# Patient Record
Sex: Male | Born: 1935 | Race: White | Hispanic: No | Marital: Married | Smoking: Former smoker
Health system: Southern US, Community
[De-identification: ages and names within clinical notes are randomized; demographics above are authoritative.]

## PROBLEM LIST (undated history)

## (undated) DIAGNOSIS — C159 Malignant neoplasm of esophagus, unspecified: Secondary | ICD-10-CM

## (undated) DIAGNOSIS — K219 Gastro-esophageal reflux disease without esophagitis: Secondary | ICD-10-CM

## (undated) DIAGNOSIS — H2701 Aphakia, right eye: Secondary | ICD-10-CM

## (undated) DIAGNOSIS — N400 Enlarged prostate without lower urinary tract symptoms: Secondary | ICD-10-CM

## (undated) DIAGNOSIS — E785 Hyperlipidemia, unspecified: Secondary | ICD-10-CM

## (undated) DIAGNOSIS — H3530 Unspecified macular degeneration: Secondary | ICD-10-CM

## (undated) DIAGNOSIS — H409 Unspecified glaucoma: Secondary | ICD-10-CM

## (undated) DIAGNOSIS — I771 Stricture of artery: Secondary | ICD-10-CM

## (undated) DIAGNOSIS — Z86718 Personal history of other venous thrombosis and embolism: Secondary | ICD-10-CM

## (undated) DIAGNOSIS — M1990 Unspecified osteoarthritis, unspecified site: Secondary | ICD-10-CM

## (undated) DIAGNOSIS — Z8673 Personal history of transient ischemic attack (TIA), and cerebral infarction without residual deficits: Secondary | ICD-10-CM

## (undated) DIAGNOSIS — I10 Essential (primary) hypertension: Secondary | ICD-10-CM

## (undated) DIAGNOSIS — I639 Cerebral infarction, unspecified: Secondary | ICD-10-CM

## (undated) DIAGNOSIS — Z9289 Personal history of other medical treatment: Secondary | ICD-10-CM

## (undated) DIAGNOSIS — M109 Gout, unspecified: Secondary | ICD-10-CM

## (undated) DIAGNOSIS — I2699 Other pulmonary embolism without acute cor pulmonale: Secondary | ICD-10-CM

## (undated) DIAGNOSIS — C801 Malignant (primary) neoplasm, unspecified: Secondary | ICD-10-CM

## (undated) HISTORY — DX: Unspecified glaucoma: H40.9

## (undated) HISTORY — DX: Benign prostatic hyperplasia without lower urinary tract symptoms: N40.0

## (undated) HISTORY — DX: Personal history of other medical treatment: Z92.89

## (undated) HISTORY — DX: Aphakia, right eye: H27.01

## (undated) HISTORY — DX: Essential (primary) hypertension: I10

## (undated) HISTORY — DX: Gastro-esophageal reflux disease without esophagitis: K21.9

## (undated) HISTORY — PX: CATARACT EXTRACTION: SUR2

## (undated) HISTORY — DX: Malignant neoplasm of esophagus, unspecified: C15.9

## (undated) HISTORY — DX: Cerebral infarction, unspecified: I63.9

## (undated) HISTORY — DX: Hyperlipidemia, unspecified: E78.5

## (undated) HISTORY — DX: Stricture of artery: I77.1

## (undated) HISTORY — DX: Personal history of other venous thrombosis and embolism: Z86.718

## (undated) HISTORY — DX: Unspecified macular degeneration: H35.30

## (undated) HISTORY — PX: FRACTURE SURGERY: SHX138

## (undated) HISTORY — DX: Personal history of transient ischemic attack (TIA), and cerebral infarction without residual deficits: Z86.73

## (undated) HISTORY — DX: Gout, unspecified: M10.9

## (undated) HISTORY — PX: MOHS SURGERY: SHX181

---

## 1998-03-31 ENCOUNTER — Ambulatory Visit (HOSPITAL_COMMUNITY): Admission: RE | Admit: 1998-03-31 | Discharge: 1998-03-31 | Payer: Self-pay | Admitting: Family Medicine

## 1998-10-03 ENCOUNTER — Ambulatory Visit (HOSPITAL_COMMUNITY): Admission: RE | Admit: 1998-10-03 | Discharge: 1998-10-03 | Payer: Self-pay | Admitting: Gastroenterology

## 2000-06-03 HISTORY — PX: COLONOSCOPY: SHX174

## 2006-02-13 ENCOUNTER — Other Ambulatory Visit: Payer: Self-pay

## 2006-02-13 ENCOUNTER — Inpatient Hospital Stay: Payer: Self-pay | Admitting: Internal Medicine

## 2009-09-02 DIAGNOSIS — Z9289 Personal history of other medical treatment: Secondary | ICD-10-CM

## 2009-09-02 HISTORY — DX: Personal history of other medical treatment: Z92.89

## 2012-12-01 ENCOUNTER — Encounter: Payer: Self-pay | Admitting: *Deleted

## 2012-12-02 ENCOUNTER — Encounter: Payer: Self-pay | Admitting: Cardiovascular Disease

## 2012-12-03 ENCOUNTER — Encounter: Payer: Self-pay | Admitting: Cardiovascular Disease

## 2012-12-03 ENCOUNTER — Ambulatory Visit (INDEPENDENT_AMBULATORY_CARE_PROVIDER_SITE_OTHER): Payer: Medicare Other | Admitting: Cardiovascular Disease

## 2012-12-03 VITALS — BP 140/82 | HR 51 | Ht 74.0 in | Wt 204.0 lb

## 2012-12-03 DIAGNOSIS — R0989 Other specified symptoms and signs involving the circulatory and respiratory systems: Secondary | ICD-10-CM

## 2012-12-03 DIAGNOSIS — E785 Hyperlipidemia, unspecified: Secondary | ICD-10-CM

## 2012-12-03 DIAGNOSIS — I739 Peripheral vascular disease, unspecified: Secondary | ICD-10-CM

## 2012-12-03 DIAGNOSIS — I10 Essential (primary) hypertension: Secondary | ICD-10-CM | POA: Insufficient documentation

## 2012-12-03 DIAGNOSIS — I771 Stricture of artery: Secondary | ICD-10-CM | POA: Insufficient documentation

## 2012-12-03 NOTE — Patient Instructions (Addendum)
  We will see you back in follow up in 1 year  Dr Allyson Sabal has ordered carotid and upper extremity dopplers on Monday

## 2012-12-03 NOTE — Assessment & Plan Note (Signed)
On antihypertensive medications with a well-controlled blood pressure though I'm unsure how accurate this is given his subclavian artery stenosis

## 2012-12-03 NOTE — Assessment & Plan Note (Signed)
Previous Dopplers performed 10/30/11 revealed what appeared to be an occluded left subclavian with retrograde left vertebral filling and high-grade right subclavian artery stenosis. He had mild carotid disease with less than 50%. He is neurologically asymptomatic and denies upper extremity claudication.

## 2012-12-03 NOTE — Assessment & Plan Note (Signed)
On statin therapy and niacin followed by his primary care physician

## 2012-12-03 NOTE — Progress Notes (Signed)
12/03/2012 Marcus [name]   [birth year]  [id number]  Primary Physician Pcp Not In System Primary Cardiologist: Runell Gess MD Marcus [name]   HPI:  The patient is a 79 year old, mild to moderately overweight, married, Caucasian male father of 4 stepchildren and 3 biologic children, grandfather to multiple grandchildren who I last saw a year ago. He has a 90-pack-year history of tobacco abuse, having quit 17 years ago, hypertension and hyperlipidemia, as well as a family history of heart disease. He has never had a heart attack or stroke and denies chest pain or shortness of breath. A Myoview stress test performed in our office September 26, 2009 was nonischemic. Dopplers revealed mild left ICA stenosis which has remained stable, occluded left subclavian artery with retrograde left vertebral filling, and moderately severe right subclavian artery stenosis. He has no symptoms of upper extremity claudication or subclavian steal symptoms. He does take his blood pressure in his right arm. Since I last saw him in the office 11/04/11 he has been completely asymptomatic. He specifically denies chest pain, shortness of breath, dizziness or upper extremity  Claudication.      Current Outpatient Prescriptions  Medication Sig Dispense Refill  . aspirin 81 MG tablet Take 81 mg by mouth daily.      . cyanocobalamin 500 MCG tablet Take 500 mcg by mouth daily.      . dorzolamide (TRUSOPT) 2 % ophthalmic solution Place 1 drop into both eyes 2 (two) times daily.      Marland Kitchen latanoprost (XALATAN) 0.005 % ophthalmic solution Place 1 drop into both eyes at bedtime.      . magnesium oxide (MAG-OX) 400 MG tablet Take 400 mg by mouth daily.      . niacin (NIASPAN) 1000 MG CR tablet Take 500 mg by mouth every other day.       . simvastatin (ZOCOR) 40 MG tablet Take 20 mg by mouth every other day.       . terazosin (HYTRIN) 10 MG capsule Take 10 mg by mouth at bedtime.      . Vitamin D, Ergocalciferol, (DRISDOL)  50000 UNITS CAPS Take 50,000 Units by mouth every 7 (seven) days.       No current facility-administered medications for this visit.    Allergies  Allergen Reactions  . Codeine Other (See Comments)    Swollen tongue, numb around mouth, slurred speech    History   Social History  . Marital Status: Married    Spouse Name: N/A    Number of Children: N/A  . Years of Education: N/A   Occupational History  . Not on file.   Social History Main Topics  . Smoking status: Former Smoker    Quit date: 06/03/1993  . Smokeless tobacco: Never Used  . Alcohol Use: No  . Drug Use: No  . Sexually Active: Not on file   Other Topics Concern  . Not on file   Social History Narrative  . No narrative on file     Review of Systems: General: negative for chills, fever, night sweats or weight changes.  Cardiovascular: negative for chest pain, dyspnea on exertion, edema, orthopnea, palpitations, paroxysmal nocturnal dyspnea or shortness of breath Dermatological: negative for rash Respiratory: negative for cough or wheezing Urologic: negative for hematuria Abdominal: negative for nausea, vomiting, diarrhea, bright red blood per rectum, melena, or hematemesis Neurologic: negative for visual changes, syncope, or dizziness All other systems reviewed and are otherwise negative except as noted above.  Blood pressure 140/82, pulse 51, height 6\' 2"  (1.88 m), weight 204 lb (92.534 kg).  General appearance: alert and no distress Neck: no adenopathy, no JVD, supple, symmetrical, trachea midline, thyroid not enlarged, symmetric, no tenderness/mass/nodules and soft right carotid and subclavian bruit Lungs: clear to auscultation bilaterally Heart: regular rate and rhythm, S1, S2 normal, no murmur, click, rub or gallop Extremities: extremities normal, atraumatic, no cyanosis or edema  EKG sinus bradycardia at 51 T wave inversion in leads 1 and L. Suggesting lateral ischemia unchanged from prior  EKGs  ASSESSMENT AND PLAN:   Subclavian artery stenosis Previous Dopplers performed 10/30/11 revealed what appeared to be an occluded left subclavian with retrograde left vertebral filling and high-grade right subclavian artery stenosis. He had mild carotid disease with less than 50%. He is neurologically asymptomatic and denies upper extremity claudication.  Essential hypertension On antihypertensive medications with a well-controlled blood pressure though I'm unsure how accurate this is given his subclavian artery stenosis  Hyperlipidemia On statin therapy and niacin followed by his primary care physician      Runell Gess MD [facility], [facility] 12/03/2012 11:02 AM

## 2012-12-07 ENCOUNTER — Ambulatory Visit (HOSPITAL_COMMUNITY)
Admission: RE | Admit: 2012-12-07 | Discharge: 2012-12-07 | Disposition: A | Discharge status: Home / Self Care | Payer: Medicare Other | Source: Ambulatory Visit | Attending: Cardiovascular Disease | Admitting: Cardiovascular Disease

## 2012-12-07 DIAGNOSIS — I771 Stricture of artery: Secondary | ICD-10-CM

## 2012-12-07 DIAGNOSIS — I739 Peripheral vascular disease, unspecified: Secondary | ICD-10-CM

## 2012-12-07 DIAGNOSIS — R0989 Other specified symptoms and signs involving the circulatory and respiratory systems: Secondary | ICD-10-CM

## 2012-12-07 NOTE — Progress Notes (Signed)
Upper Extremity Arterial Duplex Completed. °Marcus [name] ° °

## 2012-12-11 ENCOUNTER — Telehealth: Payer: Self-pay | Admitting: *Deleted

## 2012-12-11 NOTE — Telephone Encounter (Signed)
Message copied by Barrie Dunker on Fri Dec 11, 2012  2:17 PM ------      Message from: Cleon Gustin D      Created: Fri Dec 11, 2012  8:15 AM      Regarding: Carotid test       Patient has been scheduled for a carotid test. He thinks that the Upper Arterial doppler is the same test.      He would like to speak with Dr. Allyson Sabal or his nurse to discuss if he really needs to have the carotid.            Ebony ------

## 2012-12-11 NOTE — Telephone Encounter (Signed)
Return call to patient, no answer leave message for patient to call back.

## 2013-02-10 ENCOUNTER — Encounter (HOSPITAL_COMMUNITY): Payer: Medicare Other

## 2013-03-29 ENCOUNTER — Encounter (HOSPITAL_COMMUNITY): Payer: Medicare Other

## 2013-05-03 ENCOUNTER — Ambulatory Visit (HOSPITAL_COMMUNITY)
Admission: RE | Admit: 2013-05-03 | Discharge: 2013-05-03 | Disposition: A | Discharge status: Home / Self Care | Payer: Medicare Other | Source: Ambulatory Visit | Attending: Cardiovascular Disease | Admitting: Cardiovascular Disease

## 2013-05-03 DIAGNOSIS — R0989 Other specified symptoms and signs involving the circulatory and respiratory systems: Secondary | ICD-10-CM

## 2013-05-03 DIAGNOSIS — I739 Peripheral vascular disease, unspecified: Secondary | ICD-10-CM

## 2013-05-03 DIAGNOSIS — I6529 Occlusion and stenosis of unspecified carotid artery: Secondary | ICD-10-CM

## 2013-05-03 NOTE — Progress Notes (Signed)
Carotid Duplex Completed. Mya Suell, BS, RDMS, RVT  

## 2013-05-10 ENCOUNTER — Encounter: Payer: Self-pay | Admitting: *Deleted

## 2013-05-10 ENCOUNTER — Telehealth: Payer: Self-pay | Admitting: *Deleted

## 2013-05-10 DIAGNOSIS — I6529 Occlusion and stenosis of unspecified carotid artery: Secondary | ICD-10-CM

## 2013-05-10 NOTE — Telephone Encounter (Signed)
Order placed for repeat carotid doppler in 1 year 

## 2013-05-10 NOTE — Telephone Encounter (Signed)
Message copied by Marella Bile on Mon May 10, 2013 12:54 PM ------      Message from: Runell Gess      Created: Sun May 09, 2013  7:02 PM       No change from prior study. Repeat in 12 months. ------

## 2013-09-14 ENCOUNTER — Other Ambulatory Visit: Payer: Self-pay

## 2013-10-12 ENCOUNTER — Other Ambulatory Visit: Payer: Self-pay | Admitting: Dermatology

## 2014-08-03 ENCOUNTER — Other Ambulatory Visit: Payer: Self-pay | Admitting: Dermatology

## 2014-12-24 ENCOUNTER — Inpatient Hospital Stay (HOSPITAL_COMMUNITY)
Admission: AD | Admit: 2014-12-24 | Discharge: 2014-12-26 | DRG: 066 | Disposition: A | Discharge status: Home / Self Care | Payer: Medicare Other | Source: Other Acute Inpatient Hospital | Attending: Internal Medicine | Admitting: Internal Medicine

## 2014-12-24 ENCOUNTER — Encounter (HOSPITAL_COMMUNITY): Payer: Self-pay | Admitting: *Deleted

## 2014-12-24 ENCOUNTER — Inpatient Hospital Stay (HOSPITAL_COMMUNITY): Payer: Medicare Other

## 2014-12-24 DIAGNOSIS — H409 Unspecified glaucoma: Secondary | ICD-10-CM | POA: Diagnosis present

## 2014-12-24 DIAGNOSIS — I671 Cerebral aneurysm, nonruptured: Secondary | ICD-10-CM | POA: Diagnosis present

## 2014-12-24 DIAGNOSIS — Z8249 Family history of ischemic heart disease and other diseases of the circulatory system: Secondary | ICD-10-CM | POA: Diagnosis not present

## 2014-12-24 DIAGNOSIS — N400 Enlarged prostate without lower urinary tract symptoms: Secondary | ICD-10-CM | POA: Diagnosis present

## 2014-12-24 DIAGNOSIS — I6330 Cerebral infarction due to thrombosis of unspecified cerebral artery: Secondary | ICD-10-CM | POA: Diagnosis present

## 2014-12-24 DIAGNOSIS — I10 Essential (primary) hypertension: Secondary | ICD-10-CM | POA: Diagnosis present

## 2014-12-24 DIAGNOSIS — H5441 Blindness, right eye, normal vision left eye: Secondary | ICD-10-CM | POA: Diagnosis not present

## 2014-12-24 DIAGNOSIS — E875 Hyperkalemia: Secondary | ICD-10-CM | POA: Diagnosis present

## 2014-12-24 DIAGNOSIS — I639 Cerebral infarction, unspecified: Secondary | ICD-10-CM | POA: Diagnosis present

## 2014-12-24 DIAGNOSIS — Z7982 Long term (current) use of aspirin: Secondary | ICD-10-CM | POA: Diagnosis not present

## 2014-12-24 DIAGNOSIS — I771 Stricture of artery: Secondary | ICD-10-CM | POA: Diagnosis present

## 2014-12-24 DIAGNOSIS — Z79899 Other long term (current) drug therapy: Secondary | ICD-10-CM | POA: Diagnosis not present

## 2014-12-24 DIAGNOSIS — N289 Disorder of kidney and ureter, unspecified: Secondary | ICD-10-CM | POA: Diagnosis not present

## 2014-12-24 DIAGNOSIS — M1A9XX1 Chronic gout, unspecified, with tophus (tophi): Secondary | ICD-10-CM | POA: Diagnosis present

## 2014-12-24 DIAGNOSIS — I6789 Other cerebrovascular disease: Secondary | ICD-10-CM | POA: Diagnosis not present

## 2014-12-24 DIAGNOSIS — Z888 Allergy status to other drugs, medicaments and biological substances status: Secondary | ICD-10-CM | POA: Diagnosis not present

## 2014-12-24 DIAGNOSIS — E785 Hyperlipidemia, unspecified: Secondary | ICD-10-CM | POA: Diagnosis not present

## 2014-12-24 DIAGNOSIS — Z87891 Personal history of nicotine dependence: Secondary | ICD-10-CM

## 2014-12-24 DIAGNOSIS — Z885 Allergy status to narcotic agent status: Secondary | ICD-10-CM | POA: Diagnosis not present

## 2014-12-24 DIAGNOSIS — R202 Paresthesia of skin: Secondary | ICD-10-CM

## 2014-12-24 DIAGNOSIS — I6502 Occlusion and stenosis of left vertebral artery: Secondary | ICD-10-CM | POA: Diagnosis present

## 2014-12-24 MED ORDER — NIACIN ER (ANTIHYPERLIPIDEMIC) 1000 MG PO TBCR: 500.0000 mg | EXTENDED_RELEASE_TABLET | ORAL | Status: DC

## 2014-12-24 MED ORDER — STROKE: EARLY STAGES OF RECOVERY BOOK: Freq: Once | Status: DC

## 2014-12-24 MED ORDER — PNEUMOCOCCAL VAC POLYVALENT 25 MCG/0.5ML IJ INJ
0.5000 mL | INJECTION | INTRAMUSCULAR | Status: AC
  Administered 2014-12-25: 0.5 mL via INTRAMUSCULAR
  Filled 2014-12-24: qty 0.5

## 2014-12-24 MED ORDER — FEBUXOSTAT 40 MG PO TABS
40.0000 mg | ORAL_TABLET | Freq: Every day | ORAL | Status: DC
  Administered 2014-12-24 – 2014-12-26 (×3): 40 mg via ORAL
  Filled 2014-12-24 (×3): qty 1

## 2014-12-24 MED ORDER — ACETAMINOPHEN 650 MG RE SUPP: 650.0000 mg | RECTAL | Status: DC | PRN

## 2014-12-24 MED ORDER — ACETAMINOPHEN 325 MG PO TABS: 650.0000 mg | ORAL_TABLET | ORAL | Status: DC | PRN

## 2014-12-24 MED ORDER — VITAMIN B-12 100 MCG PO TABS
500.0000 ug | ORAL_TABLET | Freq: Every day | ORAL | Status: DC
  Administered 2014-12-24 – 2014-12-26 (×3): 500 ug via ORAL
  Filled 2014-12-24 (×3): qty 5

## 2014-12-24 MED ORDER — LATANOPROST 0.005 % OP SOLN
1.0000 [drp] | Freq: Every day | OPHTHALMIC | Status: DC
  Administered 2014-12-24 – 2014-12-25 (×2): 1 [drp] via OPHTHALMIC
  Filled 2014-12-24: qty 2.5

## 2014-12-24 MED ORDER — SENNOSIDES-DOCUSATE SODIUM 8.6-50 MG PO TABS: 1.0000 | ORAL_TABLET | Freq: Every evening | ORAL | Status: DC | PRN

## 2014-12-24 MED ORDER — DORZOLAMIDE HCL 2 % OP SOLN
1.0000 [drp] | Freq: Two times a day (BID) | OPHTHALMIC | Status: DC
  Administered 2014-12-24 – 2014-12-26 (×4): 1 [drp] via OPHTHALMIC
  Filled 2014-12-24: qty 10

## 2014-12-24 MED ORDER — FINASTERIDE 5 MG PO TABS
5.0000 mg | ORAL_TABLET | Freq: Every day | ORAL | Status: DC
  Administered 2014-12-24 – 2014-12-26 (×3): 5 mg via ORAL
  Filled 2014-12-24 (×3): qty 1

## 2014-12-24 MED ORDER — HEPARIN SODIUM (PORCINE) 5000 UNIT/ML IJ SOLN
5000.0000 [IU] | Freq: Three times a day (TID) | INTRAMUSCULAR | Status: DC
  Administered 2014-12-24 – 2014-12-26 (×5): 5000 [IU] via SUBCUTANEOUS
  Filled 2014-12-24 (×5): qty 1

## 2014-12-24 MED ORDER — ASPIRIN 81 MG PO CHEW
81.0000 mg | CHEWABLE_TABLET | Freq: Every day | ORAL | Status: DC
  Administered 2014-12-25 – 2014-12-26 (×2): 81 mg via ORAL
  Filled 2014-12-24 (×2): qty 1

## 2014-12-24 MED ORDER — MELOXICAM 7.5 MG PO TABS: 7.5000 mg | ORAL_TABLET | Freq: Every day | ORAL | Status: DC | PRN

## 2014-12-24 MED ORDER — TIMOLOL MALEATE 0.5 % OP SOLN
1.0000 [drp] | Freq: Two times a day (BID) | OPHTHALMIC | Status: DC
  Administered 2014-12-24 – 2014-12-26 (×4): 1 [drp] via OPHTHALMIC
  Filled 2014-12-24: qty 5

## 2014-12-24 NOTE — Progress Notes (Deleted)
[facility] History and Physical  ARLEIGH ODOWD GHW:[id number] DOB: [birth year] DOA: 12/24/2014  Referring physician: Duke Salvia PCP: Deveron Furlong   Chief Complaint: Pins and needles right side  HPI: Marcus [name] is a 81 y.o. male  With h/o HTN, HLD, BPH, tophaceous gout transferred from [facility] with right sided numbness and tingling.  At about 11:30 pm last night, developed right sided parasthesias involving arm and leg.  Also burning sensation left eye and mouth.  Went to ED in [facility] last night at past midnight. Evaluated in ED, evaluated by teleneurologist, and felt to not be a TPA candidate due to mild sypmptoms.  No weakness. No slurred speech or difficulty swallowing. CT brain negative. Creatinine 2.0. Baseline unknown. Requested transfer to [facility], as family in [place], and no MRI, echo, etc available at outside hospital until Monday.    Review of Systems:  Complete systems review. Negative except as above.  Past Medical History  Diagnosis Date  . Hypertension   . Hyperlipemia   . History of stress test 09/02/2009    abnormal myocardial perfusion scan demonstrating an attenuation defect in the inferior region of the myocardium. No ischemia or infarct/scar is seen in the remaining myocardium. No prior study available for comparison. Abnormal myocardial study EF 79%  . History of Doppler ultrasound     dopplers revealed mild left ICA stenosis which has remained stable, occluded left subclavian artery with retrograde left vertebral filling and moderately severe right subclavian artery stenosis. he has no symptoms of upper extermity claudication or subclavian steal symptoms.   . Subclavian artery stenosis    Past Surgical History  Procedure Laterality Date  . Cataract extraction     Social History:  reports that he quit smoking about 21 years ago. He has never used smokeless tobacco. He reports that he does not drink alcohol or use illicit  drugs.  Allergies  Allergen Reactions  . Codeine Other (See Comments)    Swollen tongue, numb around mouth, slurred speech  . Simvastatin Other (See Comments)    myalgias    Family History  Problem Relation Age of Onset  . Heart disease Brother   . Cancer - Colon Sister   . Cancer Sister   . Other Brother     CABG    Prior to Admission medications   Medication Sig Start Date End Date Taking? Authorizing Provider  amLODipine (NORVASC) 5 MG tablet Take 5 mg by mouth at bedtime. 12/21/14 12/21/15 Yes Historical Provider, MD  meloxicam (MOBIC) 7.5 MG tablet Take 7.5 mg by mouth daily as needed. For pain 11/01/14  Yes Historical Provider, MD  amLODipine (NORVASC) 5 MG tablet Take 5 mg by mouth daily. 12/21/14   Historical Provider, MD  aspirin 81 MG chewable tablet Chew 81 mg by mouth daily.    Historical Provider, MD  aspirin 81 MG tablet Take 81 mg by mouth daily.    Historical Provider, MD  cyanocobalamin 500 MCG tablet Take 500 mcg by mouth daily.    Historical Provider, MD  dorzolamide (TRUSOPT) 2 % ophthalmic solution Place 1 drop into both eyes 2 (two) times daily.    Historical Provider, MD  finasteride (PROSCAR) 5 MG tablet Take 5 mg by mouth daily. 12/06/14   Historical Provider, MD  finasteride (PROSCAR) 5 MG tablet Take 5 mg by mouth daily.    Historical Provider, MD  latanoprost (XALATAN) 0.005 % ophthalmic solution Place 1 drop into both eyes at bedtime.  Historical Provider, MD  losartan (COZAAR) 100 MG tablet Take 100 mg by mouth daily. 12/07/14   Historical Provider, MD  magnesium gluconate (MAGONATE) 500 MG tablet Take 500 mg by mouth daily.    Historical Provider, MD  magnesium oxide (MAG-OX) 400 MG tablet Take 400 mg by mouth daily.    Historical Provider, MD  meloxicam (MOBIC) 7.5 MG tablet Take 7.5 mg by mouth daily as needed. For pain 11/01/14   Historical Provider, MD  mupirocin ointment (BACTROBAN) 2 % Apply 1 application topically daily. 11/09/14   Historical Provider,  MD  niacin (NIASPAN) 1000 MG CR tablet Take 500 mg by mouth every other day.     Historical Provider, MD  simvastatin (ZOCOR) 40 MG tablet Take 20 mg by mouth every other day.     Historical Provider, MD  terazosin (HYTRIN) 10 MG capsule Take 10 mg by mouth at bedtime.    Historical Provider, MD  timolol (TIMOPTIC) 0.5 % ophthalmic solution Place 1 drop into both eyes 2 (two) times daily. 10/27/14   Historical Provider, MD  ULORIC 40 MG tablet Take 40 mg by mouth daily. To prevent gout 11/01/14   Historical Provider, MD  Vitamin D, Ergocalciferol, (DRISDOL) 50000 UNITS CAPS Take 50,000 Units by mouth every 7 (seven) days.    Historical Provider, MD   Physical Exam: Filed Vitals:   12/24/14 1541 12/24/14 1700  BP: 165/67 182/65  Pulse: 62 57  Temp: 97 F (36.1 C) 97.8 F (36.6 C)  TempSrc: Oral Oral  Resp: 18 20  SpO2: 97% 95%    Wt Readings from Last 3 Encounters:  12/03/12 92.534 kg (204 lb)    BP 182/65 mmHg  Pulse 57  Temp(Src) 97.8 F (36.6 C) (Oral)  Resp 20  SpO2 95%  General Appearance:    Alert, cooperative, no distress, appears stated age  Head:    Normocephalic, without obvious abnormality, atraumatic  Eyes:    PERRL, conjunctiva/corneas clear, EOM's intact     Nose:   Nares normal, septum midline, mucosa normal, no drainage   or sinus tenderness  Throat:   Lips, mucosa, and tongue normal; teeth and gums normal  Neck:   Supple, symmetrical, trachea midline, no adenopathy;       thyroid:  No enlargement/tenderness/nodules; right carotid bruit present. noJVD  Back:     Symmetric, no curvature, ROM normal, no CVA tenderness  Lungs:     Clear to auscultation bilaterally, respirations unlabored  Chest wall:    No tenderness or deformity  Heart:    Regular rate and rhythm, S1 and S2 normal, no murmur, rub   or gallop  Abdomen:     Soft, non-tender, bowel sounds active all four quadrants,    no masses, no organomegaly  Genitalia:   Deferred  Rectal:   Deferred    Extremities:   No edema. muptiple tophi  Pulses:   2+ and symmetric all extremities  Skin:   Skin color, texture, turgor normal, no rashes or lesions  Lymph nodes:   Cervical, supraclavicular, and axillary nodes normal  Neurologic:   CNII-XII intact. Normal strength, decreased sensation RUE, RLE. strenth 5/5 throughout             Psych: normal affect Labs on Admission:  Glucose 113, BUN 42, Creatinine 2, WBC 5.2, h/h 12.5/38.1, plt 144,000  Radiological Exams on Admission: CT brain without contrast impression: nothing acute  EKG: not in chart  Assessment/Plan Principal Problem:   Paresthesias of right arma  and leg: suspect stroke. MRI, MRA, echo, carotid dopplers, echo hgbA1c, FLP, continue ASA for now Active Problems:   Subclavian artery stenosis   Essential hypertension: permissive hypertension   Hyperlipidemia: stopped simvastatin due to myalgia   Renal insufficiency: baseline creatinine unknown   Glaucoma: continue drops   BPH (benign prostatic hyperplasia): continue outpt meds   Tophaceous gout: cont uloric   Code Status: full Family Communication: multiple at bedside Disposition Plan: anticipate 2-3 day stay, then home Time spent: 60 min  [facility]

## 2014-12-25 ENCOUNTER — Inpatient Hospital Stay (HOSPITAL_COMMUNITY): Payer: Self-pay

## 2014-12-25 ENCOUNTER — Inpatient Hospital Stay (HOSPITAL_COMMUNITY): Payer: Medicare Other

## 2014-12-25 DIAGNOSIS — N289 Disorder of kidney and ureter, unspecified: Secondary | ICD-10-CM

## 2014-12-25 DIAGNOSIS — E785 Hyperlipidemia, unspecified: Secondary | ICD-10-CM

## 2014-12-25 DIAGNOSIS — I6330 Cerebral infarction due to thrombosis of unspecified cerebral artery: Secondary | ICD-10-CM

## 2014-12-25 DIAGNOSIS — I639 Cerebral infarction, unspecified: Secondary | ICD-10-CM | POA: Diagnosis not present

## 2014-12-25 LAB — CBC
HCT: 39.3 % (ref 39.0–52.0)
Hemoglobin: 13.5 g/dL (ref 13.0–17.0)
MCH: 33.8 pg (ref 26.0–34.0)
MCHC: 34.4 g/dL (ref 30.0–36.0)
MCV: 98.5 fL (ref 78.0–100.0)
PLATELETS: 155 10*3/uL (ref 150–400)
RBC: 3.99 MIL/uL — ABNORMAL LOW (ref 4.22–5.81)
RDW: 12.8 % (ref 11.5–15.5)
WBC: 4.3 10*3/uL (ref 4.0–10.5)

## 2014-12-25 LAB — LIPID PANEL
Cholesterol: 209 mg/dL — ABNORMAL HIGH (ref 0–200)
HDL: 30 mg/dL — ABNORMAL LOW (ref >40)
LDL Cholesterol: 136 mg/dL — ABNORMAL HIGH (ref 0–99)
TRIGLYCERIDES: 215 mg/dL — AB (ref <150)
Total CHOL/HDL Ratio: 7 RATIO
VLDL: 43 mg/dL — ABNORMAL HIGH (ref 0–40)

## 2014-12-25 LAB — COMPREHENSIVE METABOLIC PANEL
ALT: 12 U/L — ABNORMAL LOW (ref 17–63)
AST: 18 U/L (ref 15–41)
Albumin: 3.3 g/dL — ABNORMAL LOW (ref 3.5–5.0)
Alkaline Phosphatase: 82 U/L (ref 38–126)
Anion gap: 8 (ref 5–15)
BUN: 29 mg/dL — AB (ref 6–20)
CALCIUM: 9.3 mg/dL (ref 8.9–10.3)
CO2: 20 mmol/L — ABNORMAL LOW (ref 22–32)
Chloride: 109 mmol/L (ref 101–111)
Creatinine, Ser: 1.34 mg/dL — ABNORMAL HIGH (ref 0.61–1.24)
GFR calc Af Amer: 56 mL/min — ABNORMAL LOW (ref >60)
GFR calc non Af Amer: 48 mL/min — ABNORMAL LOW (ref >60)
GLUCOSE: 107 mg/dL — AB (ref 65–99)
Potassium: 5.2 mmol/L — ABNORMAL HIGH (ref 3.5–5.1)
SODIUM: 137 mmol/L (ref 135–145)
Total Bilirubin: 0.5 mg/dL (ref 0.3–1.2)
Total Protein: 6.9 g/dL (ref 6.5–8.1)

## 2014-12-25 NOTE — H&P (Signed)
Delfina Redwood, MD Physician Signed Internal Medicine Progress Notes 12/24/2014 6:23 PM    Expand All Collapse All    [facility] History and Physical  Marcus [name] YIF:[id number] DOB: 1931-07-02 DOA: 12/24/2014  Referring physician: Duke Salvia PCP: Deveron Furlong   Chief Complaint: Pins and needles right side  HPI: Marcus [name] is a 81 y.o. male  With h/o HTN, HLD, BPH, tophaceous gout transferred from [facility] with right sided numbness and tingling. At about 11:30 pm last night, developed right sided parasthesias involving arm and leg. Also burning sensation left eye and mouth. Went to ED in [facility] last night at past midnight. Evaluated in ED, evaluated by teleneurologist, and felt to not be a TPA candidate due to mild sypmptoms. No weakness. No slurred speech or difficulty swallowing. CT brain negative. Creatinine 2.0. Baseline unknown. Requested transfer to [facility], as family in [place], and no MRI, echo, etc available at outside hospital until Monday.    Review of Systems:  Complete systems review. Negative except as above.  Past Medical History  Diagnosis Date  . Hypertension   . Hyperlipemia   . History of stress test 09/02/2009    abnormal myocardial perfusion scan demonstrating an attenuation defect in the inferior region of the myocardium. No ischemia or infarct/scar is seen in the remaining myocardium. No prior study available for comparison. Abnormal myocardial study EF 79%  . History of Doppler ultrasound     dopplers revealed mild left ICA stenosis which has remained stable, occluded left subclavian artery with retrograde left vertebral filling and moderately severe right subclavian artery stenosis. he has no symptoms of upper extermity claudication or subclavian steal symptoms.   . Subclavian artery stenosis    Past Surgical History  Procedure Laterality Date  . Cataract extraction      Social History:  reports that he quit smoking about 21 years ago. He has never used smokeless tobacco. He reports that he does not drink alcohol or use illicit drugs.  Allergies  Allergen Reactions  . Codeine Other (See Comments)    Swollen tongue, numb around mouth, slurred speech  . Simvastatin Other (See Comments)    myalgias    Family History  Problem Relation Age of Onset  . Heart disease Brother   . Cancer - Colon Sister   . Cancer Sister   . Other Brother     CABG    Prior to Admission medications   Medication Sig Start Date End Date Taking? Authorizing Provider  amLODipine (NORVASC) 5 MG tablet Take 5 mg by mouth at bedtime. 12/21/14 12/21/15 Yes Historical Provider, MD  meloxicam (MOBIC) 7.5 MG tablet Take 7.5 mg by mouth daily as needed. For pain 11/01/14  Yes Historical Provider, MD  amLODipine (NORVASC) 5 MG tablet Take 5 mg by mouth daily. 12/21/14   Historical Provider, MD  aspirin 81 MG chewable tablet Chew 81 mg by mouth daily.    Historical Provider, MD  aspirin 81 MG tablet Take 81 mg by mouth daily.    Historical Provider, MD  cyanocobalamin 500 MCG tablet Take 500 mcg by mouth daily.    Historical Provider, MD  dorzolamide (TRUSOPT) 2 % ophthalmic solution Place 1 drop into both eyes 2 (two) times daily.    Historical Provider, MD  finasteride (PROSCAR) 5 MG tablet Take 5 mg by mouth daily. 12/06/14   Historical Provider, MD  finasteride (PROSCAR) 5 MG tablet Take 5 mg by mouth daily.  Historical Provider, MD  latanoprost (XALATAN) 0.005 % ophthalmic solution Place 1 drop into both eyes at bedtime.    Historical Provider, MD  losartan (COZAAR) 100 MG tablet Take 100 mg by mouth daily. 12/07/14   Historical Provider, MD  magnesium gluconate (MAGONATE) 500 MG tablet Take 500 mg by mouth daily.    Historical Provider, MD  magnesium oxide (MAG-OX)  400 MG tablet Take 400 mg by mouth daily.    Historical Provider, MD  meloxicam (MOBIC) 7.5 MG tablet Take 7.5 mg by mouth daily as needed. For pain 11/01/14   Historical Provider, MD  mupirocin ointment (BACTROBAN) 2 % Apply 1 application topically daily. 11/09/14   Historical Provider, MD  niacin (NIASPAN) 1000 MG CR tablet Take 500 mg by mouth every other day.     Historical Provider, MD  simvastatin (ZOCOR) 40 MG tablet Take 20 mg by mouth every other day.     Historical Provider, MD  terazosin (HYTRIN) 10 MG capsule Take 10 mg by mouth at bedtime.    Historical Provider, MD  timolol (TIMOPTIC) 0.5 % ophthalmic solution Place 1 drop into both eyes 2 (two) times daily. 10/27/14   Historical Provider, MD  ULORIC 40 MG tablet Take 40 mg by mouth daily. To prevent gout 11/01/14   Historical Provider, MD  Vitamin D, Ergocalciferol, (DRISDOL) 50000 UNITS CAPS Take 50,000 Units by mouth every 7 (seven) days.    Historical Provider, MD   Physical Exam: Filed Vitals:   12/24/14 1541 12/24/14 1700  BP: 165/67 182/65  Pulse: 62 57  Temp: 97 F (36.1 C) 97.8 F (36.6 C)  TempSrc: Oral Oral  Resp: 18 20  SpO2: 97% 95%    Wt Readings from Last 3 Encounters:  12/03/12 92.534 kg (204 lb)    BP 182/65 mmHg  Pulse 57  Temp(Src) 97.8 F (36.6 C) (Oral)  Resp 20  SpO2 95%  General Appearance:   Alert, cooperative, no distress, appears stated age  Head:   Normocephalic, without obvious abnormality, atraumatic  Eyes:   PERRL, conjunctiva/corneas clear, EOM's intact   Nose:  Nares normal, septum midline, mucosa normal, no drainage  or sinus tenderness  Throat:  Lips, mucosa, and tongue normal; teeth and gums normal  Neck:  Supple, symmetrical, trachea midline, no adenopathy;   thyroid: No enlargement/tenderness/nodules; right carotid bruit present. noJVD  Back:   Symmetric, no  curvature, ROM normal, no CVA tenderness  Lungs:   Clear to auscultation bilaterally, respirations unlabored  Chest wall:   No tenderness or deformity  Heart:   Regular rate and rhythm, S1 and S2 normal, no murmur, rub  or gallop  Abdomen:   Soft, non-tender, bowel sounds active all four quadrants,   no masses, no organomegaly  Genitalia:  Deferred  Rectal:  Deferred  Extremities:  No edema. muptiple tophi  Pulses:  2+ and symmetric all extremities  Skin:  Skin color, texture, turgor normal, no rashes or lesions  Lymph nodes:  Cervical, supraclavicular, and axillary nodes normal  Neurologic:  CNII-XII intact. Normal strength, decreased sensation RUE, RLE. strenth 5/5 throughout           Psych: normal affect Labs on Admission:  Glucose 113, BUN 42, Creatinine 2, WBC 5.2, h/h 12.5/38.1, plt 144,000  Radiological Exams on Admission: CT brain without contrast impression: nothing acute  EKG: not in chart  Assessment/Plan Principal Problem:  Paresthesias of right arma and leg: suspect stroke. MRI, MRA, echo, carotid dopplers, echo hgbA1c, FLP, continue ASA  for now Active Problems:  Subclavian artery stenosis  Essential hypertension: permissive hypertension  Hyperlipidemia: stopped simvastatin due to myalgia  Renal insufficiency: baseline creatinine unknown  Glaucoma: continue drops  BPH (benign prostatic hyperplasia): continue outpt meds  Tophaceous gout: cont uloric   Code Status: full Family Communication: multiple at bedside Disposition Plan: anticipate 2-3 day stay, then home Time spent: 60 min  [facility]

## 2014-12-25 NOTE — Progress Notes (Signed)
[facility] PROGRESS NOTE  ELIM ECONOMOU ASN:[id number] DOB: 09/21/1931 DOA: 12/24/2014 PCP: Pcp Not In System  Assessment/Plan:  Principal Problem:   Cerebral thrombosis with cerebral infarction: MRI confirms small left paracentral pontine infarct. MRA Left vertebral artery is occluded.  Mild to moderate narrowing proximal to mid aspect of the basilar artery.  Nonvisualized anterior inferior cerebellar arteries.  Mild narrowing P1 segment left posterior cerebral artery and posterior cerebral artery distal branches bilaterally.  Bulges of the cavernous segment of the internal carotid artery bilaterally probably reflect result of atherosclerotic type changes although difficult to completely exclude small aneurysms measuring up to 3.5 mm.  Mild narrowing middle cerebral artery branch vessels. Echo, carotid dopplers labs pending. PT, OT. On ASA PTA, continued. Consulted neurology Active Problems:   Subclavian artery stenosis   Essential hypertension   Hyperlipidemia: had intolerance to simvastatin.  Await FLP   Paresthesias of right arma and leg   Renal insufficiency   Glaucoma   BPH (benign prostatic hyperplasia)   Tophaceous gout   HPI/Subjective: Maybe slight improvement in parasthesias. No other complaints  Objective: Filed Vitals:   12/25/14 0543  BP: 157/52  Pulse: 54  Temp: 98.2 F (36.8 C)  Resp: 18    Intake/Output Summary (Last 24 hours) at 12/25/14 0836 Last data filed at 12/25/14 0545  Gross per 24 hour  Intake      0 ml  Output    450 ml  Net   -450 ml   Filed Weights   12/24/14 2000  Weight: 92.262 kg (203 lb 6.4 oz)    Exam:   General:  Alert, oriented  Cardiovascular: Regular rate rhythm without murmurs gallops rubs  Respiratory: Clear to auscultation bilaterally without wheezes rhonchi or rales  Abdomen: Soft nontender nondistended  Ext: No clubbing cyanosis or edema  Neurologic: Decreased sensation reported right face and right upper  extremity lower extremity. Motor strength remains normal.  Basic Metabolic Panel: No results for input(s): NA, K, CL, CO2, GLUCOSE, BUN, CREATININE, CALCIUM, MG, PHOS in the last 168 hours. Liver Function Tests: No results for input(s): AST, ALT, ALKPHOS, BILITOT, PROT, ALBUMIN in the last 168 hours. No results for input(s): LIPASE, AMYLASE in the last 168 hours. No results for input(s): AMMONIA in the last 168 hours. CBC: No results for input(s): WBC, NEUTROABS, HGB, HCT, MCV, PLT in the last 168 hours. Cardiac Enzymes: No results for input(s): CKTOTAL, CKMB, CKMBINDEX, TROPONINI in the last 168 hours. BNP (last 3 results) No results for input(s): BNP in the last 8760 hours.  ProBNP (last 3 results) No results for input(s): PROBNP in the last 8760 hours.  CBG: No results for input(s): GLUCAP in the last 168 hours.  No results found for this or any previous visit (from the past 240 hour(s)).   Studies: Mr [name] Contrast  12/24/2014   CLINICAL DATA:  81 year old hypertensive male with right-sided weakness. Initial encounter.  EXAM: MRI HEAD WITHOUT CONTRAST  MRA HEAD WITHOUT CONTRAST  TECHNIQUE: Multiplanar, multiecho pulse sequences of the brain and surrounding structures were obtained without intravenous contrast. Angiographic images of the head were obtained using MRA technique without contrast.  COMPARISON:  None.  FINDINGS: MRI HEAD FINDINGS  Acute small nonhemorrhagic left paracentral pontine infarct.  No intracranial hemorrhage.  Mild to moderate small vessel disease type changes.  Global atrophy. Ventricular prominence felt to be related to atrophy rather hydrocephalus.  No intracranial mass lesion noted on this unenhanced exam.  Left vertebral artery not visualized.  Post lens replacement.  Elongated globes.  Sclerotic appearance right wrist C3-4 facet joint suggestive of degenerative changes.  MRA HEAD FINDINGS  Left vertebral artery is occluded.  Ectatic right vertebral artery.   Mild to moderate narrowing proximal to mid aspect of the basilar artery.  Nonvisualized anterior inferior cerebellar arteries.  Mild narrowing P1 segment left posterior cerebral artery and posterior cerebral artery distal branches bilaterally.  Bulges of the cavernous segment of the internal carotid artery bilaterally may reflect result of atherosclerotic type changes although difficult to completely exclude small aneurysms measuring up to 3.5 mm.  Feel type contribution to the posterior cerebral arteries bilaterally.  Mild narrowing middle cerebral artery branch vessels.  IMPRESSION: MRI HEAD  Acute small nonhemorrhagic left paracentral pontine infarct.  No intracranial hemorrhage.  Mild to moderate small vessel disease type changes.  Global atrophy.  MRA HEAD  Left vertebral artery is occluded.  Mild to moderate narrowing proximal to mid aspect of the basilar artery.  Nonvisualized anterior inferior cerebellar arteries.  Mild narrowing P1 segment left posterior cerebral artery and posterior cerebral artery distal branches bilaterally.  Bulges of the cavernous segment of the internal carotid artery bilaterally probably reflect result of atherosclerotic type changes although difficult to completely exclude small aneurysms measuring up to 3.5 mm.  Mild narrowing middle cerebral artery branch vessels.   Electronically Signed   By: Genia Del M.D.   On: 12/24/2014 20:18   Mr Jodene Nam Head/brain Wo Cm  12/24/2014   CLINICAL DATA:  81 year old hypertensive male with right-sided weakness. Initial encounter.  EXAM: MRI HEAD WITHOUT CONTRAST  MRA HEAD WITHOUT CONTRAST  TECHNIQUE: Multiplanar, multiecho pulse sequences of the brain and surrounding structures were obtained without intravenous contrast. Angiographic images of the head were obtained using MRA technique without contrast.  COMPARISON:  None.  FINDINGS: MRI HEAD FINDINGS  Acute small nonhemorrhagic left paracentral pontine infarct.  No intracranial hemorrhage.   Mild to moderate small vessel disease type changes.  Global atrophy. Ventricular prominence felt to be related to atrophy rather hydrocephalus.  No intracranial mass lesion noted on this unenhanced exam.  Left vertebral artery not visualized.  Post lens replacement.  Elongated globes.  Sclerotic appearance right wrist C3-4 facet joint suggestive of degenerative changes.  MRA HEAD FINDINGS  Left vertebral artery is occluded.  Ectatic right vertebral artery.  Mild to moderate narrowing proximal to mid aspect of the basilar artery.  Nonvisualized anterior inferior cerebellar arteries.  Mild narrowing P1 segment left posterior cerebral artery and posterior cerebral artery distal branches bilaterally.  Bulges of the cavernous segment of the internal carotid artery bilaterally may reflect result of atherosclerotic type changes although difficult to completely exclude small aneurysms measuring up to 3.5 mm.  Feel type contribution to the posterior cerebral arteries bilaterally.  Mild narrowing middle cerebral artery branch vessels.  IMPRESSION: MRI HEAD  Acute small nonhemorrhagic left paracentral pontine infarct.  No intracranial hemorrhage.  Mild to moderate small vessel disease type changes.  Global atrophy.  MRA HEAD  Left vertebral artery is occluded.  Mild to moderate narrowing proximal to mid aspect of the basilar artery.  Nonvisualized anterior inferior cerebellar arteries.  Mild narrowing P1 segment left posterior cerebral artery and posterior cerebral artery distal branches bilaterally.  Bulges of the cavernous segment of the internal carotid artery bilaterally probably reflect result of atherosclerotic type changes although difficult to completely exclude small aneurysms measuring up to 3.5 mm.  Mild narrowing middle cerebral artery branch vessels.   Electronically Signed  By: Genia Del M.D.   On: 12/24/2014 20:18    Scheduled Meds: .  stroke: mapping our early stages of recovery book   Does not apply  Once  . aspirin  81 mg Oral Daily  . dorzolamide  1 drop Both Eyes BID  . febuxostat  40 mg Oral Daily  . finasteride  5 mg Oral Daily  . heparin  5,000 Units Subcutaneous 3 times per day  . latanoprost  1 drop Both Eyes QHS  . pneumococcal 23 valent vaccine  0.5 mL Intramuscular Tomorrow-1000  . timolol  1 drop Both Eyes BID  . cyanocobalamin  500 mcg Oral Daily   Continuous Infusions:   Time spent: 25 minutes  [facility] [email or web address], password [facility] 12/25/2014, 8:36 AM  LOS: 1 day

## 2014-12-25 NOTE — Evaluation (Signed)
Physical Therapy Evaluation Patient Details Name: Marcus [name] MRN: [id number] DOB: [birth year] Today's Date: 12/25/2014   History of Present Illness  Patient is a 81 y/o male admitted from [facility] due to complaints of pins and needles right side and tx to [facility] due to concern for CVA. MRI + for small left paracentral pontine infarct. MRA- Left vertebral artery is occluded. Mild to moderate narrowing proximal to mid aspect of the basilar artery.Mild narrowing middle cerebral artery branch vessels.   Clinical Impression  Patient presents with impaired sensation, proprioception and strength RUE/LLE impacting safe mobility. Balance improved with use of RW during mobility. Some right knee instability noted but no buckling. Pt concerned about driving as wife does not drive. Recommend use of RW, continued mobility while in hospital and HHPT to maximize independence and mobility so pt can return to PLOF. Will follow acutely as pt needs to perform stair training prior to d/c home.     Follow Up Recommendations Home health PT;Supervision/Assistance - 24 hour    Equipment Recommendations  None recommended by PT    Recommendations for Other Services       Precautions / Restrictions Precautions Precautions: Fall Restrictions Weight Bearing Restrictions: No      Mobility  Bed Mobility Overal bed mobility: Modified Independent Bed Mobility: Supine to Sit     Supine to sit: Modified independent (Device/Increase time)     General bed mobility comments: HOB flat, no uses to simulate home. No assist needed.  Transfers Overall transfer level: Needs assistance Equipment used: Rolling walker (2 wheeled);None Transfers: Sit to/from Stand Sit to Stand: Min guard         General transfer comment: Min guard for safety as pt mildly unsteady upon standing.   Ambulation/Gait Ambulation/Gait assistance: Min guard;Min assist Ambulation Distance (Feet): 100 Feet (x2 bouts) Assistive device:  Rolling walker (2 wheeled);None Gait Pattern/deviations: Step-through pattern;Decreased stride length;Narrow base of support;Trunk flexed;Staggering left;Staggering right   Gait velocity interpretation: <1.8 ft/sec, indicative of risk for recurrent falls General Gait Details: Ambulated without RW and with RW. Very guarded and unsteady without RW requiring Min A; more stable with use of RW requiring Min guard for safety. Right knee instability noted at times but no knee buckling. Difficulty withs taying in RW during turns.  Stairs            Wheelchair Mobility    Modified Rankin (Stroke Patients Only) Modified Rankin (Stroke Patients Only) Pre-Morbid Rankin Score: No symptoms Modified Rankin: Moderately severe disability     Balance Overall balance assessment: Needs assistance Sitting-balance support: Feet supported;No upper extremity supported Sitting balance-Leahy Scale: Good     Standing balance support: During functional activity Standing balance-Leahy Scale: Fair                               Pertinent Vitals/Pain Pain Assessment: No/denies pain    Home Living Family/patient expects to be discharged to:: Private residence Living Arrangements: Spouse/significant other;Children Available Help at Discharge: Family;Available 24 hours/day (Wife present 24/7 but son works.) Type of Home: Apartment (Lives in apt within son's home.) Home Access: Stairs to enter Entrance Stairs-Rails: Right Entrance Stairs-Number of Steps: 4 with HR + 3 without HR Home Layout: One level Home Equipment: Carney - 2 wheels;Cane - single point      Prior Function Level of Independence: Independent         Comments: Very active PTA. Does the driving because  his wife does not.      Hand Dominance   Dominant Hand: Right    Extremity/Trunk Assessment   Upper Extremity Assessment: Defer to OT evaluation (Reports numbness/tingling RUE. Weak grip compared to left side. )            Lower Extremity Assessment: RLE deficits/detail RLE Deficits / Details: Grossly ~4+/5 throughout with MMT however instability noted during functional mobility in quadriceps.       Communication   Communication: No difficulties  Cognition Arousal/Alertness: Awake/alert Behavior During Therapy: WFL for tasks assessed/performed Overall Cognitive Status: Within Functional Limits for tasks assessed                      General Comments General comments (skin integrity, edema, etc.): Wife present during evaluation.    Exercises        Assessment/Plan    PT Assessment Patient needs continued PT services  PT Diagnosis Difficulty walking;Generalized weakness   PT Problem List Decreased strength;Impaired sensation;Decreased activity tolerance;Decreased balance;Decreased mobility;Decreased knowledge of use of DME  PT Treatment Interventions Balance training;Gait training;Stair training;Functional mobility training;Therapeutic activities;Therapeutic exercise;Patient/family education;DME instruction   PT Goals (Current goals can be found in the Care Plan section) Acute Rehab PT Goals Patient Stated Goal: to be able to be independent again PT Goal Formulation: With patient/family Time For Goal Achievement: 01/08/15 Potential to Achieve Goals: Good    Frequency Min 4X/week   Barriers to discharge Inaccessible home environment Steps to navigate to enter home. Wife can only provide so much assist.     Co-evaluation               End of Session Equipment Utilized During Treatment: Gait belt Activity Tolerance: Patient tolerated treatment well Patient left: in chair;with call bell/phone within reach;with chair alarm set;with family/visitor present Nurse Communication: Mobility status         Time: 0912-0935 PT Time Calculation (min) (ACUTE ONLY): 23 min   Charges:   PT Evaluation $Initial PT Evaluation Tier I: 1 Procedure PT Treatments $Gait  Training: 8-22 mins   PT G Codes:        Marcus [name] A Marcus [name] 12/25/2014, 9:47 AM Marcus [name], PT, DPT [phone]

## 2014-12-25 NOTE — Evaluation (Signed)
Occupational Therapy Evaluation Patient Details Name: Marcus [name] MRN: [id number] DOB: [birth year] Today's Date: 12/25/2014    History of Present Illness Patient is a 81 y/o male admitted from [facility] due to complaints of pins and needles right side and tx to [facility] due to concern for CVA. MRI + for small left paracentral pontine infarct. MRA- Left vertebral artery is occluded. Mild to moderate narrowing proximal to mid aspect of the basilar artery.Mild narrowing middle cerebral artery branch vessels.    Clinical Impression   Pt was independent prior to admission. Reports impaired sensation on the back side of his head and in his R LE, but denies any difficulty with R UE.  Pt with impaired balance and feels as if his R knee will buckle.  Improved safety with use of RW. Will follow acutely to address ADL transfers and safety with standing ADL.      Follow Up Recommendations  No OT follow up    Equipment Recommendations   (determine need for shower seat vs 3 in 1 for showering)    Recommendations for Other Services       Precautions / Restrictions Precautions Precautions: Fall Restrictions Weight Bearing Restrictions: No      Mobility Bed Mobility Overal bed mobility: Modified Independent                Transfers   Equipment used: Rolling walker (2 wheeled) Transfers: Sit to/from Stand Sit to Stand: Min guard         General transfer comment: Min guard for safety as pt mildly unsteady upon standing.     Balance     Sitting balance-Leahy Scale: Good       Standing balance-Leahy Scale: Fair                              ADL Overall ADL's : Needs assistance/impaired Eating/Feeding: Independent;Sitting   Grooming: Min guard;Standing   Upper Body Bathing: Set up;Sitting   Lower Body Bathing: Min guard;Sit to/from stand   Upper Body Dressing : Set up;Sitting   Lower Body Dressing: Min guard;Sit to/from stand Lower Body Dressing Details  (indicate cue type and reason): no difficulty donning socks Toilet Transfer: Min guard;Ambulation;RW;Comfort height toilet   Toileting- Clothing Manipulation and Hygiene: Min guard;Sit to/from stand       Functional mobility during ADLs: Min guard;Rolling walker       Vision Additional Comments: macular degeneration in R eye, ptosis in L (longstanding)   Perception     Praxis      Pertinent Vitals/Pain Pain Assessment: No/denies pain     Hand Dominance Right   Extremity/Trunk Assessment Upper Extremity Assessment Upper Extremity Assessment: Overall WFL for tasks assessed   Lower Extremity Assessment Lower Extremity Assessment: Defer to PT evaluation       Communication Communication Communication: No difficulties   Cognition Arousal/Alertness: Awake/alert Behavior During Therapy: WFL for tasks assessed/performed Overall Cognitive Status: Within Functional Limits for tasks assessed                     General Comments       Exercises       Shoulder Instructions      Home Living Family/patient expects to be discharged to:: Private residence Living Arrangements: Spouse/significant other;Children Available Help at Discharge: Family;Available 24 hours/day Type of Home: Apartment (attached to son's home) Home Access: Stairs to enter [employer] of Steps: 4 with  HR + 3 without HR Entrance Stairs-Rails: Right Home Layout: One level     Bathroom Shower/Tub: Occupational psychologist: Standard     Home Equipment: Environmental consultant - 2 wheels;Cane - single point          Prior Functioning/Environment Level of Independence: Independent        Comments: Very active PTA. Does the driving because his wife does not.     OT Diagnosis: Hemiplegia dominant side   OT Problem List: Decreased strength;Decreased activity tolerance;Impaired balance (sitting and/or standing);Decreased knowledge of use of DME or AE   OT Treatment/Interventions:  Self-care/ADL training;DME and/or AE instruction;Patient/family education    OT Goals(Current goals can be found in the care plan section) Acute Rehab OT Goals Patient Stated Goal: to be able to be independent again OT Goal Formulation: With patient Time For Goal Achievement: 01/01/15 Potential to Achieve Goals: Good ADL Goals Pt Will Perform Grooming: with modified independence;standing Pt Will Transfer to Toilet: with modified independence;ambulating;regular height toilet Pt Will Perform Toileting - Clothing Manipulation and hygiene: with modified independence;sit to/from stand Pt Will Perform Tub/Shower Transfer: Shower transfer;with supervision;ambulating;rolling walker (determine need for shower seat) Additional ADL Goal #1: Pt will transport items safely using RW.  OT Frequency: Min 2X/week   Barriers to D/C:            Co-evaluation              End of Session Equipment Utilized During Treatment: Rolling walker;Gait belt  Activity Tolerance: Patient tolerated treatment well Patient left: in bed;with call bell/phone within reach;with bed alarm set;with family/visitor present   Time: 1540-1600 OT Time Calculation (min): 20 min Charges:  OT General Charges $OT Visit: 1 Procedure OT Evaluation $Initial OT Evaluation Tier I: 1 Procedure G-Codes:    Malka So 12/25/2014, 4:16 PM [phone]

## 2014-12-25 NOTE — Progress Notes (Signed)
*  PRELIMINARY RESULTS* Vascular Ultrasound Carotid Duplex (Doppler) has been completed.  Preliminary findings:  Right ICA 1-39% stenosis. Left ICA 40-59% stenosis based on systolic velocity and ratio, but based on diastolic velocity 7-15% stenosis.  Right vertebral antegrade flow. Left vertebral retrograde flow.  Known left subclavian stenosis.   Landry Mellow, RDMS, RVT  12/25/2014, 3:03 PM

## 2014-12-25 NOTE — Consult Note (Signed)
Referring Physician:  Dr Conley Canal    Chief Complaint: stroke  HPI: Marcus [name] is an 81 y.o. 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 Friday evening, 12/23/2014, the patient and his wife went to bed around 11 PM. The patient was drifting off to sleep when around 11:30 PM he developed a hot sensation in his left eye and the left side of his mouth. "like pepper" This was followed by a loud ringing in his ears. He then noticed that his right side went numb as if it had fallen asleep.   They went to [facility] where he was evaluated. TPA was not felt to be indicated as his deficits were fairly mild. He was transferred to [facility] for further evaluation.   The patient takes aspirin 81 mg daily and states that he never misses a dose. He recently saw his local physician for dyspnea on exertion. During that visit it was noted that his blood pressure was elevated. His medications were adjusted and a new medication was added for hypertension. The patient denies any chest pain.  He continues to have right-sided numbness which has not really improved. He has been ambulating with a walker since the incident as he does not feel steady on his feet; however, he denies weakness. Prior to this he walked independently for the most part with only occasional use of a cane.  Date last known well: Date: 12/23/2014 Time last known well: Time: 23:00 tPA Given: No:  Deficits were felt to be mild  Past Medical History  Diagnosis Date  . Hypertension   . Hyperlipemia   . History of stress test 09/02/2009    abnormal myocardial perfusion scan demonstrating an attenuation defect in the inferior region of the myocardium. No ischemia or infarct/scar is seen in the remaining myocardium. No prior study available for comparison. Abnormal myocardial  study EF 79%  . History of Doppler ultrasound     dopplers revealed mild left ICA stenosis which has remained stable, occluded left subclavian artery with retrograde left vertebral filling and moderately severe right subclavian artery stenosis. he has no symptoms of upper extermity claudication or subclavian steal symptoms.   . Subclavian artery stenosis     Past Surgical History  Procedure Laterality Date  . Cataract extraction      Family History  Problem Relation Age of Onset  . Heart disease Brother   . Cancer - Colon Sister   . Cancer Sister   . Other Brother     CABG   Social History:  reports that he quit smoking about 21 years ago. He has never used smokeless tobacco. He reports that he does not drink alcohol or use illicit drugs.  Allergies:  Allergies  Allergen Reactions  . Codeine Other (See Comments) and Anaphylaxis    Swollen tongue, numb around mouth, slurred speech  . Allopurinol Other (See Comments)    Hand pain  . Simvastatin Other (See Comments)    myalgias  . Uloric [Febuxostat] Other (See Comments)    Stomach pain    Medications:  Scheduled: .  stroke: mapping our early stages of recovery book   Does not apply Once  . aspirin  81 mg Oral Daily  . dorzolamide  1 drop Both Eyes BID  . febuxostat  40 mg Oral Daily  . finasteride  5 mg Oral Daily  .  heparin  5,000 Units Subcutaneous 3 times per day  . latanoprost  1 drop Both Eyes QHS  . timolol  1 drop Both Eyes BID  . cyanocobalamin  500 mcg Oral Daily    ROS: History obtained from the patient  General ROS: negative for - chills, fatigue, fever, night sweats, weight gain or weight loss Psychological ROS: negative for - behavioral disorder, hallucinations, memory difficulties, mood swings or suicidal ideation Ophthalmic ROS: Partially blind in his right eye - long-standing. Ptosis left  eyelid since a childhood accident. ENT ROS: negative for - epistaxis, nasal discharge, oral lesions, sore  throat, tinnitus or vertigo Allergy and Immunology ROS: negative for - hives or itchy/watery eyes Hematological and Lymphatic ROS: negative for - bleeding problems, bruising or swollen lymph nodes Endocrine ROS: negative for - galactorrhea, hair pattern changes, polydipsia/polyuria or temperature intolerance Respiratory ROS: negative for - cough, hemoptysis, shortness of breath or wheezing. Recent exertional dyspnea. Cardiovascular ROS: negative for - chest pain, dyspnea on exertion, edema or irregular heartbeat Gastrointestinal ROS: negative for - abdominal pain, diarrhea, hematemesis, nausea/vomiting or stool incontinence Genito-Urinary ROS: negative for - dysuria, hematuria, incontinence or urinary frequency/urgency Musculoskeletal ROS: negative for - joint swelling or muscular weakness. Positive for diffuse arthritis. Neurological ROS: as noted in HPI Dermatological ROS: negative for rash and skin lesion changes   Physical Examination: Blood pressure 146/64, pulse 55, temperature 97.9 F (36.6 C), temperature source Oral, resp. rate 18, height 6\' 2"  (1.88 m), weight 92.262 kg (203 lb 6.4 oz), SpO2 96 %.  General - pleasant well-developed well-nourished 81 year old male in no acute distress Heart - Regular rate and rhythm - no murmer appreciated Lungs - Clear to auscultation Abdomen - Soft - non tender Extremities - Distal pulses weak to absent - no edema Skin - Warm and dry  Mental Status: Alert, oriented, thought content appropriate.  Speech fluent without evidence of aphasia.  Able to follow 3 step commands without difficulty. Cranial Nerves: II: Discs not visualized; Visual fields grossly normal except partially blind in his right eye, pupils equal, round, reactive to light and accommodation III,IV, VI: Left ptosis since childhood, extra-ocular motions intact bilaterally V,VII: smile symmetric, sensation to light touch decreased on the right side of his face. VIII: hearing normal  bilaterally IX,X: gag reflex present XI: bilateral shoulder shrug XII: midline tongue extension Motor: Right : Upper extremity   5/5 right grip 4/5   Left:     Upper extremity   5/5  Lower extremity   5/5     Lower extremity   5/5 Tone and bulk:normal tone throughout; no atrophy noted Sensory: Sensation to light touch decreased in the right upper and lower extremities. Deep Tendon Reflexes: 2+ and symmetric throughout Plantars: Right: Equivocal   Left: Equivocal Cerebellar: normal finger-to-nose, normal rapid alternating movements and normal heel-to-shin test Gait: Deferred at this time    Laboratory Studies:  Basic Metabolic Panel:  Recent Labs Lab 12/25/14 0750  NA 137  K 5.2*  CL 109  CO2 20*  GLUCOSE 107*  BUN 29*  CREATININE 1.34*  CALCIUM 9.3    Liver Function Tests:  Recent Labs Lab 12/25/14 0750  AST 18  ALT 12*  ALKPHOS 82  BILITOT 0.5  PROT 6.9  ALBUMIN 3.3*   No results for input(s): LIPASE, AMYLASE in the last 168 hours. No results for input(s): AMMONIA in the last 168 hours.  CBC:  Recent Labs Lab 12/25/14 0750  WBC 4.3  HGB 13.5  HCT  39.3  MCV 98.5  PLT 155    Cardiac Enzymes: No results for input(s): CKTOTAL, CKMB, CKMBINDEX, TROPONINI in the last 168 hours.  BNP: Invalid input(s): POCBNP  CBG: No results for input(s): GLUCAP in the last 168 hours.  Microbiology: No results found for this or any previous visit.  Coagulation Studies: No results for input(s): LABPROT, INR in the last 72 hours.  Urinalysis: No results for input(s): COLORURINE, LABSPEC, PHURINE, GLUCOSEU, HGBUR, BILIRUBINUR, KETONESUR, PROTEINUR, UROBILINOGEN, NITRITE, LEUKOCYTESUR in the last 168 hours.  Invalid input(s): APPERANCEUR  Lipid Panel:    Component Value Date/Time   CHOL 209* 12/25/2014 0750   TRIG 215* 12/25/2014 0750   HDL 30* 12/25/2014 0750   CHOLHDL 7.0 12/25/2014 0750   VLDL 43* 12/25/2014 0750   LDLCALC 136* 12/25/2014 0750     HgbA1C: No results found for: HGBA1C  Urine Drug Screen:  No results found for: LABOPIA, COCAINSCRNUR, LABBENZ, AMPHETMU, THCU, LABBARB  Alcohol Level: No results for input(s): ETH in the last 168 hours.  Other results: EKG: Sinus rhythm rate 60 bpm. Please refer to the formal cardiology reading for complete details.  Imaging:  Mr Jodene Nam Head/brain Wo Cm 12/24/2014     MRI HEAD   Acute small nonhemorrhagic left paracentral pontine infarct.  No intracranial hemorrhage.  Mild to moderate small vessel disease type changes.  Global atrophy.    MRA HEAD   Left vertebral artery is occluded.   Mild to moderate narrowing proximal to mid aspect of the basilar artery.   Nonvisualized anterior inferior cerebellar arteries.   Mild narrowing P1 segment left posterior cerebral artery and posterior cerebral artery distal branches bilaterally.   Bulges of the cavernous segment of the internal carotid artery bilaterally probably reflect result of atherosclerotic type changes although difficult to completely exclude small aneurysms measuring up to 3.5 mm.   Mild narrowing middle cerebral artery branch vessels.       Assessment: 81 y.o. 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 Evaluated at an outside hospital; however, TPA not felt to be indicated secondary to mild deficits. The patient was transferred to [facility] for further evaluation. He takes aspirin 81 mg daily without fail.  Stroke Risk Factors - hyperlipidemia and hypertension (history of myalgias with Zocor in the past)  Plan: 1. HgbA1c, fasting lipid panel 2. PT consult, OT consult, Speech consult 3. Echocardiogram 4. Carotid dopplers 5. Prophylactic therapy-Antiplatelet med: Aspirin - dose 325 mg. 6. NPO  until RN stroke swallow screen 7. Telemetry monitoring 8. Frequent neuro checks  Further assessment and plan to follow per Dr. Leonel Ramsay.   Mikey Bussing PA-C [facility] Pager [phone] 12/25/2014, 2:15 PM   I have seen and evaluated the patient. I have reviewed the above note and made appropriate changes.   Stroke from likely small vessel disease. He will need modifcation of risk factors as above.   Roland Rack, MD [facility] [phone]  If 7pm- 7am, please page neurology on call as listed in [place].

## 2014-12-26 ENCOUNTER — Inpatient Hospital Stay (HOSPITAL_COMMUNITY): Payer: Medicare Other

## 2014-12-26 DIAGNOSIS — I6789 Other cerebrovascular disease: Secondary | ICD-10-CM

## 2014-12-26 LAB — BASIC METABOLIC PANEL
Anion gap: 8 (ref 5–15)
BUN: 30 mg/dL — AB (ref 6–20)
CHLORIDE: 108 mmol/L (ref 101–111)
CO2: 21 mmol/L — AB (ref 22–32)
Calcium: 9 mg/dL (ref 8.9–10.3)
Creatinine, Ser: 1.4 mg/dL — ABNORMAL HIGH (ref 0.61–1.24)
GFR calc Af Amer: 53 mL/min — ABNORMAL LOW (ref >60)
GFR calc non Af Amer: 46 mL/min — ABNORMAL LOW (ref >60)
Glucose, Bld: 148 mg/dL — ABNORMAL HIGH (ref 65–99)
Potassium: 4.5 mmol/L (ref 3.5–5.1)
Sodium: 137 mmol/L (ref 135–145)

## 2014-12-26 LAB — HEMOGLOBIN A1C
Hgb A1c MFr Bld: 6.3 % — ABNORMAL HIGH (ref 4.8–5.6)
Mean Plasma Glucose: 134 mg/dL

## 2014-12-26 MED ORDER — ATORVASTATIN CALCIUM 20 MG PO TABS: 20.0000 mg | ORAL_TABLET | Freq: Every day | ORAL | Status: DC

## 2014-12-26 MED ORDER — CLOPIDOGREL BISULFATE 75 MG PO TABS
75.0000 mg | ORAL_TABLET | Freq: Every day | ORAL | Status: DC
  Administered 2014-12-26: 75 mg via ORAL
  Filled 2014-12-26: qty 1

## 2014-12-26 MED ORDER — CLOPIDOGREL BISULFATE 75 MG PO TABS: 75.0000 mg | ORAL_TABLET | Freq: Every day | ORAL | Status: DC

## 2014-12-26 MED ORDER — ATORVASTATIN CALCIUM 20 MG PO TABS: 20.0000 mg | ORAL_TABLET | Freq: Every day | ORAL | Status: AC

## 2014-12-26 MED ORDER — ATORVASTATIN CALCIUM 10 MG PO TABS: 20.0000 mg | ORAL_TABLET | Freq: Every day | ORAL | Status: DC

## 2014-12-26 NOTE — Care Management Note (Signed)
Case Management Note  Patient Details  Name: Marcus [name] MRN: [id number] Date of Birth: Sep 08, 1931  Subjective/Objective:  81 y.o M admitted with Cerebral Thrombosis with cerebral Infarction. Ambulates with Walker. PT: HHPT;  OT: Tub shower seat.                   Action/Plan: Will continue to follow.    Expected Discharge Date:                  Expected Discharge Plan:     In-House Referral:     Discharge planning Services     Post Acute Care Choice:    Choice offered to:     DME Arranged:    DME Agency:     HH Arranged:    [place] Agency:     Status of Service:     Medicare Important Message Given:    Date Medicare IM Given:    Medicare IM give by:    Date Additional Medicare IM Given:    Additional Medicare Important Message give by:     If discussed at [place] of Stay Meetings, dates discussed:    Additional Comments:  Delrae Sawyers, RN 12/26/2014, 2:09 PM

## 2014-12-26 NOTE — Progress Notes (Signed)
Occupational Therapy Treatment Patient Details Name: Marcus [name] MRN: [id number] DOB: 1931/10/18 Today's Date: 12/26/2014    History of present illness Patient is a 81 y/o male admitted from [facility] due to complaints of pins and needles right side and tx to [facility] due to concern for CVA. MRI + for small left paracentral pontine infarct. MRA- Left vertebral artery is occluded. Mild to moderate narrowing proximal to mid aspect of the basilar artery.Mild narrowing middle cerebral artery branch vessels.    OT comments  Pt reports decreased sensation in R LE; denies R UE deficits. Pt reports feeling his R knee will buckle when ambulating. Pt demonstrated ability to complete shower transfer with supervision; education provided on benefits of using shower chair to prevent falls. Pt's wife reports they will get a shower chair.       Follow Up Recommendations  No OT follow up;Supervision - Intermittent    Equipment Recommendations  Tub/shower seat    Recommendations for Other Services      Precautions / Restrictions Precautions Precautions: Fall Restrictions Weight Bearing Restrictions: No       Mobility Bed Mobility               General bed mobility comments: Pt in chair upon arrival  Transfers Overall transfer level: Needs assistance Equipment used: Rolling walker (2 wheeled) Transfers: Sit to/from Stand Sit to Stand: Supervision              Balance Overall balance assessment: Needs assistance Sitting-balance support: Feet supported;No upper extremity supported Sitting balance-Leahy Scale: Good     Standing balance support: No upper extremity supported Standing balance-Leahy Scale: Fair                     ADL                                   Tub/ Shower Transfer: Walk-in shower;Supervision/safety;Ambulation;Rolling walker Tub/Shower Transfer Details (indicate cue type and reason): Education provided on use of shower chair to  prevent falls Functional mobility during ADLs: Min guard;Rolling walker        Vision                     Perception     Praxis      Cognition   Behavior During Therapy: WFL for tasks assessed/performed Overall Cognitive Status: Within Functional Limits for tasks assessed                       Extremity/Trunk Assessment               Exercises     Shoulder Instructions       General Comments      Pertinent Vitals/ Pain       Pain Assessment: No/denies pain  Home Living                                          Prior Functioning/Environment              Frequency Min 2X/week     Progress Toward Goals  OT Goals(current goals can now be found in the care plan section)  Progress towards OT goals: Progressing toward goals  Acute Rehab OT Goals Patient Stated Goal: to be  able to be independent again OT Goal Formulation: With patient Time For Goal Achievement: 01/01/15 Potential to Achieve Goals: Good ADL Goals Pt Will Perform Grooming: with modified independence;standing Pt Will Transfer to Toilet: with modified independence;ambulating;regular height toilet Pt Will Perform Toileting - Clothing Manipulation and hygiene: with modified independence;sit to/from stand Pt Will Perform Tub/Shower Transfer: Shower transfer;with supervision;ambulating;rolling walker Additional ADL Goal #1: Pt will transport items safely using RW.  Plan Discharge plan remains appropriate    Co-evaluation                 End of Session Equipment Utilized During Treatment: Gait belt;Rolling walker   Activity Tolerance Patient tolerated treatment well   Patient Left in chair;with call bell/phone within reach;with chair alarm set;with family/visitor present   Nurse Communication Mobility status;Precautions        Time: 4174-0814 OT Time Calculation (min): 10 min  Charges: OT General Charges $OT Visit: 1 Procedure OT  Treatments $Self Care/Home Management : 8-22 mins  Forest Gleason 12/26/2014, 10:49 AM

## 2014-12-26 NOTE — Progress Notes (Signed)
STROKE TEAM PROGRESS NOTE   HISTORY Marcus [name] is an 81 y.o. 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 Friday evening, 12/23/2014, the patient and his wife went to bed around 11 PM. The patient was drifting off to sleep when around 11:30 PM he developed a hot sensation in his left eye and the left side of his mouth. "like pepper".  This was followed by a loud ringing in his ears. He then noticed that his right side went numb as if it had fallen asleep.  They went to [facility] where he was evaluated. TPA was not felt to be indicated as his deficits were fairly mild. He was transferred to [facility] for further evaluation per his request. The patient takes aspirin 81 mg daily and states that he never misses a dose. He recently saw his local physician for dyspnea on exertion. During that visit it was noted that his blood pressure was elevated. His medications were adjusted and a new medication was added for hypertension. The patient denies any chest pain. He continues to have right-sided numbness which has not really improved. He has been ambulating with a walker since the incident as he does not feel steady on his feet; however, he denies weakness. Prior to this he walked independently for the most part with only occasional use of a cane.  Date last known well: Date: 12/23/2014 Time last known well: Time: 23:00 tPA Given: No: Deficits were felt to be mild   SUBJECTIVE (INTERVAL HISTORY) Multiple family members present. The patient feels that his numbness is somewhat improved.   OBJECTIVE Temp:  [97.8 F (36.6 C)-98.8 F (37.1 C)] 98.8 F (37.1 C) (07/25 0200) Pulse Rate:  [52-65] 60 (07/25 0200) Cardiac Rhythm:  [-] Sinus bradycardia (07/24 2000) Resp:  [16-169] 20 (07/25  0200) BP: (146-163)/(60-66) 146/62 mmHg (07/25 0200) SpO2:  [93 %-96 %] 94 % (07/25 0200)  No results for input(s): GLUCAP in the last 168 hours.  Recent Labs Lab 12/25/14 0750  NA 137  K 5.2*  CL 109  CO2 20*  GLUCOSE 107*  BUN 29*  CREATININE 1.34*  CALCIUM 9.3    Recent Labs Lab 12/25/14 0750  AST 18  ALT 12*  ALKPHOS 82  BILITOT 0.5  PROT 6.9  ALBUMIN 3.3*    Recent Labs Lab 12/25/14 0750  WBC 4.3  HGB 13.5  HCT 39.3  MCV 98.5  PLT 155   No results for input(s): CKTOTAL, CKMB, CKMBINDEX, TROPONINI in the last 168 hours. No results for input(s): LABPROT, INR in the last 72 hours. No results for input(s): COLORURINE, LABSPEC, [place], GLUCOSEU, HGBUR, BILIRUBINUR, KETONESUR, PROTEINUR, UROBILINOGEN, NITRITE, LEUKOCYTESUR in the last 72 hours.  Invalid input(s): APPERANCEUR     Component Value Date/Time   CHOL 209* 12/25/2014 0750   TRIG 215* 12/25/2014 0750   HDL 30* 12/25/2014 0750   CHOLHDL 7.0 12/25/2014 0750   VLDL 43* 12/25/2014 0750   LDLCALC 136* 12/25/2014 0750   No results found for: HGBA1C No results found for: LABOPIA, COCAINSCRNUR, LABBENZ, AMPHETMU, THCU, LABBARB  No results for input(s): ETH in the last 168 hours.   Imaging    Mr Brain Wo Contrast 12/24/2014     MRI HEAD   Acute small nonhemorrhagic left paracentral pontine infarct.  No intracranial hemorrhage.  Mild to moderate small vessel disease type changes.  Global atrophy.    MRA HEAD   Left vertebral artery is occluded.  Mild to moderate narrowing proximal to mid aspect of the basilar artery.  Nonvisualized anterior inferior cerebellar arteries.  Mild narrowing P1 segment left posterior cerebral artery and posterior cerebral artery distal branches bilaterally.   Bulges of the cavernous segment of the internal carotid artery bilaterally probably reflect result of atherosclerotic type changes although difficult to completely exclude small aneurysms measuring up to 3.5 mm.    Mild narrowing middle cerebral artery branch vessels.         PHYSICAL EXAM Pleasant elderly Caucasian male currently not in distress. . Afebrile. Head is nontraumatic. Neck is supple without bruit.    Cardiac exam no murmur or gallop. Lungs are clear to auscultation. Distal pulses are well felt. 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 Neurological Exam :  Awake  Alert oriented x 3. Normal speech and language.eye movements full without nystagmus.fundi were not visualized. Vision acuity and fields appear normal. Hearing is normal. Palatal movements are normal. Face symmetric. Tongue midline. Normal strength, tone, reflexes and coordination.Diminished right hemibody sensation. Gait deferred.  ASSESSMENT/PLAN Mr. Marcus [name] is a 81 y.o. male with history of hypertension, hyperlipidemia, subclavian artery stenosis / occlusion, partial blindness of the right eye, and remote tobacco history presenting with right-sided numbness. He did not receive IV t-PA due to mild deficits.  Stroke:  Dominant secondary to small vessel disease.  Resultant  right-sided numbness.  MRI  Acute small nonhemorrhagic left paracentral pontine infarct.  MRA  the left vertebral artery is occluded. Possible small aneurysms of both carotid arteries.  Carotid Doppler  Right ICA 1-39% stenosis. Left ICA 40-59% stenosis based on systolic velocity and ratio, but based on  diastolic velocity 7-51% stenosis. Right vertebral antegrade flow. Left vertebral retrograde flow. Known left subclavian  stenosis.  2D Echo pending  LDL 136  HgbA1c 6.3  Subcutaneous heparin for VTE prophylaxis  Diet Heart Room service appropriate?: Yes; Fluid consistency:: Thin  aspirin 81 mg orally every day prior to admission, now on clopidogrel 75 mg orally every day  Patient counseled to be compliant with his antithrombotic medications  Ongoing aggressive stroke risk factor management  Therapy  recommendations: No OT follow-up. PT eval pending.  Disposition:  Pending  Hypertension  Home meds: Amlodipine, Cozaar, and Hytrin  Stable  Patient counseled to be compliant with his blood pressure medications  Hyperlipidemia  Home meds:  The patient had been on Zocor in the past however this was discontinued secondary to myalgias.  LDL 136, goal < 70  Add Lipitor 20 mg daily - monitor for myalgias  Continue statin at discharge  Recommend PCSK9 inhibitor if patient does not tolerate Lipitor.    Other Stroke Risk Factors  Advanced age  Cigarette smoker, quit smoking more than 20 years ago.   Other Active Problems  Mild renal insufficiency with hyperkalemia  Mildly elevated glucose levels with no history diabetes    PLAN  Okay to discharge once workup and therapy evaluations completed.  Monitor for myalgias on Lipitor. Recommend PCSK9 inhibitor if patient does not tolerate Lipitor.  Follow-up with Dr. Leonie Man in 2 months   Hospital day # [place] [facility] Pager [phone] 12/26/2014, 8:42 AM I have personally examined this patient, reviewed notes, independently viewed imaging studies, participated in medical decision making and plan of care. I have made any additions or clarifications directly to  the above note. Agree with note above. He presented with right sided numbness due to left paramedian pontine infarct etiology likely small vessel disease. He remains at risk for neurological worsening, recurrent stroke, TIA and needs ongoing stroke evaluation and aggressive risk factor control. Recommend changing aspirin to Plavix for stroke prevention. Trial of a second statin and is intolerant may need to change to PCSK9 inhibitor. Discussed with patient and family and answered questions  Antony Contras, MD Medical Director [place] Pager: [phone] 12/26/2014 3:24 PM     To contact Stroke Continuity provider,  please refer to [email or web address] After hours, contact General Neurology

## 2014-12-26 NOTE — Progress Notes (Signed)
  Echocardiogram 2D Echocardiogram has been performed.  Darlina Sicilian M 12/26/2014, 10:59 AM

## 2014-12-26 NOTE — Discharge Summary (Signed)
Physician Discharge Summary  Marcus [name] WYO:[id number] DOB: 09/16/1931 DOA: 12/24/2014  PCP: Pcp Not In System  Admit date: 12/24/2014 Discharge date: 12/26/2014  Time spent: greater than 30 minuts  Recommendations for Outpatient Follow-up:  If myalgias on lipitor, d/c Monitor LFTs on statin If intolerant to lipitor, consider PCSK9 inhibitor  Discharge Diagnoses:  Principal Problem:   Acute ischemic stroke likely small vessel disease Active Problems:   Subclavian artery stenosis   Essential hypertension   Hyperlipidemia   Paresthesias of right arma and leg   Renal insufficiency   Glaucoma   BPH (benign prostatic hyperplasia)   Tophaceous gout   Discharge Condition: stable  Diet recommendation: heart healthy  Filed Weights   12/24/14 2000  Weight: 92.262 kg (203 lb 6.4 oz)    History of present illness:  81 y.o. male  With h/o HTN, HLD, BPH, tophaceous gout transferred from [facility] with right sided numbness and tingling. At about 11:30 pm last night, developed right sided parasthesias involving arm and leg. Also burning sensation left eye and mouth. Went to ED in [facility] last night at past midnight. Evaluated in ED, evaluated by teleneurologist, and felt to not be a TPA candidate due to mild sypmptoms. No weakness. No slurred speech or difficulty swallowing. CT brain negative. Creatinine 2.0. Baseline unknown. Requested transfer to [facility], as family in [place], and no MRI, echo, etc available at outside hospital until Monday.   Hospital Course:  Admitted to telemetry. Neurology consulted.  Stroke: Dominant secondary to small vessel disease.  Resultant right-sided numbness.  MRI Acute small nonhemorrhagic left paracentral pontine infarct.  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 Possible small aneurysms of both carotid arteries.  Carotid Doppler Right ICA 1-39% stenosis. Left ICA 40-59% stenosis based on systolic  velocity and ratio, but based on diastolic velocity 7-74% stenosis. Right vertebral antegrade flow. Left vertebral retrograde flow. Known left subclavian stenosis.  2D Echo normal EF, no source of embolus  LDL 136  HgbA1c 6.3  Has been intolerant to simvastatin. Willing to try lipitor.   Was on asa 81 mg PTA, neurology recommends switching to statin  Hypertension  May resume home medications  Hyperlipidemia  Home meds: The patient had been on Zocor in the past however this was discontinued secondary to myalgias.  LDL 136, goal < 70  Add Lipitor 20 mg daily - monitor for myalgias  Continue statin at discharge  Recommend PCSK9 inhibitor if patient does not tolerate Lipitor.  Renal insufficiency. Baseline creatinine unknown.  Procedures: none Consultations:  neurolog  Discharge Exam: Filed Vitals:   12/26/14 1339  BP: 134/55  Pulse: 54  Temp: 97.6 F (36.4 C)  Resp: 20    General: a and o Cardiovascular: rrr Respiratory: CTA Neuro: right sided numbness  Discharge Instructions   Discharge Instructions    Activity as tolerated - No restrictions    Complete by:  As directed      Ambulatory referral to Neurology    Complete by:  As directed   Dr. Leonie Man requests follow up for this patient in 2 months.     Diet - low sodium heart healthy    Complete by:  As directed      Discharge instructions    Complete by:  As directed   Stop aspirin          Current Discharge Medication List    START taking these medications   Details  atorvastatin (LIPITOR) 20 MG tablet  Take 1 tablet (20 mg total) by mouth daily at 6 PM. Qty: 30 tablet, Refills: 1    clopidogrel (PLAVIX) 75 MG tablet Take 1 tablet (75 mg total) by mouth daily. Qty: 30 tablet, Refills: 1      CONTINUE these medications which have NOT CHANGED   Details  amLODipine (NORVASC) 5 MG tablet Take 5 mg by mouth at bedtime.    cyanocobalamin 500 MCG tablet Take 500 mcg by mouth daily.     dorzolamide (TRUSOPT) 2 % ophthalmic solution Place 1 drop into both eyes 2 (two) times daily.    finasteride (PROSCAR) 5 MG tablet Take 5 mg by mouth daily. Refills: 10    latanoprost (XALATAN) 0.005 % ophthalmic solution Place 1 drop into both eyes at bedtime.    losartan (COZAAR) 100 MG tablet Take 100 mg by mouth daily. Refills: 5    magnesium gluconate (MAGONATE) 500 MG tablet Take 500 mg by mouth daily.    magnesium oxide (MAG-OX) 400 MG tablet Take 400 mg by mouth daily.    meloxicam (MOBIC) 7.5 MG tablet Take 7.5 mg by mouth daily as needed. For pain Refills: 5    terazosin (HYTRIN) 10 MG capsule Take 10 mg by mouth at bedtime.    timolol (TIMOPTIC) 0.5 % ophthalmic solution Place 1 drop into both eyes 2 (two) times daily. Refills: 3    ULORIC 40 MG tablet Take 40 mg by mouth daily. To prevent gout Refills: 5    Vitamin D, Ergocalciferol, (DRISDOL) 50000 UNITS CAPS Take 50,000 Units by mouth every 7 (seven) days.      STOP taking these medications     aspirin 81 MG tablet        Allergies  Allergen Reactions  . Codeine Other (See Comments) and Anaphylaxis    Swollen tongue, numb around mouth, slurred speech  . Allopurinol Other (See Comments)    Hand pain  . Simvastatin Other (See Comments)    myalgias  . Uloric [Febuxostat] Other (See Comments)    Stomach pain   Follow-up Information    Follow up with SETHI,PRAMOD, MD.   Specialties:  Neurology, Radiology   Why:  his office will call you   Contact information:   [address] [place] [address] [phone]        The results of significant diagnostics from this hospitalization (including imaging, microbiology, ancillary and laboratory) are listed below for reference.    Significant Diagnostic Studies: Mr [name] Contrast  12/24/2014   CLINICAL DATA:  81 year old hypertensive male with right-sided weakness. Initial encounter.  EXAM: MRI HEAD WITHOUT CONTRAST  MRA HEAD WITHOUT  CONTRAST  TECHNIQUE: Multiplanar, multiecho pulse sequences of the brain and surrounding structures were obtained without intravenous contrast. Angiographic images of the head were obtained using MRA technique without contrast.  COMPARISON:  None.  FINDINGS: MRI HEAD FINDINGS  Acute small nonhemorrhagic left paracentral pontine infarct.  No intracranial hemorrhage.  Mild to moderate small vessel disease type changes.  Global atrophy. Ventricular prominence felt to be related to atrophy rather hydrocephalus.  No intracranial mass lesion noted on this unenhanced exam.  Left vertebral artery not visualized.  Post lens replacement.  Elongated globes.  Sclerotic appearance right wrist C3-4 facet joint suggestive of degenerative changes.  MRA HEAD FINDINGS  Left vertebral artery is occluded.  Ectatic right vertebral artery.  Mild to moderate narrowing proximal to mid aspect of the basilar artery.  Nonvisualized anterior inferior cerebellar arteries.  Mild narrowing  P1 segment left posterior cerebral artery and posterior cerebral artery distal branches bilaterally.  Bulges of the cavernous segment of the internal carotid artery bilaterally may reflect result of atherosclerotic type changes although difficult to completely exclude small aneurysms measuring up to 3.5 mm.  Feel type contribution to the posterior cerebral arteries bilaterally.  Mild narrowing middle cerebral artery branch vessels.  IMPRESSION: MRI HEAD  Acute small nonhemorrhagic left paracentral pontine infarct.  No intracranial hemorrhage.  Mild to moderate small vessel disease type changes.  Global atrophy.  MRA HEAD  Left vertebral artery is occluded.  Mild to moderate narrowing proximal to mid aspect of the basilar artery.  Nonvisualized anterior inferior cerebellar arteries.  Mild narrowing P1 segment left posterior cerebral artery and posterior cerebral artery distal branches bilaterally.  Bulges of the cavernous segment of the internal carotid artery  bilaterally probably reflect result of atherosclerotic type changes although difficult to completely exclude small aneurysms measuring up to 3.5 mm.  Mild narrowing middle cerebral artery branch vessels.   Electronically Signed   By: Genia Del M.D.   On: 12/24/2014 20:18   Mr Jodene Nam Head/brain Wo Cm  12/24/2014   CLINICAL DATA:  81 year old hypertensive male with right-sided weakness. Initial encounter.  EXAM: MRI HEAD WITHOUT CONTRAST  MRA HEAD WITHOUT CONTRAST  TECHNIQUE: Multiplanar, multiecho pulse sequences of the brain and surrounding structures were obtained without intravenous contrast. Angiographic images of the head were obtained using MRA technique without contrast.  COMPARISON:  None.  FINDINGS: MRI HEAD FINDINGS  Acute small nonhemorrhagic left paracentral pontine infarct.  No intracranial hemorrhage.  Mild to moderate small vessel disease type changes.  Global atrophy. Ventricular prominence felt to be related to atrophy rather hydrocephalus.  No intracranial mass lesion noted on this unenhanced exam.  Left vertebral artery not visualized.  Post lens replacement.  Elongated globes.  Sclerotic appearance right wrist C3-4 facet joint suggestive of degenerative changes.  MRA HEAD FINDINGS  Left vertebral artery is occluded.  Ectatic right vertebral artery.  Mild to moderate narrowing proximal to mid aspect of the basilar artery.  Nonvisualized anterior inferior cerebellar arteries.  Mild narrowing P1 segment left posterior cerebral artery and posterior cerebral artery distal branches bilaterally.  Bulges of the cavernous segment of the internal carotid artery bilaterally may reflect result of atherosclerotic type changes although difficult to completely exclude small aneurysms measuring up to 3.5 mm.  Feel type contribution to the posterior cerebral arteries bilaterally.  Mild narrowing middle cerebral artery branch vessels.  IMPRESSION: MRI HEAD  Acute small nonhemorrhagic left paracentral pontine  infarct.  No intracranial hemorrhage.  Mild to moderate small vessel disease type changes.  Global atrophy.  MRA HEAD  Left vertebral artery is occluded.  Mild to moderate narrowing proximal to mid aspect of the basilar artery.  Nonvisualized anterior inferior cerebellar arteries.  Mild narrowing P1 segment left posterior cerebral artery and posterior cerebral artery distal branches bilaterally.  Bulges of the cavernous segment of the internal carotid artery bilaterally probably reflect result of atherosclerotic type changes although difficult to completely exclude small aneurysms measuring up to 3.5 mm.  Mild narrowing middle cerebral artery branch vessels.   Electronically Signed   By: Genia Del M.D.   On: 12/24/2014 20:18   Echo Left ventricle: The cavity size was normal. Wall thickness was increased in a pattern of mild LVH. Systolic function was normal. The estimated ejection fraction was in the range of 55% to 60%. Wall motion was normal; there were no  regional wall motion abnormalities. Left ventricular diastolic function parameters were normal. - Aortic valve: There was mild regurgitation. - Mitral valve: There was mild regurgitation. - Atrial septum: No defect or patent foramen ovale was identified.  Microbiology: No results found for this or any previous visit (from the past 240 hour(s)).   Labs: Basic Metabolic Panel:  Recent Labs Lab 12/25/14 0750 12/26/14 0831  NA 137 137  K 5.2* 4.5  CL 109 108  CO2 20* 21*  GLUCOSE 107* 148*  BUN 29* 30*  CREATININE 1.34* 1.40*  CALCIUM 9.3 9.0   Liver Function Tests:  Recent Labs Lab 12/25/14 0750  AST 18  ALT 12*  ALKPHOS 82  BILITOT 0.5  PROT 6.9  ALBUMIN 3.3*   No results for input(s): LIPASE, AMYLASE in the last 168 hours. No results for input(s): AMMONIA in the last 168 hours. CBC:  Recent Labs Lab 12/25/14 0750  WBC 4.3  HGB 13.5  HCT 39.3  MCV 98.5  PLT 155   Cardiac Enzymes: No results  for input(s): CKTOTAL, CKMB, CKMBINDEX, TROPONINI in the last 168 hours. BNP: BNP (last 3 results) No results for input(s): BNP in the last 8760 hours.  ProBNP (last 3 results) No results for input(s): PROBNP in the last 8760 hours.  CBG: No results for input(s): GLUCAP in the last 168 hours.     SignedDelfina Redwood  [facility] 12/26/2014, 2:34 PM

## 2014-12-26 NOTE — Progress Notes (Signed)
Discharge instructions reviewed with patient/family. All questions answered at this time. RXs given. Transport by family.   Ave Filter, RN

## 2014-12-26 NOTE — Progress Notes (Signed)
Physical Therapy Treatment Patient Details Name: Marcus [name] MRN: [id number] DOB: 06/21/1931 Today's Date: 12/26/2014    History of Present Illness Patient is a 81 y/o male admitted from [facility] due to complaints of pins and needles right side and tx to [facility] due to concern for CVA. MRI + for small left paracentral pontine infarct. MRA- Left vertebral artery is occluded. Mild to moderate narrowing proximal to mid aspect of the basilar artery.Mild narrowing middle cerebral artery branch vessels.     PT Comments    Pt is progressing towards goals. Pt demo improved ambulation tolerance today and was able to ascend and descend stairs safely. Challenged pt to ambulate using no AD and pt became mildly unstable with noticeable L lateral drift. Continue to recommend HHPT for DC to help progress pt to using a SPC and to improve overall functional mobility.    Follow Up Recommendations  Home health PT;Supervision/Assistance - 24 hour     Equipment Recommendations  Cane;Rolling walker with 5" wheels    Recommendations for Other Services       Precautions / Restrictions Precautions Precautions: Fall Restrictions Weight Bearing Restrictions: No    Mobility  Bed Mobility Overal bed mobility: Modified Independent Bed Mobility: Supine to Sit     Supine to sit: Modified independent (Device/Increase time)     General bed mobility comments: Pt sat to EOB upon therapist entering room with no prior instruction to due so. Pt used bed railing to help sit up.   Transfers Overall transfer level: Needs assistance Equipment used: Rolling walker (2 wheeled);None (second ambulation bout with no AD for sit to stand ) Transfers: Sit to/from Stand Sit to Stand: Supervision         General transfer comment: supervision A for pt safety, VC for pushing up from bed.  Ambulation/Gait Ambulation/Gait assistance: Supervision;Min guard Ambulation Distance (Feet): 280 Feet Assistive device: Rolling  walker (2 wheeled);Straight cane;None Gait Pattern/deviations: Step-through pattern;Drifts right/left Gait velocity: slow Gait velocity interpretation: Below normal speed for age/gender General Gait Details: First 100 ft pt ambulated with RW with supervision A and min VC to stay in close proxmity to walker. At 200 ft therapist challenged pt to ambulate without and AD. Pt was mildly unsteady with some left side drift. for 80 ft pt then demo amublation with SPC. Pt gt did improve however pt still mildly unsteady.   Stairs Stairs: Yes Stairs assistance: Supervision Stair Management: Two rails;Forwards Number of Stairs: 3 (x2) General stair comments: pt required VC for stepping up with the stronger leg and stepping down with the weaker leg  Wheelchair Mobility    Modified Rankin (Stroke Patients Only) Modified Rankin (Stroke Patients Only) Pre-Morbid Rankin Score: No symptoms Modified Rankin: Slight disability     Balance Overall balance assessment: Needs assistance Sitting-balance support: Feet supported;No upper extremity supported Sitting balance-Leahy Scale: Good     Standing balance support: Bilateral upper extremity supported;During functional activity Standing balance-Leahy Scale: Fair Standing balance comment: static standing balance pt is able to do with no assistance however dynamic balance pt is unsteady.                    Cognition Arousal/Alertness: Awake/alert Behavior During Therapy: WFL for tasks assessed/performed Overall Cognitive Status: Within Functional Limits for tasks assessed                      Exercises      General Comments General comments (skin integrity, edema,  etc.): left lateral side drift when ambulating with no AD      Pertinent Vitals/Pain Pain Assessment: No/denies pain    Home Living                      Prior Function            PT Goals (current goals can now be found in the care plan section) Acute  Rehab PT Goals Patient Stated Goal: wants to go home Progress towards PT goals: Progressing toward goals    Frequency  Min 4X/week    PT Plan Current plan remains appropriate    Co-evaluation             End of Session   Activity Tolerance: Patient tolerated treatment well Patient left: in bed;with call bell/phone within reach;with family/visitor present     Time: 1346-1405 PT Time Calculation (min) (ACUTE ONLY): 19 min  Charges:                       G Codes:      Marcus [name] 12/31/14, 3:06 PM   Marcus [name] (student physical therapy assistant) Acute Rehab [phone]

## 2015-02-27 ENCOUNTER — Ambulatory Visit: Payer: Self-pay | Admitting: Neurology

## 2015-02-27 ENCOUNTER — Telehealth: Payer: Self-pay | Admitting: Neurology

## 2015-02-27 NOTE — Telephone Encounter (Signed)
Called pt back to let him know I can see report and images from his MRI on 12/24/14. He can bring CD's if he has them in time, but to keep appointment for 10.3 because Dr. Jaynee Eagles can see the images through our system. He verbalized understanding.

## 2015-02-27 NOTE — Telephone Encounter (Signed)
Patient is calling because he was asked to bring an MRI film to his appointment on 03/06/15.  He states that he got a recording stating it would be from 7-10 days after receipt of request.  I asked him to call them again and leave his name, request, etc and ask them to call him back. His question is if they can't get it by his appointment date should he keep his appointment?  Please call.

## 2015-03-06 ENCOUNTER — Ambulatory Visit: Payer: Self-pay | Admitting: Neurology

## 2015-03-20 ENCOUNTER — Telehealth: Payer: Self-pay | Admitting: *Deleted

## 2015-03-20 NOTE — Telephone Encounter (Signed)
Called pt to switch appt to Dr. Leonie Man since he was the one who saw him in the hospital. Pt okay with this and switched appt to 04/13/15 at 330pm for check in at 300pm. He verbalized understanding.

## 2015-04-10 ENCOUNTER — Ambulatory Visit: Payer: Medicare Other | Admitting: Neurology

## 2015-04-13 ENCOUNTER — Ambulatory Visit: Payer: Self-pay | Admitting: Neurology

## 2015-04-26 ENCOUNTER — Encounter: Payer: Self-pay | Admitting: Neurology

## 2015-04-26 ENCOUNTER — Ambulatory Visit (INDEPENDENT_AMBULATORY_CARE_PROVIDER_SITE_OTHER): Payer: Medicare Other | Admitting: Neurology

## 2015-04-26 VITALS — BP 156/64 | HR 51 | Ht 74.0 in | Wt 216.4 lb

## 2015-04-26 DIAGNOSIS — I639 Cerebral infarction, unspecified: Secondary | ICD-10-CM

## 2015-04-26 DIAGNOSIS — I6381 Other cerebral infarction due to occlusion or stenosis of small artery: Secondary | ICD-10-CM | POA: Insufficient documentation

## 2015-04-26 MED ORDER — GABAPENTIN 100 MG PO CAPS
100.0000 mg | ORAL_CAPSULE | Freq: Three times a day (TID) | ORAL | Status: DC
Start: 2015-04-26 — End: 2015-11-15

## 2015-04-26 NOTE — Patient Instructions (Signed)
I had a long d/w patient about his recent  Lacunar left Pontine stroke, risk for recurrent stroke/TIAs, personally independently reviewed imaging studies and stroke evaluation results and answered questions.Continue Plavix  for secondary stroke prevention and maintain strict control of hypertension with blood pressure goal below 130/90, diabetes with hemoglobin A1c goal below 6.5% and lipids with LDL cholesterol goal below 100 mg/dL. I also advised the patient to eat a healthy diet with plenty of whole grains, cereals, fruits and vegetables, exercise regularly and maintain ideal body weight .trial of gabapentin for his post stroke paresthesias which seem to be bothersome. Start 100 mg 3 times daily and may increase later if tolerated and needed. I discussed possible side effects with him and advised him to call me if necessary. I also advised him to see his primary physician for more aggressive blood pressure control. Followup in the future with me in 6 months or call earlier if necessary Thank you for coming to see [place] at [facility]. I hope we have been able to provide you high quality care today. You may receive a patient satisfaction survey over the next few weeks. We would appreciate your feedback and comments so that we may continue to improve ourselves and the health of our patients. Stroke Prevention Some medical conditions and behaviors are associated with an increased chance of having a stroke. You may prevent a stroke by making healthy choices and managing medical conditions. HOW CAN I REDUCE MY RISK OF HAVING A STROKE?   Stay physically active. Get at least 30 minutes of activity on most or all days.  Do not smoke. It may also be helpful to avoid exposure to secondhand smoke.  Limit alcohol use. Moderate alcohol use is considered to be:  No more than 2 drinks per day for men.  No more than 1 drink per day for nonpregnant women.  Eat healthy foods. This  involves:  Eating 5 or more servings of fruits and vegetables a day.  Making dietary changes that address high blood pressure (hypertension), high cholesterol, diabetes, or obesity.  Manage your cholesterol levels.  Making food choices that are high in fiber and low in saturated fat, trans fat, and cholesterol may control cholesterol levels.  Take any prescribed medicines to control cholesterol as directed by your health care provider.  Manage your diabetes.  Controlling your carbohydrate and sugar intake is recommended to manage diabetes.  Take any prescribed medicines to control diabetes as directed by your health care provider.  Control your hypertension.  Making food choices that are low in salt (sodium), saturated fat, trans fat, and cholesterol is recommended to manage hypertension.  Ask your health care provider if you need treatment to lower your blood pressure. Take any prescribed medicines to control hypertension as directed by your health care provider.  If you are 62-21 years of age, have your blood pressure checked every 3-5 years. If you are 39 years of age or older, have your blood pressure checked every year.  Maintain a healthy weight.  Reducing calorie intake and making food choices that are low in sodium, saturated fat, trans fat, and cholesterol are recommended to manage weight.  Stop drug abuse.  Avoid taking birth control pills.  Talk to your health care provider about the risks of taking birth control pills if you are over 28 years old, smoke, get migraines, or have ever had a blood clot.  Get evaluated for sleep disorders (sleep apnea).  Talk to your health care  provider about getting a sleep evaluation if you snore a lot or have excessive sleepiness.  Take medicines only as directed by your health care provider.  For some people, aspirin or blood thinners (anticoagulants) are helpful in reducing the risk of forming abnormal blood clots that can lead  to stroke. If you have the irregular heart rhythm of atrial fibrillation, you should be on a blood thinner unless there is a good reason you cannot take them.  Understand all your medicine instructions.  Make sure that other conditions (such as anemia or atherosclerosis) are addressed. SEEK IMMEDIATE MEDICAL CARE IF:   You have sudden weakness or numbness of the face, arm, or leg, especially on one side of the body.  Your face or eyelid droops to one side.  You have sudden confusion.  You have trouble speaking (aphasia) or understanding.  You have sudden trouble seeing in one or both eyes.  You have sudden trouble walking.  You have dizziness.  You have a loss of balance or coordination.  You have a sudden, severe headache with no known cause.  You have new chest pain or an irregular heartbeat. Any of these symptoms may represent a serious problem that is an emergency. Do not wait to see if the symptoms will go away. Get medical help at once. Call your local emergency services (911 in [place] Do not drive yourself to the hospital.   This information is not intended to replace advice given to you by your health care provider. Make sure you discuss any questions you have with your health care provider.   Document Released: 06/27/2004 Document Revised: 06/10/2014 Document Reviewed: 11/20/2012 Elsevier Interactive Patient Education [employer].

## 2015-04-26 NOTE — Progress Notes (Signed)
[facility] [address] [place]. [place] [address] [phone]       OFFICE FOLLOW-UP NOTE  Mr. Marcus [name] Date of Birth:  1931/11/12 Medical Record Number:  [id number]   HPI: 81 year old Caucasian male seen today for first office follow-up visit following recent hospital admission in July 2016 for stroke. Marcus [name] is an 81 y.o. 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 Friday evening, 12/23/2014, the patient and his wife went to bed around 11 PM. The patient was drifting off to sleep when around 11:30 PM he developed a hot sensation in his left eye and the left side of his mouth. "like pepper". This was followed by a loud ringing in his ears. He then noticed that his right side went numb as if it had fallen asleep. They went to [facility] in [place], [place] where he was evaluated. TPA was not felt to be indicated as his deficits were fairly mild. He was transferred to [facility] for further evaluation per his request. The patient takes aspirin 81 mg daily and states that he never misses a dose. He recently saw his local physician for dyspnea on exertion. During that visit it was noted that his blood pressure was elevated. His medications were adjusted and a new medication was added for hypertension. He continued to have right-sided numbness which has not really improved. He has been ambulating with a walker since the incident as he does not feel steady on his feet; however, he denies weakness. Prior to this he walked independently for the most part with only occasional use of a cane. Date last known well: Date: 12/23/2014 Time last known well: Time: 23:00 tPA Given: No: Deficits were felt to be mild.  MRI scan of the brain showed an acute non-hemorrhagic left paracentral pontine  infarct with mild changes of small vessel disease and generalized cerebral atrophy. MRA showed occluded left vertebral artery. The tiny bulges noted in the cavernous segments of the internal carotid artery bilaterally probably due to atherosclerotic changes versus aneurysms. Carotid Doppler showed no significant extracranial stenosis and left vertebral artery had retrograde flow and patient had known left subclavian stenosis. LDL cholesterol was elevated at 136 and hemoglobin A1c was 6.3. Transthoracic echo showed ejection fraction of 55-60% without significant wall motion abnormalities. He was started on Plavix for stroke prevention and Lipitor for elevated lipids. Patient states his done well since discharge. He however continues to have right-sided numbness which is bothersome at times and he still has some trouble walking. He is not happy with his blood pressure control which still remains high and today it is 156/69. He is tolerating Plavix well without bleeding or bruising.   ROS:   14 system review of systems is positive for ringing in the ears, shortness of breath, snoring, feeling cold, numbness, decreased energy and all other systems negative  PMH:  Past Medical History  Diagnosis Date  . Hypertension   . Hyperlipemia   . History of stress test 09/02/2009    abnormal myocardial perfusion scan demonstrating an attenuation defect in the inferior region of the myocardium. No ischemia or infarct/scar is seen in the remaining myocardium. No prior study available for comparison. Abnormal myocardial study EF 79%  . History of Doppler ultrasound     dopplers revealed mild left ICA stenosis which has remained stable, occluded left  subclavian artery with retrograde left vertebral filling and moderately severe right subclavian artery stenosis. he has no symptoms of upper extermity claudication or subclavian steal symptoms.   . Subclavian artery stenosis ([place])   . Stroke [facility])     Social History:    Social History   Social History  . Marital Status: Married    Spouse Name: N/A  . Number of Children: N/A  . Years of Education: N/A   Occupational History  . Not on file.   Social History Main Topics  . Smoking status: Former Smoker    Quit date: 06/03/1993  . Smokeless tobacco: Never Used  . Alcohol Use: No  . Drug Use: No  . Sexual Activity: Not on file   Other Topics Concern  . Not on file   Social History Narrative    Medications:   Current Outpatient Prescriptions on File Prior to Visit  Medication Sig Dispense Refill  . amLODipine (NORVASC) 5 MG tablet Take 5 mg by mouth at bedtime.    Marland Kitchen atorvastatin (LIPITOR) 20 MG tablet Take 1 tablet (20 mg total) by mouth daily at 6 PM. 30 tablet 1  . clopidogrel (PLAVIX) 75 MG tablet Take 1 tablet (75 mg total) by mouth daily. 30 tablet 1  . cyanocobalamin 500 MCG tablet Take 500 mcg by mouth daily.    . dorzolamide (TRUSOPT) 2 % ophthalmic solution Place 1 drop into both eyes 2 (two) times daily.    . finasteride (PROSCAR) 5 MG tablet Take 5 mg by mouth daily.  10  . latanoprost (XALATAN) 0.005 % ophthalmic solution Place 1 drop into both eyes at bedtime.    Marland Kitchen losartan (COZAAR) 100 MG tablet Take 100 mg by mouth daily.  5  . magnesium oxide (MAG-OX) 400 MG tablet Take 400 mg by mouth daily.    . meloxicam (MOBIC) 7.5 MG tablet Take 7.5 mg by mouth daily as needed. For pain  5  . terazosin (HYTRIN) 10 MG capsule Take 10 mg by mouth at bedtime.    . timolol (TIMOPTIC) 0.5 % ophthalmic solution Place 1 drop into both eyes 2 (two) times daily.  3  . ULORIC 40 MG tablet Take 40 mg by mouth daily. To prevent gout  5  . Vitamin D, Ergocalciferol, (DRISDOL) 50000 UNITS CAPS Take 50,000 Units by mouth every 7 (seven) days.     No current facility-administered medications on file prior to visit.    Allergies:   Allergies  Allergen Reactions  . Codeine Other (See Comments) and Anaphylaxis    Swollen tongue, numb around mouth,  slurred speech  . Allopurinol Other (See Comments)    Hand pain  . Simvastatin Other (See Comments)    myalgias  . Uloric [Febuxostat] Other (See Comments)    Stomach pain    Physical Exam General: well developed, well nourished elderly Caucasian male, seated, in no evident distress Head: head normocephalic and atraumatic.  Neck: supple with no carotid but right greater than left supraclavicular bruits Cardiovascular: regular rate and rhythm, no murmurs Musculoskeletal: no deformity Skin:  no rash/petichiae Vascular:  Normal pulses all extremities Filed Vitals:   04/26/15 1350  BP: 156/64  Pulse: 51   Neurologic Exam Mental Status: Awake and fully alert. Oriented to place and time. Recent and remote memory intact. Attention span, concentration and fund of knowledge appropriate. Mood and affect appropriate.  Cranial Nerves: Fundoscopic exam reveals sharp disc margins. Pupils equal, briskly reactive to light. Extraocular movements full without nystagmus.  Mild mechanical ptosis left eye Visual fields full to confrontation. Hearing intact. Facial sensation intact. Face, tongue, palate moves normally and symmetrically.  Motor: Normal bulk and tone. Normal strength in all tested extremity muscles. Sensory.: intact to touch ,pinprick .position and vibratory sensation. Mild subjective paresthesias in the right arm but no objective sensory loss Coordination: Rapid alternating movements normal in all extremities. Finger-to-nose and heel-to-shin performed accurately bilaterally. Gait and Station: Arises from chair without difficulty. Stance is normal. Gait demonstrates normal stride length and balance . Unable to heel, toe and tandem walk without difficulty.  Reflexes: 1+ and symmetric. Toes downgoing.   NIHSS  0 Modified Rankin  1   ASSESSMENT: 72 year Caucasian male with left pontine infarct secondary to small vessel disease with vascular risk factors of hypertension hyperlipidemia ,   Subclavian  vascular disease,and with now post stroke paresthesias.    PLAN: I had a long d/w patient about his recent  [place] left Pontine stroke, risk for recurrent stroke/TIAs, personally independently reviewed imaging studies and stroke evaluation results and answered questions.Continue Plavix  for secondary stroke prevention and maintain strict control of hypertension with blood pressure goal below 130/90, diabetes with hemoglobin A1c goal below 6.5% and lipids with LDL cholesterol goal below 100 mg/dL. I also advised the patient to eat a healthy diet with plenty of whole grains, cereals, fruits and vegetables, exercise regularly and maintain ideal body weight .trial of gabapentin for his post stroke paresthesias which seem to be bothersome. Start 100 mg 3 times daily and may increase later if tolerated and needed. I discussed possible side effects with him and advised him to call me if necessary. I also advised him to see his primary physician for more aggressive blood pressure control.Greater than 50% of time during this 25 minute visit was spent on counseling,explanation of diagnosis, planning of further management, discussion with patient and family and coordination of care  Followup in the future with me in 6 months or call earlier if necessary Antony Contras, MD Note: This document was prepared with digital dictation and possible smart phrase technology. Any transcriptional errors that result from this process are unintentional

## 2015-06-26 ENCOUNTER — Ambulatory Visit (HOSPITAL_COMMUNITY)
Admission: RE | Admit: 2015-06-26 | Discharge: 2015-06-26 | Disposition: A | Discharge status: Home / Self Care | Payer: Medicare Other | Source: Ambulatory Visit | Attending: Cardiovascular Disease | Admitting: Cardiovascular Disease

## 2015-06-26 ENCOUNTER — Other Ambulatory Visit (HOSPITAL_COMMUNITY): Payer: Self-pay | Admitting: Physician Assistant

## 2015-06-26 DIAGNOSIS — Z8673 Personal history of transient ischemic attack (TIA), and cerebral infarction without residual deficits: Secondary | ICD-10-CM | POA: Diagnosis not present

## 2015-06-26 DIAGNOSIS — M7989 Other specified soft tissue disorders: Secondary | ICD-10-CM

## 2015-06-26 DIAGNOSIS — I10 Essential (primary) hypertension: Secondary | ICD-10-CM | POA: Diagnosis not present

## 2015-06-26 DIAGNOSIS — E785 Hyperlipidemia, unspecified: Secondary | ICD-10-CM | POA: Diagnosis not present

## 2015-06-26 DIAGNOSIS — M79605 Pain in left leg: Secondary | ICD-10-CM

## 2015-10-24 ENCOUNTER — Ambulatory Visit: Payer: Medicare Other | Admitting: Neurology

## 2015-11-07 ENCOUNTER — Ambulatory Visit (INDEPENDENT_AMBULATORY_CARE_PROVIDER_SITE_OTHER): Payer: Medicare Other | Admitting: Neurology

## 2015-11-07 ENCOUNTER — Encounter: Payer: Self-pay | Admitting: Neurology

## 2015-11-07 VITALS — BP 133/57 | HR 54 | Ht 74.0 in | Wt 209.6 lb

## 2015-11-07 DIAGNOSIS — R202 Paresthesia of skin: Secondary | ICD-10-CM

## 2015-11-07 DIAGNOSIS — R0989 Other specified symptoms and signs involving the circulatory and respiratory systems: Secondary | ICD-10-CM | POA: Diagnosis not present

## 2015-11-07 MED ORDER — TOPIRAMATE 25 MG PO TABS: 25.0000 mg | ORAL_TABLET | Freq: Two times a day (BID) | ORAL | Status: DC

## 2015-11-07 NOTE — Patient Instructions (Signed)
I had a long d/w patient about his remote stroke, risk for recurrent stroke/TIAs, personally independently reviewed imaging studies and stroke evaluation results and answered questions.Continue Plavix 75 mg daily for secondary stroke prevention and maintain strict control of hypertension with blood pressure goal below 130/90, diabetes with hemoglobin A1c goal below 6.5% and lipids with LDL cholesterol goal below 70 mg/dL. I also advised the patient to eat a healthy diet with plenty of whole grains, cereals, fruits and vegetables, exercise regularly and maintain ideal body weight. I also prescribed Topamax 25 mg twice daily to help with his post stroke paresthesias. I have discussed possible side effects with the patient and advised him to call me if needed. Check carotid ultrasound to follow his carotid bruits. Follow-up in the future with nurse practitioner in 3 months or call earlier if necessary.

## 2015-11-07 NOTE — Progress Notes (Signed)
[facility] [address] [place]. [place] [address] [phone]       OFFICE FOLLOW-UP NOTE  Mr. Marcus [name] Date of Birth:  [birth year] Medical Record Number:  [id number]   HPI: 82 year old Caucasian male seen today for first office follow-up visit following recent hospital admission in July 2016 for stroke. [address] is an 82 y.o. 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 Friday evening, 12/23/2014, the patient and his wife went to bed around 11 PM. The patient was drifting off to sleep when around 11:30 PM he developed a hot sensation in his left eye and the left side of his mouth. "like pepper". This was followed by a loud ringing in his ears. He then noticed that his right side went numb as if it had fallen asleep. They went to [facility] in [place], [place] where he was evaluated. TPA was not felt to be indicated as his deficits were fairly mild. He was transferred to [facility] for further evaluation per his request. The patient takes aspirin 81 mg daily and states that he never misses a dose. He recently saw his local physician for dyspnea on exertion. During that visit it was noted that his blood pressure was elevated. His medications were adjusted and a new medication was added for hypertension. He continued to have right-sided numbness which has not really improved. He has been ambulating with a walker since the incident as he does not feel steady on his feet; however, he denies weakness. Prior to this he walked independently for the most part with only occasional use of a cane. Date last known well: Date: 12/23/2014 Time last known well: Time: 23:00 tPA Given: No: Deficits were felt to be mild.  MRI scan of the brain showed an acute non-hemorrhagic left paracentral pontine  infarct with mild changes of small vessel disease and generalized cerebral atrophy. MRA showed occluded left vertebral artery. The tiny bulges noted in the cavernous segments of the internal carotid artery bilaterally probably due to atherosclerotic changes versus aneurysms. Carotid Doppler showed no significant extracranial stenosis and left vertebral artery had retrograde flow and patient had known left subclavian stenosis. LDL cholesterol was elevated at 136 and hemoglobin A1c was 6.3. Transthoracic echo showed ejection fraction of 55-60% without significant wall motion abnormalities. He was started on Plavix for stroke prevention and Lipitor for elevated lipids. Patient states his done well since discharge. He however continues to have right-sided numbness which is bothersome at times and he still has some trouble walking. He is not happy with his blood pressure control which still remains high and today it is 156/69. He is tolerating Plavix well without bleeding or bruising. Update 11/07/2015 : He returns for follow-up after last visit 6 months ago. Continues to do well without recurrent stroke or TIA symptoms but does have persistent numbness in his right arm mostly but to some degree in the right leg as well as right side of face as well. He discontinued gabapentin as he developed blurred vision which he felt was related to the medicine. He did notice some improvement in his paresthesias however. He has been seen by ophthalmologist who found some fluid behind his eyes and felt gabapentin was to blame. Patient states despite his paresthesias is still able to function fully without restrictions and is independent in activities of daily living. He  does use a cane to walk mainly because of bad knees. He is had no falls and is careful. He feels his balance is not right. The parent the patient was on Plavix but he has just discontinued it for a few days in preparation for upper endoscopy for evaluation for  heartburn. He is blood pressure is well controlled today it is 133/87. He remains on Lipitor and is tolerating it well without side effects. Last lipid profile was satisfactory.  ROS:   14 system review of systems is positive for  shortness of breath, numbness and all other systems negative  PMH:  Past Medical History  Diagnosis Date  . Hypertension   . Hyperlipemia   . History of stress test 09/02/2009    abnormal myocardial perfusion scan demonstrating an attenuation defect in the inferior region of the myocardium. No ischemia or infarct/scar is seen in the remaining myocardium. No prior study available for comparison. Abnormal myocardial study EF 79%  . History of Doppler ultrasound     dopplers revealed mild left ICA stenosis which has remained stable, occluded left subclavian artery with retrograde left vertebral filling and moderately severe right subclavian artery stenosis. he has no symptoms of upper extermity claudication or subclavian steal symptoms.   . Subclavian artery stenosis ([place])   . Stroke [facility])     Social History:  Social History   Social History  . Marital Status: Married    Spouse Name: N/A  . Number of Children: N/A  . Years of Education: N/A   Occupational History  . Not on file.   Social History Main Topics  . Smoking status: Former Smoker    Quit date: 06/03/1993  . Smokeless tobacco: Never Used  . Alcohol Use: No  . Drug Use: No  . Sexual Activity: Not on file   Other Topics Concern  . Not on file   Social History Narrative    Medications:   Current Outpatient Prescriptions on File Prior to Visit  Medication Sig Dispense Refill  . atorvastatin (LIPITOR) 20 MG tablet Take 1 tablet (20 mg total) by mouth daily at 6 PM. 30 tablet 1  . clopidogrel (PLAVIX) 75 MG tablet Take 1 tablet (75 mg total) by mouth daily. 30 tablet 1  . Coenzyme Q10 (COQ10) 100 MG CAPS Take by mouth.    . cyanocobalamin 500 MCG tablet Take 500 mcg by mouth daily.    .  dorzolamide (TRUSOPT) 2 % ophthalmic solution Place 1 drop into both eyes 2 (two) times daily.    . finasteride (PROSCAR) 5 MG tablet Take 5 mg by mouth daily.  10  . gabapentin (NEURONTIN) 100 MG capsule Take 1 capsule (100 mg total) by mouth 3 (three) times daily. 90 capsule 3  . latanoprost (XALATAN) 0.005 % ophthalmic solution Place 1 drop into both eyes at bedtime.    . magnesium oxide (MAG-OX) 400 MG tablet Take 400 mg by mouth daily.    . meloxicam (MOBIC) 7.5 MG tablet Take 7.5 mg by mouth daily as needed. For pain  5  . metoprolol succinate (TOPROL-XL) 25 MG 24 hr tablet TAKE ONE TABLET (25 MG TOTAL) BY MOUTH DAILY.    Marland Kitchen terazosin (HYTRIN) 10 MG capsule Take 10 mg by mouth at bedtime.    . timolol (TIMOPTIC) 0.5 % ophthalmic solution Place 1 drop into both eyes 2 (two) times daily.  3  . ULORIC 40 MG tablet Take 40 mg by mouth daily. To prevent gout  5  .  Vitamin D, Ergocalciferol, (DRISDOL) 50000 UNITS CAPS Take 50,000 Units by mouth every 7 (seven) days.     No current facility-administered medications on file prior to visit.    Allergies:   Allergies  Allergen Reactions  . Codeine Other (See Comments) and Anaphylaxis    Swollen tongue, numb around mouth, slurred speech Swollen tongue, numb around mouth, slurred speech  . Allopurinol Other (See Comments)    Hand pain  . Simvastatin Other (See Comments)    myalgias  . Uloric [Febuxostat] Other (See Comments)    Stomach pain    Physical Exam General: well developed, well nourished elderly Caucasian male, seated, in no evident distress Head: head normocephalic and atraumatic.  Neck: supple with no carotid but right greater than left supraclavicular bruits Cardiovascular: regular rate and rhythm, no murmurs Musculoskeletal: no deformity Skin:  no rash/petichiae Vascular:  Normal pulses all extremities Filed Vitals:   11/07/15 1001  BP: 133/57  Pulse: 54   Neurologic Exam Mental Status: Awake and fully alert. Oriented  to place and time. Recent and remote memory intact. Attention span, concentration and fund of knowledge appropriate. Mood and affect appropriate.  Cranial Nerves: Fundoscopic exam not done. Pupils equal, briskly reactive to light. Extraocular movements full without nystagmus. Mild mechanical ptosis left eye Visual fields full to confrontation. Hearing intact. Facial sensation intact. Face, tongue, palate moves normally and symmetrically.  Motor: Normal bulk and tone. Normal strength in all tested extremity muscles. Sensory.: intact to touch ,pinprick .position and vibratory sensation. Mild subjective paresthesias in the right arm but no objective sensory loss Coordination: Rapid alternating movements normal in all extremities. Finger-to-nose and heel-to-shin performed accurately bilaterally. Gait and Station: Arises from chair without difficulty. Stance is normal. Gait demonstrates normal stride length and balance . Unable to heel, toe and tandem walk without difficulty.  Reflexes: 1+ and symmetric. Toes downgoing.   NIHSS  0 Modified Rankin  1   ASSESSMENT: 57 year Caucasian male with left pontine infarct secondary to small vessel disease with vascular risk factors of hypertension hyperlipidemia ,  Subclavian  vascular disease,and with now post stroke paresthesias.    PLAN: I had a long d/w patient about his remote stroke, risk for recurrent stroke/TIAs, personally independently reviewed imaging studies and stroke evaluation results and answered questions.Continue Plavix 75 mg daily for secondary stroke prevention and maintain strict control of hypertension with blood pressure goal below 130/90, diabetes with hemoglobin A1c goal below 6.5% and lipids with LDL cholesterol goal below 70 mg/dL. I also advised the patient to eat a healthy diet with plenty of whole grains, cereals, fruits and vegetables, exercise regularly and maintain ideal body weight. I also prescribed Topamax 25 mg twice daily to  help with his post stroke paresthesias. I have discussed possible side effects with the patient and advised him to call me if needed. Check carotid ultrasound to follow his carotid bruits. Greater than 50% time during this 25 minute visit was spent on counseling and coordination of care about stroke risk and prevention Follow-up in the future with nurse practitioner in 3 months or call earlier if necessary.      Antony Contras, MD Note: This document was prepared with digital dictation and possible smart phrase technology. Any transcriptional errors that result from this process are unintentional

## 2015-11-09 ENCOUNTER — Encounter: Payer: Self-pay | Admitting: *Deleted

## 2015-11-10 ENCOUNTER — Ambulatory Visit
Admission: RE | Admit: 2015-11-10 | Discharge: 2015-11-10 | Disposition: A | Discharge status: Home / Self Care | Payer: Medicare Other | Source: Ambulatory Visit | Attending: Unknown Physician Specialty | Admitting: Unknown Physician Specialty

## 2015-11-10 ENCOUNTER — Ambulatory Visit: Payer: Medicare Other | Admitting: Anesthesiology

## 2015-11-10 ENCOUNTER — Encounter
Admission: RE | Disposition: A | Discharge status: Home / Self Care | Payer: Self-pay | Source: Ambulatory Visit | Attending: Unknown Physician Specialty

## 2015-11-10 ENCOUNTER — Encounter: Payer: Self-pay | Admitting: Anesthesiology

## 2015-11-10 DIAGNOSIS — Z8673 Personal history of transient ischemic attack (TIA), and cerebral infarction without residual deficits: Secondary | ICD-10-CM | POA: Diagnosis not present

## 2015-11-10 DIAGNOSIS — K2970 Gastritis, unspecified, without bleeding: Secondary | ICD-10-CM | POA: Insufficient documentation

## 2015-11-10 DIAGNOSIS — I739 Peripheral vascular disease, unspecified: Secondary | ICD-10-CM | POA: Insufficient documentation

## 2015-11-10 DIAGNOSIS — Z85828 Personal history of other malignant neoplasm of skin: Secondary | ICD-10-CM | POA: Diagnosis not present

## 2015-11-10 DIAGNOSIS — R933 Abnormal findings on diagnostic imaging of other parts of digestive tract: Secondary | ICD-10-CM | POA: Diagnosis present

## 2015-11-10 DIAGNOSIS — Z885 Allergy status to narcotic agent status: Secondary | ICD-10-CM | POA: Diagnosis not present

## 2015-11-10 DIAGNOSIS — C155 Malignant neoplasm of lower third of esophagus: Secondary | ICD-10-CM | POA: Insufficient documentation

## 2015-11-10 DIAGNOSIS — Z7902 Long term (current) use of antithrombotics/antiplatelets: Secondary | ICD-10-CM | POA: Insufficient documentation

## 2015-11-10 DIAGNOSIS — E785 Hyperlipidemia, unspecified: Secondary | ICD-10-CM | POA: Diagnosis not present

## 2015-11-10 DIAGNOSIS — I10 Essential (primary) hypertension: Secondary | ICD-10-CM | POA: Insufficient documentation

## 2015-11-10 DIAGNOSIS — Z79899 Other long term (current) drug therapy: Secondary | ICD-10-CM | POA: Diagnosis not present

## 2015-11-10 DIAGNOSIS — M1990 Unspecified osteoarthritis, unspecified site: Secondary | ICD-10-CM | POA: Diagnosis not present

## 2015-11-10 DIAGNOSIS — K269 Duodenal ulcer, unspecified as acute or chronic, without hemorrhage or perforation: Secondary | ICD-10-CM | POA: Diagnosis not present

## 2015-11-10 DIAGNOSIS — Z87891 Personal history of nicotine dependence: Secondary | ICD-10-CM | POA: Insufficient documentation

## 2015-11-10 HISTORY — DX: Malignant (primary) neoplasm, unspecified: C80.1

## 2015-11-10 HISTORY — DX: Unspecified osteoarthritis, unspecified site: M19.90

## 2015-11-10 HISTORY — PX: ESOPHAGOGASTRODUODENOSCOPY (EGD) WITH PROPOFOL: SHX5813

## 2015-11-10 SURGERY — ESOPHAGOGASTRODUODENOSCOPY (EGD) WITH PROPOFOL
Anesthesia: General

## 2015-11-10 MED ORDER — PROPOFOL 500 MG/50ML IV EMUL
INTRAVENOUS | Status: DC | PRN
  Administered 2015-11-10: 50 ug/kg/min via INTRAVENOUS

## 2015-11-10 MED ORDER — LIDOCAINE HCL (PF) 2 % IJ SOLN
INTRAMUSCULAR | Status: DC | PRN
  Administered 2015-11-10: 50 mg

## 2015-11-10 MED ORDER — SODIUM CHLORIDE 0.9 % IV SOLN: INTRAVENOUS | Status: DC

## 2015-11-10 MED ORDER — FENTANYL CITRATE (PF) 100 MCG/2ML IJ SOLN
INTRAMUSCULAR | Status: DC | PRN
  Administered 2015-11-10: 25 ug via INTRAVENOUS

## 2015-11-10 MED ORDER — SODIUM CHLORIDE 0.9 % IV SOLN
INTRAVENOUS | Status: DC
  Administered 2015-11-10: 1000 mL via INTRAVENOUS

## 2015-11-10 MED ORDER — PROPOFOL 10 MG/ML IV BOLUS
INTRAVENOUS | Status: DC | PRN
  Administered 2015-11-10 (×2): 10 mg via INTRAVENOUS
  Administered 2015-11-10: 20 mg via INTRAVENOUS

## 2015-11-10 NOTE — Op Note (Signed)
[facility] Patient Name: Marcus [name] Procedure Date: 11/10/2015 11:36 AM MRN: [id number] Account #: [id number] Date of Birth: 08-24-1931 Admit Type: Outpatient Age: 82 Room: [facility] ENDO ROOM 4 Gender: Male Note Status: Finalized Procedure:            Upper GI endoscopy Indications:          Abnormal UGI series Providers:            Manya Silvas, MD Referring MD:         Chesley Noon (Referring MD) Medicines:            Propofol per Anesthesia Complications:        No immediate complications. Procedure:            Pre-Anesthesia Assessment:                       - After reviewing the risks and benefits, the patient                        was deemed in satisfactory condition to undergo the                        procedure.                       After obtaining informed consent, the endoscope was                        passed under direct vision. Throughout the procedure,                        the patient's blood pressure, pulse, and oxygen                        saturations were monitored continuously. The Endoscope                        was introduced through the mouth, and advanced to the                        second part of duodenum. The upper GI endoscopy was                        accomplished without difficulty. The patient tolerated                        the procedure well. Findings:      A medium-sized, fungating, submucosal and ulcerating mass with no       bleeding and no stigmata of recent bleeding was found in the distal       esophagus, 35 cm from the incisors. The mass was partially obstructing       and not circumferential. Runs for about 4 cm. Biopsies were taken with a       cold forceps for histology.      Diffuse mild inflammation characterized by erythema and granularity was       found in the gastric body.      Few non-bleeding superficial duodenal ulcers with no stigmata of       bleeding were found in the  duodenal bulb. 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 Biopsied.                       - Gastritis.                       - Multiple non-bleeding duodenal ulcers with no                        stigmata of bleeding. Recommendation:       - Await pathology results. Soft diet with lots of                        liquids. Manya Silvas, MD 11/10/2015 11:56:53 AM This report has been signed electronically. Number of Addenda: 0 Note Initiated On: 11/10/2015 11:36 AM      [facility]

## 2015-11-10 NOTE — Anesthesia Preprocedure Evaluation (Signed)
Anesthesia Evaluation  Patient identified by MRN, date of birth, ID band Patient awake    Reviewed: Allergy & Precautions, H&P , NPO status , Patient's Chart, lab work & pertinent test results  History of Anesthesia Complications Negative for: history of anesthetic complications  Airway Mallampati: III  TM Distance: <3 FB Neck ROM: limited    Dental  (+) Poor Dentition, Chipped, Missing, Upper Dentures   Pulmonary neg shortness of breath, former smoker,    Pulmonary exam normal breath sounds clear to auscultation       Cardiovascular Exercise Tolerance: Good hypertension, (-) angina+ Peripheral Vascular Disease  (-) Past MI and (-) PND Normal cardiovascular exam Rhythm:regular Rate:Normal     Neuro/Psych CVA, Residual Symptoms negative psych ROS   GI/Hepatic negative GI ROS, Neg liver ROS,   Endo/Other  negative endocrine ROS  Renal/GU Renal disease  negative genitourinary   Musculoskeletal   Abdominal   Peds  Hematology negative hematology ROS (+)   Anesthesia Other Findings Past Medical History:   Hypertension                                                 Hyperlipemia                                                 History of stress test                          09/02/2009      Comment:abnormal myocardial perfusion scan               demonstrating an attenuation defect in the               inferior region of the myocardium. No ischemia               or infarct/scar is seen in the remaining               myocardium. No prior study available for               comparison. Abnormal myocardial study EF 79%   History of Doppler ultrasound                                  Comment:dopplers revealed mild left ICA stenosis which               has remained stable, occluded left subclavian               artery with retrograde left vertebral filling               and moderately severe right subclavian artery      stenosis. he has no symptoms of upper extermity              claudication or subclavian steal symptoms.    Subclavian artery stenosis (HCC)                             Stroke ([place])  Arthritis                                                    Cancer ([place])                                                   Comment:SKIN  Past Surgical History:   CATARACT EXTRACTION                                           FRACTURE SURGERY                                              MOHS SURGERY                                                 BMI    Body Mass Index   26.30 kg/m 2      Reproductive/Obstetrics negative OB ROS                             Anesthesia Physical Anesthesia Plan  ASA: III  Anesthesia Plan: General   Post-op Pain Management:    Induction:   Airway Management Planned:   Additional Equipment:   Intra-op Plan:   Post-operative Plan:   Informed Consent: I have reviewed the patients History and Physical, chart, labs and discussed the procedure including the risks, benefits and alternatives for the proposed anesthesia with the patient or authorized representative who has indicated his/her understanding and acceptance.   Dental Advisory Given  Plan Discussed with: Anesthesiologist, CRNA and Surgeon  Anesthesia Plan Comments:         Anesthesia Quick Evaluation

## 2015-11-10 NOTE — Transfer of Care (Signed)
Immediate Anesthesia Transfer of Care Note  Patient: Marcus [name]  Procedure(s) Performed: Procedure(s): ESOPHAGOGASTRODUODENOSCOPY (EGD) WITH PROPOFOL (N/A)  Patient Location: PACU  Anesthesia Type:General  Level of Consciousness: sedated  Airway & Oxygen Therapy: Patient Spontanous Breathing and Patient connected to nasal cannula oxygen  Post-op Assessment: Report given to RN and Post -op Vital signs reviewed and stable  Post vital signs: Reviewed and stable  Last Vitals:  Filed Vitals:   11/10/15 0954  BP: 152/66  Pulse: 57  Temp: 36.2 C  Resp: 18    Last Pain: There were no vitals filed for this visit.       Complications: No apparent anesthesia complications

## 2015-11-10 NOTE — Progress Notes (Signed)
  Oncology Nurse Navigator Documentation  Navigator Location: [facility] (11/10/15 1600) Navigator Encounter Type: Other (GI contact for referral ) (11/10/15 1600)   Abnormal Finding Date: 11/10/15 (11/10/15 1600)           Barriers/Navigation Needs: Coordination of Care (11/10/15 1600)   Interventions: Referrals;Coordination of Care (11/10/15 1600)            Acuity: Level 2 (11/10/15 1600)         Time Spent with Patient: 30 (11/10/15 1600)   [facility] scheduling received call from spouse requesting appt for esophageal mass. Noted that Marcus [name] had EGD today with partially obstructing mass. Pathology pending. Dorine NP at [facility] sent message asking for referral. Contacted Marcus [name] and will call her Monday with appt date/time

## 2015-11-10 NOTE — H&P (Signed)
Primary Care Physician:  Chesley Noon, MD Primary Gastroenterologist:  Dr. Vira Agar  Pre-Procedure History & Physical: HPI:  Marcus [name] is a 82 y.o. male is here for an endoscopy.   Past Medical History  Diagnosis Date  . Hypertension   . Hyperlipemia   . History of stress test 09/02/2009    abnormal myocardial perfusion scan demonstrating an attenuation defect in the inferior region of the myocardium. No ischemia or infarct/scar is seen in the remaining myocardium. No prior study available for comparison. Abnormal myocardial study EF 79%  . History of Doppler ultrasound     dopplers revealed mild left ICA stenosis which has remained stable, occluded left subclavian artery with retrograde left vertebral filling and moderately severe right subclavian artery stenosis. he has no symptoms of upper extermity claudication or subclavian steal symptoms.   . Subclavian artery stenosis ([place])   . Stroke ([place])   . Arthritis   . Cancer [facility])     SKIN    Past Surgical History  Procedure Laterality Date  . Cataract extraction    . Fracture surgery    . Mohs surgery      Prior to Admission medications   Medication Sig Start Date End Date Taking? Authorizing Provider  amLODipine (NORVASC) 2.5 MG tablet Take 2.5 mg by mouth every morning. 10/12/15  Yes Historical Provider, MD  atorvastatin (LIPITOR) 20 MG tablet Take 1 tablet (20 mg total) by mouth daily at 6 PM. 12/26/14  Yes Delfina Redwood, MD  clopidogrel (PLAVIX) 75 MG tablet Take 1 tablet (75 mg total) by mouth daily. 12/26/14  Yes Delfina Redwood, MD  cyanocobalamin 500 MCG tablet Take 500 mcg by mouth daily.   Yes Historical Provider, MD  dorzolamide (TRUSOPT) 2 % ophthalmic solution Place 1 drop into both eyes 2 (two) times daily.   Yes Historical Provider, MD  doxazosin (CARDURA) 2 MG tablet Take 2 mg by mouth daily.   Yes Historical Provider, MD  finasteride (PROSCAR) 5 MG tablet Take 5 mg by mouth daily. 12/06/14  Yes  Historical Provider, MD  gabapentin (NEURONTIN) 100 MG capsule Take 1 capsule (100 mg total) by mouth 3 (three) times daily. 04/26/15  Yes Garvin Fila, MD  latanoprost (XALATAN) 0.005 % ophthalmic solution Place 1 drop into both eyes at bedtime.   Yes Historical Provider, MD  magnesium oxide (MAG-OX) 400 MG tablet Take 400 mg by mouth daily.   Yes Historical Provider, MD  meloxicam (MOBIC) 7.5 MG tablet Take 7.5 mg by mouth daily as needed. For pain 11/01/14  Yes Historical Provider, MD  metoprolol succinate (TOPROL-XL) 25 MG 24 hr tablet TAKE ONE TABLET (25 MG TOTAL) BY MOUTH DAILY. 04/19/15  Yes Historical Provider, MD  terazosin (HYTRIN) 10 MG capsule Take 10 mg by mouth at bedtime.   Yes Historical Provider, MD  timolol (TIMOPTIC) 0.5 % ophthalmic solution Place 1 drop into both eyes 2 (two) times daily. 10/27/14  Yes Historical Provider, MD  topiramate (TOPAMAX) 25 MG tablet Take 1 tablet (25 mg total) by mouth 2 (two) times daily. 11/07/15  Yes Garvin Fila, MD  ULORIC 40 MG tablet Take 40 mg by mouth daily. To prevent gout 11/01/14  Yes Historical Provider, MD  Vitamin D, Ergocalciferol, (DRISDOL) 50000 UNITS CAPS Take 50,000 Units by mouth every 7 (seven) days.   Yes Historical Provider, MD  Coenzyme Q10 (COQ10) 100 MG CAPS Take by mouth. Reported on 11/10/2015    Historical Provider, MD  losartan (  COZAAR) 100 MG tablet Take 100 mg by mouth daily. Reported on 11/10/2015    Historical Provider, MD    Allergies as of 11/07/2015 - Review Complete 11/07/2015  Allergen Reaction Noted  . Codeine Other (See Comments) and Anaphylaxis 12/03/2012  . Allopurinol Other (See Comments) 12/24/2014  . Simvastatin Other (See Comments) 12/24/2014  . Uloric [febuxostat] Other (See Comments) 12/24/2014    Family History  Problem Relation Age of Onset  . Heart disease Brother   . Cancer - Colon Sister   . Cancer Sister   . Other Brother     CABG    Social History   Social History  . Marital Status:  Married    Spouse Name: N/A  . Number of Children: N/A  . Years of Education: N/A   Occupational History  . Not on file.   Social History Main Topics  . Smoking status: Former Smoker    Quit date: 06/03/1993  . Smokeless tobacco: Never Used  . Alcohol Use: No  . Drug Use: No  . Sexual Activity: Not on file   Other Topics Concern  . Not on file   Social History Narrative    Review of Systems: See HPI, otherwise negative ROS  Physical Exam: BP 152/66 mmHg  Pulse 57  Temp(Src) 97.1 F (36.2 C) (Tympanic)  Resp 18  Ht 6\' 2"  (1.88 m)  Wt 92.987 kg (205 lb)  BMI 26.31 kg/m2  SpO2 98% General:   Alert,  pleasant and cooperative in NAD Head:  Normocephalic and atraumatic. Neck:  Supple; no masses or thyromegaly. Lungs:  Clear throughout to auscultation.    Heart:  Regular rate and rhythm. Abdomen:  Soft, nontender and nondistended. Normal bowel sounds, without guarding, and without rebound.   Neurologic:  Alert and  oriented x4;  grossly normal neurologically.  Impression/Plan: Marcus [name] is here for an endoscopy to be performed for abnormal barium study suggesting cancer.  Risks, benefits, limitations, and alternatives regarding  endoscopy have been reviewed with the patient.  Questions have been answered.  All parties agreeable.   Gaylyn Cheers, MD  11/10/2015, 11:34 AM

## 2015-11-12 NOTE — Anesthesia Postprocedure Evaluation (Signed)
Anesthesia Post Note  Patient: Marcus [name]  Procedure(s) Performed: Procedure(s) (LRB): ESOPHAGOGASTRODUODENOSCOPY (EGD) WITH PROPOFOL (N/A)  Patient location during evaluation: Endoscopy Anesthesia Type: General Level of consciousness: awake and alert Pain management: pain level controlled Vital Signs Assessment: post-procedure vital signs reviewed and stable Respiratory status: spontaneous breathing, nonlabored ventilation, respiratory function stable and patient connected to nasal cannula oxygen Cardiovascular status: blood pressure returned to baseline and stable Postop Assessment: no signs of nausea or vomiting Anesthetic complications: no    Last Vitals:  Filed Vitals:   11/10/15 1215 11/10/15 1225  BP: 163/71 176/74  Pulse: 59 57  Temp:    Resp: 16 15    Last Pain: There were no vitals filed for this visit.               Precious Haws Desha Bitner

## 2015-11-14 ENCOUNTER — Telehealth: Payer: Self-pay

## 2015-11-14 NOTE — Telephone Encounter (Signed)
  Oncology Nurse Navigator Documentation  Navigator Location: [facility] (11/14/15 0900) Navigator Encounter Type: Introductory phone call;Telephone (11/14/15 0900) Telephone: Marcus [name] Call;Appt Confirmation/Clarification (11/14/15 0900)   Confirmed Diagnosis Date: 11/13/15 (11/14/15 0900)         Barriers/Navigation Needs: No barriers at this time;Education;Coordination of Care (11/14/15 0900) Education: Newly Diagnosed Cancer Education (11/14/15 0900) Interventions: Coordination of Care (11/14/15 0900)   Coordination of Care: Appts (11/14/15 0900)        Acuity: Level 2 (11/14/15 0900)   Acuity Level 2: Initial guidance, education and coordination as needed;Educational needs;Assistance expediting appointments;Ongoing guidance and education throughout treatment as needed (11/14/15 0900)     Time Spent with Patient: 30 (11/14/15 0900)   Spoke with spouse. 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 Answered some questions regarding diagnosis. Staging to be done by Dr Rogue Bussing. Consult arranged for 6-14 at 11:30. Directions to cancer center given. Introduced Therapist, nutritional and provided my contact information for future questions or concerns.

## 2015-11-15 ENCOUNTER — Telehealth: Payer: Self-pay

## 2015-11-15 ENCOUNTER — Encounter: Payer: Self-pay | Admitting: Internal Medicine

## 2015-11-15 ENCOUNTER — Inpatient Hospital Stay: Payer: Medicare Other

## 2015-11-15 ENCOUNTER — Inpatient Hospital Stay: Payer: Medicare Other | Attending: Internal Medicine | Admitting: Internal Medicine

## 2015-11-15 VITALS — BP 163/77 | HR 61 | Temp 97.7°F | Resp 18 | Wt 210.1 lb

## 2015-11-15 DIAGNOSIS — M109 Gout, unspecified: Secondary | ICD-10-CM | POA: Insufficient documentation

## 2015-11-15 DIAGNOSIS — Z87891 Personal history of nicotine dependence: Secondary | ICD-10-CM | POA: Insufficient documentation

## 2015-11-15 DIAGNOSIS — Z8673 Personal history of transient ischemic attack (TIA), and cerebral infarction without residual deficits: Secondary | ICD-10-CM | POA: Insufficient documentation

## 2015-11-15 DIAGNOSIS — Z86718 Personal history of other venous thrombosis and embolism: Secondary | ICD-10-CM | POA: Diagnosis not present

## 2015-11-15 DIAGNOSIS — D631 Anemia in chronic kidney disease: Secondary | ICD-10-CM | POA: Diagnosis not present

## 2015-11-15 DIAGNOSIS — H3530 Unspecified macular degeneration: Secondary | ICD-10-CM | POA: Insufficient documentation

## 2015-11-15 DIAGNOSIS — Z79899 Other long term (current) drug therapy: Secondary | ICD-10-CM | POA: Diagnosis not present

## 2015-11-15 DIAGNOSIS — N183 Chronic kidney disease, stage 3 unspecified: Secondary | ICD-10-CM

## 2015-11-15 DIAGNOSIS — E785 Hyperlipidemia, unspecified: Secondary | ICD-10-CM | POA: Diagnosis not present

## 2015-11-15 DIAGNOSIS — I129 Hypertensive chronic kidney disease with stage 1 through stage 4 chronic kidney disease, or unspecified chronic kidney disease: Secondary | ICD-10-CM | POA: Diagnosis not present

## 2015-11-15 DIAGNOSIS — C155 Malignant neoplasm of lower third of esophagus: Secondary | ICD-10-CM

## 2015-11-15 DIAGNOSIS — N400 Enlarged prostate without lower urinary tract symptoms: Secondary | ICD-10-CM | POA: Diagnosis not present

## 2015-11-15 DIAGNOSIS — H409 Unspecified glaucoma: Secondary | ICD-10-CM | POA: Insufficient documentation

## 2015-11-15 DIAGNOSIS — K219 Gastro-esophageal reflux disease without esophagitis: Secondary | ICD-10-CM | POA: Diagnosis not present

## 2015-11-15 DIAGNOSIS — Z01818 Encounter for other preprocedural examination: Secondary | ICD-10-CM

## 2015-11-15 LAB — CBC WITH DIFFERENTIAL/PLATELET
BASOS PCT: 1 %
Basophils Absolute: 0 10*3/uL (ref 0–0.1)
Eosinophils Absolute: 0.1 10*3/uL (ref 0–0.7)
Eosinophils Relative: 2 %
HEMATOCRIT: 39.5 % — AB (ref 40.0–52.0)
HEMOGLOBIN: 13.7 g/dL (ref 13.0–18.0)
LYMPHS ABS: 1 10*3/uL (ref 1.0–3.6)
LYMPHS PCT: 17 %
MCH: 34.4 pg — ABNORMAL HIGH (ref 26.0–34.0)
MCHC: 34.6 g/dL (ref 32.0–36.0)
MCV: 99.5 fL (ref 80.0–100.0)
MONOS PCT: 8 %
Monocytes Absolute: 0.4 10*3/uL (ref 0.2–1.0)
NEUTROS ABS: 4.1 10*3/uL (ref 1.4–6.5)
NEUTROS PCT: 72 %
Platelets: 172 10*3/uL (ref 150–440)
RBC: 3.97 MIL/uL — ABNORMAL LOW (ref 4.40–5.90)
RDW: 13.7 % (ref 11.5–14.5)
WBC: 5.6 10*3/uL (ref 3.8–10.6)

## 2015-11-15 LAB — COMPREHENSIVE METABOLIC PANEL
ALBUMIN: 4.2 g/dL (ref 3.5–5.0)
ALT: 15 U/L — ABNORMAL LOW (ref 17–63)
ANION GAP: 8 (ref 5–15)
AST: 19 U/L (ref 15–41)
Alkaline Phosphatase: 98 U/L (ref 38–126)
BUN: 33 mg/dL — ABNORMAL HIGH (ref 6–20)
CALCIUM: 8.8 mg/dL — AB (ref 8.9–10.3)
CHLORIDE: 106 mmol/L (ref 101–111)
CO2: 23 mmol/L (ref 22–32)
Creatinine, Ser: 1.74 mg/dL — ABNORMAL HIGH (ref 0.61–1.24)
GFR calc non Af Amer: 35 mL/min — ABNORMAL LOW (ref >60)
GFR, EST AFRICAN AMERICAN: 40 mL/min — AB (ref >60)
Glucose, Bld: 104 mg/dL — ABNORMAL HIGH (ref 65–99)
POTASSIUM: 5.3 mmol/L — AB (ref 3.5–5.1)
SODIUM: 137 mmol/L (ref 135–145)
Total Bilirubin: 0.8 mg/dL (ref 0.3–1.2)
Total Protein: 8.4 g/dL — ABNORMAL HIGH (ref 6.5–8.1)

## 2015-11-15 LAB — PROTIME-INR
INR: 1.1
PROTHROMBIN TIME: 14.4 s (ref 11.4–15.0)

## 2015-11-15 LAB — APTT: aPTT: 29 seconds (ref 24–36)

## 2015-11-15 NOTE — Assessment & Plan Note (Addendum)
Moderately differentiated adenocarcinoma lower esophagus-fairly asymptomatic except for reflux disease. Recommend PET scan; based on PET scan recommend EUS. Recommend checking CBC CMP and CEA today.  Discussed that surgery is unlikely an option given his age. In general, if localized disease we would recommend chemotherapy along with radiation-definitive. Even if this is advanced- still might chemoradiation for local control and symptom management. However no definite cancer have been made at this time.   Discussed the use of Xeloda or 5-FU pump-concurrent with radiation. Recommend a port/reference to IR.  Await radiation oncology evaluation. Patient will tentatively follow-up with me in approximately 1 week to review above workup. The above plan of care was discussed with the patient and daughter in detail.

## 2015-11-15 NOTE — Assessment & Plan Note (Signed)
Most recent creatinine 1.4 await the labs today. This will need to be taken into consideration while patients of chemotherapy. Xeloda could be a problem.

## 2015-11-15 NOTE — Telephone Encounter (Signed)
  Oncology Nurse Navigator Documentation  Navigator Location: [facility] (11/15/15 1600) Navigator Encounter Type: Telephone (11/15/15 1600) Telephone: Appt Confirmation/Clarification (11/15/15 1600)                                        Time Spent with Patient: 15 (11/15/15 1600)   Received voicemail from spouse Marcus [name] regarding appt tomorrow at 0900. Left voicemail regarding it is with radiation oncology Dr Baruch Gouty and purpose for consult

## 2015-11-15 NOTE — Progress Notes (Signed)
  Oncology Nurse Navigator Documentation  Navigator Location: [facility] (11/15/15 1200) Navigator Encounter Type: Initial MedOnc (11/15/15 1200)           Patient Visit Type: MedOnc (11/15/15 1200) Treatment Phase: Pre-Tx/Tx Discussion (11/15/15 1200)                            Time Spent with Patient: 30 (11/15/15 1200)   Met with Marcus [name] and his spouse and daughter [name] prior to consult. Introduced Therapist, nutritional and provided them with my contact information any further questions or concerns.

## 2015-11-15 NOTE — Progress Notes (Signed)
New Referral from GI for newly dx adenocarcinoma esophageal cancer. Pt presented to GI with c/o GERD. He underwent a work up with EGD. He has no medical complaints today. He ambulates with the assist of a cane. He is accompanied by his wife and daughter in law.

## 2015-11-15 NOTE — Assessment & Plan Note (Addendum)
No residual motor deficits.

## 2015-11-15 NOTE — Progress Notes (Signed)
[facility] CONSULT NOTE  Patient Care Team: Chesley Noon, MD as PCP - General (Family Medicine)  CHIEF COMPLAINTS/PURPOSE OF CONSULTATION:    Esophageal cancer ([place])   11/15/2015 Initial Diagnosis Esophageal cancer ([place])- non-circumferential partially obstructing ulcerating mass- biopsy positive for adenocarcinoma.     HISTORY OF PRESENTING ILLNESS:  Marcus [name] 82 y.o.  male the history of stroke-with no residual motor deficits- noted to have epigastric discomfort and heartburn for the last 2-3 weeks which further led to endoscopic. Dr. Charolette Forward noted above. EGD showed non-circumferential partially obstructing ulcerating mass- biopsy positive for adenocarcinoma.  Patient denies any weight loss. Denies any difficulty swallowing food or liquids. No nausea no vomiting. No chest pain or shortness of breath or cough. No fevers or chills. Denies any tingling and numbness.  ROS: A complete 10 point review of system is done which is negative except mentioned above in history of present illness  MEDICAL HISTORY:  Past Medical History  Diagnosis Date  . Hypertension   . Hyperlipemia   . History of stress test 09/02/2009    abnormal myocardial perfusion scan demonstrating an attenuation defect in the inferior region of the myocardium. No ischemia or infarct/scar is seen in the remaining myocardium. No prior study available for comparison. Abnormal myocardial study EF 79%  . History of Doppler ultrasound     dopplers revealed mild left ICA stenosis which has remained stable, occluded left subclavian artery with retrograde left vertebral filling and moderately severe right subclavian artery stenosis. he has no symptoms of upper extermity claudication or subclavian steal symptoms.   . Subclavian artery stenosis ([place])   . Stroke ([place])   . Arthritis   . Cancer ([place])     SKIN  . Esophageal cancer ([place])   . GERD (gastroesophageal reflux disease)   . Glaucoma   . Macular  degeneration   . Aphakia of right eye   . History of CVA (cerebrovascular accident)     Cerebral infarction Nov. 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 Gout   . Enlarged prostate without lower urinary tract symptoms ([place])   . History of DVT (deep vein thrombosis)     right arm    SURGICAL HISTORY: Past Surgical History  Procedure Laterality Date  . Cataract extraction    . Fracture surgery    . Mohs surgery    . Esophagogastroduodenoscopy (egd) with propofol N/A 11/10/2015    Procedure: ESOPHAGOGASTRODUODENOSCOPY (EGD) WITH PROPOFOL;  Surgeon: Manya Silvas, MD;  Location: [facility];  Service: Endoscopy;  Laterality: N/A;  . Colonoscopy  2002    SOCIAL HISTORY: [place]; worked in office.  Social History   Social History  . Marital Status: Married    Spouse Name: N/A  . Number of Children: N/A  . Years of Education: N/A   Occupational History  . Not on file.   Social History Main Topics  . Smoking status: Former Smoker -- 1.00 packs/day for 40 years    Types: Cigarettes    Quit date: 06/03/1993  . Smokeless tobacco: Never Used  . Alcohol Use: No  . Drug Use: No  . Sexual Activity: Not on file   Other Topics Concern  . Not on file   Social History Narrative    FAMILY HISTORY: Family History  Problem Relation Age of Onset  . Heart disease Brother   . Cancer - Colon Sister   . Cancer Sister   . Other Brother  CABG    ALLERGIES:  is allergic to codeine; allopurinol; simvastatin; and uloric.  MEDICATIONS:  Current Outpatient Prescriptions  Medication Sig Dispense Refill  . atorvastatin (LIPITOR) 20 MG tablet Take 1 tablet (20 mg total) by mouth daily at 6 PM. 30 tablet 1  . clopidogrel (PLAVIX) 75 MG tablet Take 1 tablet (75 mg total) by mouth daily. 30 tablet 1  . Coenzyme Q10 (COQ10) 100 MG CAPS Take by mouth. Reported on 11/10/2015    . cyanocobalamin 500 MCG tablet Take 500 mcg by mouth daily.    . dorzolamide (TRUSOPT) 2 %  ophthalmic solution Place 1 drop into both eyes 2 (two) times daily.    Marland Kitchen doxazosin (CARDURA) 2 MG tablet Take 2 mg by mouth daily.    . finasteride (PROSCAR) 5 MG tablet Take 5 mg by mouth daily.  10  . latanoprost (XALATAN) 0.005 % ophthalmic solution Place 1 drop into both eyes at bedtime.    . magnesium oxide (MAG-OX) 400 MG tablet Take 400 mg by mouth daily.    . meloxicam (MOBIC) 7.5 MG tablet Take 7.5 mg by mouth daily as needed. For pain  5  . metoprolol succinate (TOPROL-XL) 25 MG 24 hr tablet TAKE ONE TABLET (25 MG TOTAL) BY MOUTH DAILY.    Marland Kitchen terazosin (HYTRIN) 10 MG capsule Take 10 mg by mouth at bedtime.    . timolol (TIMOPTIC) 0.5 % ophthalmic solution Place 1 drop into both eyes 2 (two) times daily.  3  . Vitamin D, Ergocalciferol, (DRISDOL) 50000 UNITS CAPS Take 50,000 Units by mouth every 7 (seven) days.     No current facility-administered medications for this visit.      Marland Kitchen  PHYSICAL EXAMINATION: ECOG PERFORMANCE STATUS: 1 - Symptomatic but completely ambulatory  Filed Vitals:   11/15/15 1153  BP: 163/77  Pulse: 61  Temp: 97.7 F (36.5 C)  Resp: 18   Filed Weights   11/15/15 1153  Weight: 210 lb 1.6 oz (95.3 kg)    GENERAL: Well-nourished well-developed; Alert, no distress and comfortable.   Accompanied by Marcus [name] /[name]  EYES: no pallor or icterus OROPHARYNX: no thrush or ulceration NECK: supple, no masses felt LYMPH:  no palpable lymphadenopathy in the cervical, axillary or inguinal regions LUNGS: clear to auscultation and  No wheeze or crackles HEART/CVS: regular rate & rhythm and no murmurs; No lower extremity edema ABDOMEN: abdomen soft, non-tender and normal bowel sounds Musculoskeletal:no cyanosis of digits and no clubbing  PSYCH: alert & oriented x 3 with fluent speech NEURO: no focal motor/sensory deficits SKIN:  no rashes or significant lesions  LABORATORY DATA:  I have reviewed the data as listed Lab Results  Component Value Date   WBC  5.6 11/15/2015   HGB 13.7 11/15/2015   HCT 39.5* 11/15/2015   MCV 99.5 11/15/2015   PLT 172 11/15/2015    Recent Labs  12/25/14 0750 12/26/14 0831  NA 137 137  K 5.2* 4.5  CL 109 108  CO2 20* 21*  GLUCOSE 107* 148*  BUN 29* 30*  CREATININE 1.34* 1.40*  CALCIUM 9.3 9.0  GFRNONAA 48* 46*  GFRAA 56* 53*  PROT 6.9  --   ALBUMIN 3.3*  --   AST 18  --   ALT 12*  --   ALKPHOS 82  --   BILITOT 0.5  --     RADIOGRAPHIC STUDIES: I have personally reviewed the radiological images as listed and agreed with the findings in the report. No results  found.  ASSESSMENT & PLAN:  Esophageal cancer ([place]) Moderately differentiated adenocarcinoma lower esophagus-fairly asymptomatic except for reflux disease. Recommend PET scan; based on PET scan recommend EUS. Recommend checking CBC CMP and CEA today.  Discussed that surgery is unlikely an option given Marcus age. In general, if localized disease we would recommend chemotherapy along with radiation-definitive. Even if this is advanced- still might chemoradiation for local control and symptom management. However no definite cancer have been made at this time.   Discussed the use of Xeloda or 5-FU pump-concurrent with radiation. Recommend a port/reference to IR.  Await radiation oncology evaluation. Patient will tentatively follow-up with me in approximately 1 week to review above workup. The above plan of care was discussed with the patient and [name] in detail.   History of stroke No residual motor deficits.   CKD (chronic kidney disease), stage III Most recent creatinine 1.4 await the labs today. This will need to be taken into consideration while patients of chemotherapy. Xeloda could be a problem.   Thank you Dr.Elloit  for allowing me to participate in the care of your pleasant patient. Please do not hesitate to contact me with questions or concerns in the interim.  # 60 minutes face-to-face with the patient discussing the above plan  of care; more than 50% of time spent on prognosis/ natural history; counseling and coordination.  CC: Dr.Badger; Lauralee Evener in [place].Cammie Sickle, MD 11/15/2015 1:27 PM

## 2015-11-15 NOTE — Progress Notes (Signed)
Patient here today as new evaluation regarding esophageal cancer.  Referred by Dr. Vira Agar.  Patient states he feels like he has heartburn.

## 2015-11-15 NOTE — Patient Instructions (Addendum)
Fluorouracil, 5-FU injection What is this medicine? FLUOROURACIL, 5-FU (flure oh YOOR a sil) is a chemotherapy drug. It slows the growth of cancer cells. This medicine is used to treat many types of cancer like breast cancer, colon or rectal cancer, pancreatic cancer, and stomach cancer. This medicine may be used for other purposes; ask your health care provider or pharmacist if you have questions. What should I tell my health care provider before I take this medicine? They need to know if you have any of these conditions: -blood disorders -dihydropyrimidine dehydrogenase (DPD) deficiency -infection (especially a virus infection such as chickenpox, cold sores, or herpes) -kidney disease -liver disease -malnourished, poor nutrition -recent or ongoing radiation therapy -an unusual or allergic reaction to fluorouracil, other chemotherapy, other medicines, foods, dyes, or preservatives -pregnant or trying to get pregnant -breast-feeding How should I use this medicine? This drug is given as an infusion or injection into a vein. It is administered in a hospital or clinic by a specially trained health care professional. Talk to your pediatrician regarding the use of this medicine in children. Special care may be needed. Overdosage: If you think you have taken too much of this medicine contact a poison control center or emergency room at once. NOTE: This medicine is only for you. Do not share this medicine with others. What if I miss a dose? It is important not to miss your dose. Call your doctor or health care professional if you are unable to keep an appointment. What may interact with this medicine? -allopurinol -cimetidine -dapsone -digoxin -hydroxyurea -leucovorin -levamisole -medicines for seizures like ethotoin, fosphenytoin, phenytoin -medicines to increase blood counts like filgrastim, pegfilgrastim, sargramostim -medicines that treat or prevent blood clots like warfarin,  enoxaparin, and dalteparin -methotrexate -metronidazole -pyrimethamine -some other chemotherapy drugs like busulfan, cisplatin, estramustine, vinblastine -trimethoprim -trimetrexate -vaccines Talk to your doctor or health care professional before taking any of these medicines: -acetaminophen -aspirin -ibuprofen -ketoprofen -naproxen This list may not describe all possible interactions. Give your health care provider a list of all the medicines, herbs, non-prescription drugs, or dietary supplements you use. Also tell them if you smoke, drink alcohol, or use illegal drugs. Some items may interact with your medicine. What should I watch for while using this medicine? Visit your doctor for checks on your progress. This drug may make you feel generally unwell. This is not uncommon, as chemotherapy can affect healthy cells as well as cancer cells. Report any side effects. Continue your course of treatment even though you feel ill unless your doctor tells you to stop. In some cases, you may be given additional medicines to help with side effects. Follow all directions for their use. Call your doctor or health care professional for advice if you get a fever, chills or sore throat, or other symptoms of a cold or flu. Do not treat yourself. This drug decreases your body's ability to fight infections. Try to avoid being around people who are sick. This medicine may increase your risk to bruise or bleed. Call your doctor or health care professional if you notice any unusual bleeding. Be careful brushing and flossing your teeth or using a toothpick because you may get an infection or bleed more easily. If you have any dental work done, tell your dentist you are receiving this medicine. Avoid taking products that contain aspirin, acetaminophen, ibuprofen, naproxen, or ketoprofen unless instructed by your doctor. These medicines may hide a fever. Do not become pregnant while taking this medicine. Women should    inform their doctor if they wish to become pregnant or think they might be pregnant. There is a potential for serious side effects to an unborn child. Talk to your health care professional or pharmacist for more information. Do not breast-feed an infant while taking this medicine. Men should inform their doctor if they wish to father a child. This medicine may lower sperm counts. Do not treat diarrhea with over the counter products. Contact your doctor if you have diarrhea that lasts more than 2 days or if it is severe and watery. This medicine can make you more sensitive to the sun. Keep out of the sun. If you cannot avoid being in the sun, wear protective clothing and use sunscreen. Do not use sun lamps or tanning beds/booths. What side effects may I notice from receiving this medicine? Side effects that you should report to your doctor or health care professional as soon as possible: -allergic reactions like skin rash, itching or hives, swelling of the face, lips, or tongue -low blood counts - this medicine may decrease the number of white blood cells, red blood cells and platelets. You may be at increased risk for infections and bleeding. -signs of infection - fever or chills, cough, sore throat, pain or difficulty passing urine -signs of decreased platelets or bleeding - bruising, pinpoint red spots on the skin, black, tarry stools, blood in the urine -signs of decreased red blood cells - unusually weak or tired, fainting spells, lightheadedness -breathing problems -changes in vision -chest pain -mouth sores -nausea and vomiting -pain, swelling, redness at site where injected -pain, tingling, numbness in the hands or feet -redness, swelling, or sores on hands or feet -stomach pain -unusual bleeding Side effects that usually do not require medical attention (report to your doctor or health care professional if they continue or are bothersome): -changes in finger or toe  nails -diarrhea -dry or itchy skin -hair loss -headache -loss of appetite -sensitivity of eyes to the light -stomach upset -unusually teary eyes This list may not describe all possible side effects. Call your doctor for medical advice about side effects. You may report side effects to FDA at 1-800-FDA-1088. Where should I keep my medicine? This drug is given in a hospital or clinic and will not be stored at home. NOTE: This sheet is a summary. It may not cover all possible information. If you have questions about this medicine, talk to your doctor, pharmacist, or health care provider.    2016, Elsevier/Gold Standard. (2007-09-23 13:53:16)         Capecitabine tablets What is this medicine? CAPECITABINE (ka pe SITE a been) is a chemotherapy drug. It slows the growth of cancer cells. This medicine is used to treat breast cancer, and also colon or rectal cancer. This medicine may be used for other purposes; ask your health care provider or pharmacist if you have questions. What should I tell my health care provider before I take this medicine? They need to know if you have any of these conditions: -bleeding or blood disorders -dihydropyrimidine dehydrogenase (DPD) deficiency -heart disease -infection (especially a virus infection such as chickenpox, cold sores, or herpes) -kidney disease -liver disease -an unusual or allergic reaction to capecitabine, 5-fluorouracil, other medicines, foods, dyes, or preservatives -pregnant or trying to get pregnant -breast-feeding How should I use this medicine? Take this medicine by mouth with a glass of water, within 30 minutes of the end of a meal. Do not cut, crush or chew this medicine. Follow the directions  on the prescription label. Take your medicine at regular intervals. Do not take it more often than directed. Do not stop taking except on your doctor's advice. Your doctor may want you to take a combination of 150 mg and 500 mg tablets  for each dose. It is very important that you know how to correctly take your dose. Taking the wrong tablets could result in an overdose (too much medication) or underdose (too little medication). Talk to your pediatrician regarding the use of this medicine in children. Special care may be needed. Overdosage: If you think you have taken too much of this medicine contact a poison control center or emergency room at once. NOTE: This medicine is only for you. Do not share this medicine with others. What if I miss a dose? If you miss a dose, do not take the missed dose at all. Do not take double or extra doses. Instead, continue with your next scheduled dose and check with your doctor. What may interact with this medicine? -antacids with aluminum and/or magnesium -folic acid -leucovorin -medicines to increase blood counts like filgrastim, pegfilgrastim, sargramostim -phenytoin -vaccines -warfarin Talk to your doctor or health care professional before taking any of these medicines: -acetaminophen -aspirin -ibuprofen -ketoprofen -naproxen This list may not describe all possible interactions. Give your health care provider a list of all the medicines, herbs, non-prescription drugs, or dietary supplements you use. Also tell them if you smoke, drink alcohol, or use illegal drugs. Some items may interact with your medicine. What should I watch for while using this medicine? Visit your doctor for checks on your progress. This drug may make you feel generally unwell. This is not uncommon, as chemotherapy can affect healthy cells as well as cancer cells. Report any side effects. Continue your course of treatment even though you feel ill unless your doctor tells you to stop. In some cases, you may be given additional medicines to help with side effects. Follow all directions for their use. Call your doctor or health care professional for advice if you get a fever, chills or sore throat, or other symptoms of  a cold or flu. Do not treat yourself. This drug decreases your body's ability to fight infections. Try to avoid being around people who are sick. This medicine may increase your risk to bruise or bleed. Call your doctor or health care professional if you notice any unusual bleeding. Be careful brushing and flossing your teeth or using a toothpick because you may get an infection or bleed more easily. If you have any dental work done, tell your dentist you are receiving this medicine. Avoid taking products that contain aspirin, acetaminophen, ibuprofen, naproxen, or ketoprofen unless instructed by your doctor. These medicines may hide a fever. Do not become pregnant while taking this medicine. Women should inform their doctor if they wish to become pregnant or think they might be pregnant. There is a potential for serious side effects to an unborn child. Talk to your health care professional or pharmacist for more information. Do not breast-feed an infant while taking this medicine. Men are advised not to father a child while taking this medicine. What side effects may I notice from receiving this medicine? Side effects that you should report to your doctor or health care professional as soon as possible: -allergic reactions like skin rash, itching or hives, swelling of the face, lips, or tongue -low blood counts - this medicine may decrease the number of white blood cells, red blood cells and platelets.  You may be at increased risk for infections and bleeding. -signs of infection - fever or chills, cough, sore throat, pain or difficulty passing urine -signs of decreased platelets or bleeding - bruising, pinpoint red spots on the skin, black, tarry stools, blood in the urine -signs of decreased red blood cells - unusually weak or tired, fainting spells, lightheadedness -breathing problems -changes in vision -chest pain -dark urine -diarrhea of more than 4 bowel movements in one day or any diarrhea at  night; bloody or watery diarrhea -dizziness -mouth sores -nausea and vomiting -pain, tingling, numbness in the hands or feet -redness, swelling, or sores on hands or feet -stomach pain -vomiting -yellow color of skin or eyes Side effects that usually do not require medical attention (report to your doctor or health care professional if they continue or are bothersome): -constipation -diarrhea -dry or itchy skin -hair loss -loss of appetite -nausea -weak or tired This list may not describe all possible side effects. Call your doctor for medical advice about side effects. You may report side effects to FDA at 1-800-FDA-1088. Where should I keep my medicine? Keep out of the reach of children. Store at room temperature between 15 and 30 degrees C (59 and 86 degrees F). Keep container tightly closed. Throw away any unused medicine after the expiration date. NOTE: This sheet is a summary. It may not cover all possible information. If you have questions about this medicine, talk to your doctor, pharmacist, or health care provider.    2016, Elsevier/Gold Standard. (2014-07-04 17:03:30)

## 2015-11-16 ENCOUNTER — Ambulatory Visit
Admission: RE | Admit: 2015-11-16 | Discharge: 2015-11-16 | Disposition: A | Discharge status: Home / Self Care | Payer: Medicare Other | Source: Ambulatory Visit | Attending: Radiation Oncology | Admitting: Radiation Oncology

## 2015-11-16 ENCOUNTER — Telehealth: Payer: Self-pay | Admitting: *Deleted

## 2015-11-16 ENCOUNTER — Other Ambulatory Visit: Payer: Self-pay | Admitting: *Deleted

## 2015-11-16 ENCOUNTER — Encounter: Payer: Self-pay | Admitting: Radiation Oncology

## 2015-11-16 VITALS — BP 118/63 | HR 64 | Temp 97.8°F | Resp 18 | Ht 74.0 in | Wt 208.1 lb

## 2015-11-16 DIAGNOSIS — Z87891 Personal history of nicotine dependence: Secondary | ICD-10-CM | POA: Diagnosis not present

## 2015-11-16 DIAGNOSIS — E785 Hyperlipidemia, unspecified: Secondary | ICD-10-CM | POA: Insufficient documentation

## 2015-11-16 DIAGNOSIS — Z79899 Other long term (current) drug therapy: Secondary | ICD-10-CM | POA: Insufficient documentation

## 2015-11-16 DIAGNOSIS — Z8673 Personal history of transient ischemic attack (TIA), and cerebral infarction without residual deficits: Secondary | ICD-10-CM | POA: Insufficient documentation

## 2015-11-16 DIAGNOSIS — C155 Malignant neoplasm of lower third of esophagus: Secondary | ICD-10-CM | POA: Diagnosis not present

## 2015-11-16 DIAGNOSIS — M109 Gout, unspecified: Secondary | ICD-10-CM | POA: Insufficient documentation

## 2015-11-16 DIAGNOSIS — Z510 Encounter for antineoplastic radiation therapy: Secondary | ICD-10-CM | POA: Insufficient documentation

## 2015-11-16 DIAGNOSIS — I771 Stricture of artery: Secondary | ICD-10-CM | POA: Diagnosis not present

## 2015-11-16 DIAGNOSIS — I10 Essential (primary) hypertension: Secondary | ICD-10-CM | POA: Diagnosis not present

## 2015-11-16 DIAGNOSIS — K219 Gastro-esophageal reflux disease without esophagitis: Secondary | ICD-10-CM | POA: Diagnosis not present

## 2015-11-16 DIAGNOSIS — Z800 Family history of malignant neoplasm of digestive organs: Secondary | ICD-10-CM | POA: Insufficient documentation

## 2015-11-16 DIAGNOSIS — R1013 Epigastric pain: Secondary | ICD-10-CM | POA: Insufficient documentation

## 2015-11-16 LAB — CEA: CEA: 5.8 ng/mL — AB (ref 0.0–4.7)

## 2015-11-16 MED ORDER — LANSOPRAZOLE 30 MG PO CPDR: 30.0000 mg | DELAYED_RELEASE_CAPSULE | Freq: Every day | ORAL | Status: DC

## 2015-11-16 NOTE — Telephone Encounter (Signed)
-----   Message from Cammie Sickle, MD sent at 11/16/2015  2:31 PM EDT ----- Please inform patient that his kidney number potassium is slightly off. Recommend increased fluid intake. Hold off mobic if he is taking any; recommend Tylenol instead. Thx

## 2015-11-16 NOTE — Consult Note (Signed)
Except an outstanding is perfect of Radiation Oncology NEW PATIENT EVALUATION  Name: Marcus [name]  MRN: [id number]  Date:   11/16/2015     DOB: [birth year]    REFERRING PHYSICIAN: Chesley Noon, MD  CHIEF COMPLAINT:  Chief Complaint  Patient presents with  . Esophageal Cancer    Initial evaluation of radiation therapy    DIAGNOSIS: The encounter diagnosis was Malignant neoplasm of lower third of esophagus ([place]).   PREVIOUS INVESTIGATIONS:  PET CT scan ordered and will be reviewed Pathology report reviewed Clinical notes including upper endoscopy reviewed  HPI: Patient is a 82 year old male with history of CVA no specific residual motor deficits. He's had several month history of increasing heartburn which eventually led to upper endoscopy. At the time of upper endoscopyThis 82 y.o. male patient presents to the clinic for initial eA medium-sized, fungating, submucosal and ulcerating mass with no bleeding and no stigmata of recent bleeding was found in the distal esophagus, 35 cm from the incisors. The mass was partially obstructing and not circumferential. Runs for about 4 cm. Biopsies were taken with a cold forceps for histology.valuation of distal esophageal adenocarcinoma. Patient has been seen by medical oncology and PET CT scan has been ordered for next week. He continues to take Rolaids for dyspepsia has had no significant weight loss. He states he is swallowing fine. I been asked to evaluate the patient for possible radiation therapy opinion.   PLANNED TREATMENT REGIMEN: ProbableIMRT radiation therapy with concurrent chemotherapy  PAST MEDICAL HISTORY:  has a past medical history of Hypertension; Hyperlipemia; History of stress test (09/02/2009); History of Doppler ultrasound; Subclavian artery stenosis ([place]); Stroke [facility]); Arthritis; Cancer ([place]); Esophageal cancer ([place]); GERD (gastroesophageal reflux disease); Glaucoma; Macular degeneration; Aphakia of right eye; History of  CVA (cerebrovascular accident); Gout; Enlarged prostate without lower urinary tract symptoms ([place]); and History of DVT (deep vein thrombosis).    PAST SURGICAL HISTORY:  Past Surgical History  Procedure Laterality Date  . Cataract extraction    . Fracture surgery    . Mohs surgery    . Esophagogastroduodenoscopy (egd) with propofol N/A 11/10/2015    Procedure: ESOPHAGOGASTRODUODENOSCOPY (EGD) WITH PROPOFOL;  Surgeon: Manya Silvas, MD;  Location: [place];  Service: Endoscopy;  Laterality: N/A;  . Colonoscopy  2002    FAMILY HISTORY: family history includes Cancer in his sister; Cancer - Colon in his sister; Heart disease in his brother; Other in his brother.  SOCIAL HISTORY:  reports that he quit smoking about 22 years ago. His smoking use included Cigarettes. He has a 40 pack-year smoking history. He has never used smokeless tobacco. He reports that he does not drink alcohol or use illicit drugs.  ALLERGIES: Codeine; Allopurinol; Simvastatin; and Uloric  MEDICATIONS:  Current Outpatient Prescriptions  Medication Sig Dispense Refill  . atorvastatin (LIPITOR) 20 MG tablet Take 1 tablet (20 mg total) by mouth daily at 6 PM. 30 tablet 1  . clopidogrel (PLAVIX) 75 MG tablet Take 1 tablet (75 mg total) by mouth daily. 30 tablet 1  . Coenzyme Q10 (COQ10) 100 MG CAPS Take by mouth. Reported on 11/10/2015    . cyanocobalamin 500 MCG tablet Take 500 mcg by mouth daily.    . dorzolamide (TRUSOPT) 2 % ophthalmic solution Place 1 drop into both eyes 2 (two) times daily.    Marland Kitchen doxazosin (CARDURA) 2 MG tablet Take 2 mg by mouth daily.    . finasteride (PROSCAR) 5 MG tablet Take 5 mg by mouth daily.  10  . latanoprost (XALATAN) 0.005 % ophthalmic solution Place 1 drop into both eyes at bedtime.    . magnesium oxide (MAG-OX) 400 MG tablet Take 400 mg by mouth daily.    . meloxicam (MOBIC) 7.5 MG tablet Take 7.5 mg by mouth daily as needed. For pain  5  . metoprolol succinate (TOPROL-XL) 25 MG  24 hr tablet TAKE ONE TABLET (25 MG TOTAL) BY MOUTH DAILY.    Marland Kitchen terazosin (HYTRIN) 10 MG capsule Take 10 mg by mouth at bedtime.    . timolol (TIMOPTIC) 0.5 % ophthalmic solution Place 1 drop into both eyes 2 (two) times daily.  3  . Vitamin D, Ergocalciferol, (DRISDOL) 50000 UNITS CAPS Take 50,000 Units by mouth every 7 (seven) days.    . lansoprazole (PREVACID) 30 MG capsule Take 1 capsule (30 mg total) by mouth daily at 12 noon. 30 capsule 11   No current facility-administered medications for this encounter.    ECOG PERFORMANCE STATUS:  1 - Symptomatic but completely ambulatory  REVIEW OF SYSTEMS: Except for the dyspepsia Patient denies any weight loss, fatigue, weakness, fever, chills or night sweats. Patient denies any loss of vision, blurred vision. Patient denies any ringing  of the ears or hearing loss. No irregular heartbeat. Patient denies heart murmur or history of fainting. Patient denies any chest pain or pain radiating to her upper extremities. Patient denies any shortness of breath, difficulty breathing at night, cough or hemoptysis. Patient denies any swelling in the lower legs. Patient denies any nausea vomiting, vomiting of blood, or coffee ground material in the vomitus. Patient denies any stomach pain. Patient states has had normal bowel movements no significant constipation or diarrhea. Patient denies any dysuria, hematuria or significant nocturia. Patient denies any problems walking, swelling in the joints or loss of balance. Patient denies any skin changes, loss of hair or loss of weight. Patient denies any excessive worrying or anxiety or significant depression. Patient denies any problems with insomnia. Patient denies excessive thirst, polyuria, polydipsia. Patient denies any swollen glands, patient denies easy bruising or easy bleeding. Patient denies any recent infections, allergies or URI. Patient "s visual fields have not changed significantly in recent time.    PHYSICAL  EXAM: BP 118/63 mmHg  Pulse 64  Temp(Src) 97.8 F (36.6 C)  Resp 18  Ht 6\' 2"  (1.88 m)  Wt 208 lb 1.8 oz (94.4 kg)  BMI 26.71 kg/m2 Well-developed well-nourished patient in NAD. HEENT reveals PERLA, EOMI, discs not visualized.  Oral cavity is clear. No oral mucosal lesions are identified. Neck is clear without evidence of cervical or supraclavicular adenopathy. Lungs are clear to A&P. Cardiac examination is essentially unremarkable with regular rate and rhythm without murmur rub or thrill. Abdomen is benign with no organomegaly or masses noted. Motor sensory and DTR levels are equal and symmetric in the upper and lower extremities. Cranial nerves II through XII are grossly intact. Proprioception is intact. No peripheral adenopathy or edema is identified. No motor or sensory levels are noted. Crude visual fields are within normal range.  LABORATORY DATA: Pathology report reviewed    RADIOLOGY RESULTS: CT prior report reviewed of chest. PET CT scan will be evaluated prior to CT simulation   IMPRESSION: Distal esophageal adenocarcinoma partially obstructing in 82 year old male with medical comorbidities including prior CVA.  PLAN: At this time I to evaluate his PET/CT scan for complete staging evaluation. Even if surgery is being considered would still treat preoperatively with chemoradiation. I feel I can  go ahead with a curative dose of radiation therapy and possibly avoid any potential need for surgery. Would plan on delivering 5400 cGy over 5-6 weeks using IM RT radiation therapy. Would chooseIMRT to prevent significant injury to nearby structures including spinal cord heart lung and stomach. Would use a slightly higher than standard dose of radiation therapy based on my experience and extremely safe side effect profile using I M RT treatment planning and delivery.There will be extra effort by both professional staff as well as technical staff to coordinate and manage concurrent chemoradiation  and ensuing side effects during his treatments. Patient wife both comprehend her treatment plan well. Treatment decisions may be altered by PET CT findings. Will make available to patient prior to proceeding with CT simulation.  I would like to take this opportunity to thank you for allowing me to participate in the care of your patient.Armstead Peaks., MD

## 2015-11-16 NOTE — Progress Notes (Signed)
Written and verbal information given regarding side effects, lab appointments and hygiene.  Patient and wife verbalized understanding of all information; questions answered to their satisfaction.  15 minutes spent educating patient and wife

## 2015-11-16 NOTE — Telephone Encounter (Signed)
Called patient to inform him that his kidney function is slightly off.  MD recommends he increase his fluid intake and to hold off on taking Mobic. Should take tylenol instead.  Verbalized understanding.

## 2015-11-17 ENCOUNTER — Telehealth: Payer: Self-pay

## 2015-11-17 NOTE — Telephone Encounter (Signed)
  Oncology Nurse Navigator Documentation  Navigator Location: [facility] (11/17/15 1000) Navigator Encounter Type: Telephone;Education (11/17/15 1000) Telephone: Education (11/17/15 1000)                                        Time Spent with Patient: 30 (11/17/15 1000)   After educating further about oral vs. IV chemo, they were interested in finding out which route was cheaper. I was unable to obtain this information but per Dr Rogue Bussing due to creatinine level he will not be able to receive oral Xeloda.

## 2015-11-22 ENCOUNTER — Encounter
Admission: RE | Admit: 2015-11-22 | Discharge: 2015-11-22 | Disposition: A | Discharge status: Home / Self Care | Payer: Medicare Other | Source: Ambulatory Visit | Attending: Internal Medicine | Admitting: Internal Medicine

## 2015-11-22 DIAGNOSIS — C155 Malignant neoplasm of lower third of esophagus: Secondary | ICD-10-CM | POA: Diagnosis present

## 2015-11-22 LAB — GLUCOSE, CAPILLARY: Glucose-Capillary: 113 mg/dL — ABNORMAL HIGH (ref 65–99)

## 2015-11-22 MED ORDER — FLUDEOXYGLUCOSE F - 18 (FDG) INJECTION
12.4500 | Freq: Once | INTRAVENOUS | Status: AC | PRN
  Administered 2015-11-22: 12.45 via INTRAVENOUS

## 2015-11-23 ENCOUNTER — Telehealth: Payer: Self-pay | Admitting: Neurology

## 2015-11-23 ENCOUNTER — Ambulatory Visit
Admission: RE | Admit: 2015-11-23 | Discharge: 2015-11-23 | Disposition: A | Discharge status: Home / Self Care | Payer: Medicare Other | Source: Ambulatory Visit | Attending: Radiation Oncology | Admitting: Radiation Oncology

## 2015-11-23 DIAGNOSIS — C155 Malignant neoplasm of lower third of esophagus: Secondary | ICD-10-CM | POA: Diagnosis not present

## 2015-11-23 NOTE — Telephone Encounter (Signed)
Message will be sent to Dr. Leonie Man on Monday once he returns from vacation.

## 2015-11-23 NOTE — Telephone Encounter (Signed)
FYI- I called this patient to schedule the Carotid Doppler that was ordered.  Pt stated that he is going through chemotherapy right now and doesn't want to have another test.  He stated that he had one last July and didn't want to do another at this time.  Also advised that he may be calling back to cx the follow-up in September with Hoyle Sauer.

## 2015-11-24 ENCOUNTER — Inpatient Hospital Stay (HOSPITAL_BASED_OUTPATIENT_CLINIC_OR_DEPARTMENT_OTHER): Payer: Medicare Other | Admitting: Internal Medicine

## 2015-11-24 VITALS — BP 176/75 | HR 52 | Temp 97.9°F | Resp 18 | Wt 210.5 lb

## 2015-11-24 DIAGNOSIS — C155 Malignant neoplasm of lower third of esophagus: Secondary | ICD-10-CM

## 2015-11-24 DIAGNOSIS — Z79899 Other long term (current) drug therapy: Secondary | ICD-10-CM

## 2015-11-24 DIAGNOSIS — I129 Hypertensive chronic kidney disease with stage 1 through stage 4 chronic kidney disease, or unspecified chronic kidney disease: Secondary | ICD-10-CM

## 2015-11-24 DIAGNOSIS — N183 Chronic kidney disease, stage 3 (moderate): Secondary | ICD-10-CM | POA: Diagnosis not present

## 2015-11-24 DIAGNOSIS — D631 Anemia in chronic kidney disease: Secondary | ICD-10-CM

## 2015-11-24 MED ORDER — PROCHLORPERAZINE MALEATE 10 MG PO TABS: 10.0000 mg | ORAL_TABLET | Freq: Four times a day (QID) | ORAL | Status: DC | PRN

## 2015-11-24 MED ORDER — ONDANSETRON HCL 8 MG PO TABS: 8.0000 mg | ORAL_TABLET | Freq: Three times a day (TID) | ORAL | Status: DC | PRN

## 2015-11-24 MED ORDER — LIDOCAINE-PRILOCAINE 2.5-2.5 % EX CREA: 1.0000 "application " | TOPICAL_CREAM | CUTANEOUS | Status: DC | PRN

## 2015-11-24 NOTE — Progress Notes (Signed)
.[name] NOTE  Patient Care Team: Chesley Noon, MD as PCP - General (Family Medicine)  CHIEF COMPLAINTS/PURPOSE OF CONSULTATION:  Oncology History   # June 2017- Distal Esophagus Adeno CA [Bx Dr.Elliot]; PET- no distant Mets; July 5th 2017-concurrent Carbo-Taxol- RT   # CKD [creat 1.4-1.7]  MOLECULAR STUDIES- Her 2 Pending     Esophageal cancer ([place])   11/10/2015 Initial Diagnosis Esophageal cancer ([facility])- distal esophagus- non-circumferential partially obstructing ulcerating mass- biopsy positive for mod diff  adenocarcinoma [Dr.Elliot]     HISTORY OF PRESENTING ILLNESS:  Marcus [name] 82 y.o.  male With new diagnosis of adenocarcinoma of the esophagus is here to review the PET scan/further treatment options.  Patient continues to complain of intermittent heartburn for which he is on PPI.  Patient denies any weight loss. Denies any difficulty swallowing food or liquids. No nausea no vomiting. No chest pain or shortness of breath or cough. No fevers or chills.   He continues to walk with a cane given history of stroke.   ROS: A complete 10 point review of system is done which is negative except mentioned above in history of present illness  MEDICAL HISTORY:  Past Medical History  Diagnosis Date  . Hypertension   . Hyperlipemia   . History of stress test 09/02/2009    abnormal myocardial perfusion scan demonstrating an attenuation defect in the inferior region of the myocardium. No ischemia or infarct/scar is seen in the remaining myocardium. No prior study available for comparison. Abnormal myocardial study EF 79%  . History of Doppler ultrasound     dopplers revealed mild left ICA stenosis which has remained stable, occluded left subclavian artery with retrograde left vertebral filling and moderately severe right subclavian artery stenosis. he has no symptoms of upper extermity claudication or subclavian steal symptoms.   . Subclavian artery  stenosis ([place])   . Stroke ([place])   . Arthritis   . Cancer ([place])     SKIN  . Esophageal cancer ([place])   . GERD (gastroesophageal reflux disease)   . Glaucoma   . Macular degeneration   . Aphakia of right eye   . History of CVA (cerebrovascular accident)     Cerebral infarction Nov. 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 Gout   . Enlarged prostate without lower urinary tract symptoms (luts)   . History of DVT (deep vein thrombosis)     right arm    SURGICAL HISTORY: Past Surgical History  Procedure Laterality Date  . Cataract extraction    . Fracture surgery    . Mohs surgery    . Esophagogastroduodenoscopy (egd) with propofol N/A 11/10/2015    Procedure: ESOPHAGOGASTRODUODENOSCOPY (EGD) WITH PROPOFOL;  Surgeon: Manya Silvas, MD;  Location: [facility];  Service: Endoscopy;  Laterality: N/A;  . Colonoscopy  2002    SOCIAL HISTORY: [place]; worked in office.  Social History   Social History  . Marital Status: Married    Spouse Name: N/A  . Number of Children: N/A  . Years of Education: N/A   Occupational History  . Not on file.   Social History Main Topics  . Smoking status: Former Smoker -- 1.00 packs/day for 40 years    Types: Cigarettes    Quit date: 06/03/1993  . Smokeless tobacco: Never Used  . Alcohol Use: No  . Drug Use: No  . Sexual Activity: Not on file   Other Topics Concern  . Not on file  Social History Narrative    FAMILY HISTORY: Family History  Problem Relation Age of Onset  . Heart disease Brother   . Cancer - Colon Sister   . Cancer Sister   . Other Brother     CABG    ALLERGIES:  is allergic to codeine; allopurinol; simvastatin; and uloric.  MEDICATIONS:  Current Outpatient Prescriptions  Medication Sig Dispense Refill  . atorvastatin (LIPITOR) 20 MG tablet Take 1 tablet (20 mg total) by mouth daily at 6 PM. 30 tablet 1  . clopidogrel (PLAVIX) 75 MG tablet Take 1 tablet (75 mg total) by mouth daily. 30 tablet 1  .  Coenzyme Q10 (COQ10) 100 MG CAPS Take by mouth. Reported on 11/10/2015    . cyanocobalamin 500 MCG tablet Take 500 mcg by mouth daily.    . dorzolamide (TRUSOPT) 2 % ophthalmic solution Place 1 drop into both eyes 2 (two) times daily.    Marland Kitchen doxazosin (CARDURA) 2 MG tablet Take 2 mg by mouth daily.    . finasteride (PROSCAR) 5 MG tablet Take 5 mg by mouth daily.  10  . lansoprazole (PREVACID) 30 MG capsule Take 1 capsule (30 mg total) by mouth daily at 12 noon. 30 capsule 11  . latanoprost (XALATAN) 0.005 % ophthalmic solution Place 1 drop into both eyes at bedtime.    . magnesium oxide (MAG-OX) 400 MG tablet Take 400 mg by mouth daily.    . meloxicam (MOBIC) 7.5 MG tablet Take 7.5 mg by mouth daily as needed. For pain  5  . metoprolol succinate (TOPROL-XL) 25 MG 24 hr tablet TAKE ONE TABLET (25 MG TOTAL) BY MOUTH DAILY.    Marland Kitchen terazosin (HYTRIN) 10 MG capsule Take 10 mg by mouth at bedtime.    . timolol (TIMOPTIC) 0.5 % ophthalmic solution Place 1 drop into both eyes 2 (two) times daily.  3  . Vitamin D, Ergocalciferol, (DRISDOL) 50000 UNITS CAPS Take 50,000 Units by mouth every 7 (seven) days.    Marland Kitchen lidocaine-prilocaine (EMLA) cream Apply 1 application topically as needed. Apply generously over the Mediport 45 minutes prior to chemotherapy. 30 g 0  . ondansetron (ZOFRAN) 8 MG tablet Take 1 tablet (8 mg total) by mouth every 8 (eight) hours as needed for nausea or vomiting (start 3 days; after chemo). 40 tablet 1  . prochlorperazine (COMPAZINE) 10 MG tablet Take 1 tablet (10 mg total) by mouth every 6 (six) hours as needed for nausea or vomiting. 40 tablet 1  . RA LANSOPRAZOLE 15 MG capsule take 2 capsules by mouth once daily AT 12 NOON  1   No current facility-administered medications for this visit.      Marland Kitchen  PHYSICAL EXAMINATION: ECOG PERFORMANCE STATUS: 1 - Symptomatic but completely ambulatory  Filed Vitals:   11/24/15 1421  BP: 176/75  Pulse: 52  Temp: 97.9 F (36.6 C)  Resp: 18    Filed Weights   11/24/15 1421  Weight: 210 lb 8.6 oz (95.5 kg)    GENERAL: Well-nourished well-developed; Alert, no distress and comfortable.   Accompanied by his wife /daughter  EYES: no pallor or icterus OROPHARYNX: no thrush or ulceration NECK: supple, no masses felt LYMPH:  no palpable lymphadenopathy in the cervical, axillary or inguinal regions LUNGS: clear to auscultation and  No wheeze or crackles HEART/CVS: regular rate & rhythm and no murmurs; No lower extremity edema ABDOMEN: abdomen soft, non-tender and normal bowel sounds Musculoskeletal:no cyanosis of digits and no clubbing  PSYCH: alert & oriented x  3 with fluent speech NEURO: no focal motor/sensory deficits SKIN:  no rashes or significant lesions  LABORATORY DATA:  I have reviewed the data as listed Lab Results  Component Value Date   WBC 5.6 11/15/2015   HGB 13.7 11/15/2015   HCT 39.5* 11/15/2015   MCV 99.5 11/15/2015   PLT 172 11/15/2015    Recent Labs  12/25/14 0750 12/26/14 0831 11/15/15 1250  NA 137 137 137  K 5.2* 4.5 5.3*  CL 109 108 106  CO2 20* 21* 23  GLUCOSE 107* 148* 104*  BUN 29* 30* 33*  CREATININE 1.34* 1.40* 1.74*  CALCIUM 9.3 9.0 8.8*  GFRNONAA 48* 46* 35*  GFRAA 56* 53* 40*  PROT 6.9  --  8.4*  ALBUMIN 3.3*  --  4.2  AST 18  --  19  ALT 12*  --  15*  ALKPHOS 82  --  98  BILITOT 0.5  --  0.8    RADIOGRAPHIC STUDIES: I have personally reviewed the radiological images as listed and agreed with the findings in the report. Nm Pet Image Initial (pi) Skull Base To Thigh  11/22/2015  CLINICAL DATA:  Initial treatment strategy for esophageal carcinoma. EXAM: NUCLEAR MEDICINE PET SKULL BASE TO THIGH TECHNIQUE: 12.45 mCi F-18 FDG was injected intravenously. Full-ring PET imaging was performed from the skull base to thigh after the radiotracer. CT data was obtained and used for attenuation correction and anatomic localization. FASTING BLOOD GLUCOSE:  Value: 113 mg/dl COMPARISON:  None.  FINDINGS: NECK No hypermetabolic lymph nodes in the neck. CHEST No hypermetabolic mediastinal or hilar nodes. No suspicious pulmonary nodules on the CT scan. Malignant range FDG uptake is associated with the distal esophageal mass. This mass measures approximately 3.4 x 3.0 x 4.5 cm and has an SUV max equal to 8.64. Indeterminate nodular density within the left lower lobe measures 2.3 x 0.9 cm and has an SUV max equal to 1.49. ABDOMEN/PELVIS No abnormal hypermetabolic activity within the liver, pancreas, adrenal glands, or spleen. No hypermetabolic lymph nodes in the abdomen or pelvis. SKELETON No focal hypermetabolic activity to suggest skeletal metastasis. There is a focal area of increased uptake localizing to the left 12th costovertebral junction. There are corresponding degenerative changes on the CT images, image 150 of series 3. IMPRESSION: 1. Intense FDG uptake is associated with the distal esophageal mass compatible with primary esophageal carcinoma. 2. No hypermetabolic mediastinal or upper abdominal adenopathy. 3. There is a indeterminate nodular density within the left lower lobe which exhibits borderline FDG uptake. This may be inflammatory or infectious. Metastatic disease less favored. Recommend three-month follow-up examination with repeat CT of the chest. 4. Increased uptake localizes to the left twelfth costovertebral junction. No corresponding suspicious bone lesion. This is favored to represent arthropathic change. Electronically Signed   By: Kerby Moors M.D.   On: 11/22/2015 11:31    ASSESSMENT & PLAN:  Esophageal cancer ([place]) Based on the PET scan patient likely has- localized esophageal cancer; no evidence of any local adenopathy. Given the age/comorbidities patient not a candidate for surgical options.  I would recommend concurrent chemoradiation therapy- I would recommend carbotaxol along with radiation for 6-8 weeks; based upon his tolerance I might recommend 5-FU-based therapy  for 3-4 cycles.   Discussed the potential side effects including but not limited to-increasing fatigue, nausea vomiting, diarrhea, hair loss, sores in the mouth, increase risk of infection and also neuropathy.    # Chemotherapy education; port placement. Hopefully the planned start chemotherapy next  week. Antiemetics-Zofran and Compazine; EMLA cream sent to pharmacy.  Patient plans to start radiation the week of July 5th/ I plan to see the patient on the time; we will recheck the labs.  # the above plan of care was discussed with the patient and his wife/daughter in detail.  I reviewed the images myself and with the patient and family in detail.      40 minutes face-to-face with the patient discussing the above plan of care; more than 50% of time spent on prognosis/ natural history; counseling and coordination.  CC: Dr.Badger; Lauralee Evener in [place].Cammie Sickle, MD 11/24/2015 5:50 PM

## 2015-11-24 NOTE — Assessment & Plan Note (Addendum)
Based on the PET scan patient likely has- localized esophageal cancer; no evidence of any local adenopathy. Given the age/comorbidities patient not a candidate for surgical options.  I would recommend concurrent chemoradiation therapy- I would recommend carbotaxol along with radiation for 6-8 weeks; based upon his tolerance I might recommend 5-FU-based therapy for 3-4 cycles.   Discussed the potential side effects including but not limited to-increasing fatigue, nausea vomiting, diarrhea, hair loss, sores in the mouth, increase risk of infection and also neuropathy.    # Chemotherapy education; port placement. Hopefully the planned start chemotherapy next week. Antiemetics-Zofran and Compazine; EMLA cream sent to pharmacy.  Patient plans to start radiation the week of July 5th/ I plan to see the patient on the time; we will recheck the labs.  # the above plan of care was discussed with the patient and his wife/daughter in detail.  I reviewed the images myself and with the patient and family in detail.

## 2015-11-24 NOTE — Progress Notes (Signed)
Patient continues to have heartburn off and on.  Takes Tylenol and it improves.

## 2015-11-24 NOTE — Patient Instructions (Signed)
Your port a cath will be scheduled on Wednesday, June 28 by Dr. Lucky Cowboy or Dr. Delana Meyer.  You will need to arrive at 10am in the medical mall on this day.  You may not eat or drink anything 8 hrs prior to this procedure.  You will need to bring a driver to this appointment.  You may take your routine blood pressure medications only with a sip of water the morning of the procedure. You do not need to stop her plavix.

## 2015-11-27 LAB — SURGICAL PATHOLOGY

## 2015-11-28 ENCOUNTER — Other Ambulatory Visit: Payer: Self-pay | Admitting: Vascular Surgery

## 2015-11-28 ENCOUNTER — Other Ambulatory Visit: Payer: Self-pay | Admitting: Internal Medicine

## 2015-11-28 NOTE — Telephone Encounter (Signed)
ok 

## 2015-11-28 NOTE — Patient Instructions (Signed)

## 2015-11-29 ENCOUNTER — Encounter
Admission: RE | Disposition: A | Discharge status: Home / Self Care | Payer: Self-pay | Source: Ambulatory Visit | Attending: Vascular Surgery

## 2015-11-29 ENCOUNTER — Telehealth: Payer: Self-pay

## 2015-11-29 ENCOUNTER — Telehealth: Payer: Self-pay | Admitting: *Deleted

## 2015-11-29 ENCOUNTER — Encounter: Payer: Self-pay | Admitting: *Deleted

## 2015-11-29 ENCOUNTER — Ambulatory Visit
Admission: RE | Admit: 2015-11-29 | Discharge: 2015-11-29 | Disposition: A | Discharge status: Home / Self Care | Payer: Medicare Other | Source: Ambulatory Visit | Attending: Vascular Surgery | Admitting: Vascular Surgery

## 2015-11-29 DIAGNOSIS — Z888 Allergy status to other drugs, medicaments and biological substances status: Secondary | ICD-10-CM | POA: Diagnosis not present

## 2015-11-29 DIAGNOSIS — C159 Malignant neoplasm of esophagus, unspecified: Secondary | ICD-10-CM | POA: Insufficient documentation

## 2015-11-29 DIAGNOSIS — Z79899 Other long term (current) drug therapy: Secondary | ICD-10-CM | POA: Diagnosis not present

## 2015-11-29 DIAGNOSIS — N400 Enlarged prostate without lower urinary tract symptoms: Secondary | ICD-10-CM | POA: Insufficient documentation

## 2015-11-29 DIAGNOSIS — H3530 Unspecified macular degeneration: Secondary | ICD-10-CM | POA: Insufficient documentation

## 2015-11-29 DIAGNOSIS — E785 Hyperlipidemia, unspecified: Secondary | ICD-10-CM | POA: Diagnosis not present

## 2015-11-29 DIAGNOSIS — Z8249 Family history of ischemic heart disease and other diseases of the circulatory system: Secondary | ICD-10-CM | POA: Insufficient documentation

## 2015-11-29 DIAGNOSIS — Z9849 Cataract extraction status, unspecified eye: Secondary | ICD-10-CM | POA: Diagnosis not present

## 2015-11-29 DIAGNOSIS — C155 Malignant neoplasm of lower third of esophagus: Secondary | ICD-10-CM | POA: Diagnosis not present

## 2015-11-29 DIAGNOSIS — I708 Atherosclerosis of other arteries: Secondary | ICD-10-CM | POA: Insufficient documentation

## 2015-11-29 DIAGNOSIS — Z800 Family history of malignant neoplasm of digestive organs: Secondary | ICD-10-CM | POA: Diagnosis not present

## 2015-11-29 DIAGNOSIS — M109 Gout, unspecified: Secondary | ICD-10-CM | POA: Insufficient documentation

## 2015-11-29 DIAGNOSIS — Z885 Allergy status to narcotic agent status: Secondary | ICD-10-CM | POA: Insufficient documentation

## 2015-11-29 DIAGNOSIS — H2701 Aphakia, right eye: Secondary | ICD-10-CM | POA: Insufficient documentation

## 2015-11-29 DIAGNOSIS — H269 Unspecified cataract: Secondary | ICD-10-CM | POA: Insufficient documentation

## 2015-11-29 DIAGNOSIS — Z86718 Personal history of other venous thrombosis and embolism: Secondary | ICD-10-CM | POA: Insufficient documentation

## 2015-11-29 DIAGNOSIS — M1990 Unspecified osteoarthritis, unspecified site: Secondary | ICD-10-CM | POA: Insufficient documentation

## 2015-11-29 DIAGNOSIS — I10 Essential (primary) hypertension: Secondary | ICD-10-CM | POA: Diagnosis not present

## 2015-11-29 DIAGNOSIS — Z87891 Personal history of nicotine dependence: Secondary | ICD-10-CM | POA: Diagnosis not present

## 2015-11-29 DIAGNOSIS — K219 Gastro-esophageal reflux disease without esophagitis: Secondary | ICD-10-CM | POA: Insufficient documentation

## 2015-11-29 DIAGNOSIS — Z8673 Personal history of transient ischemic attack (TIA), and cerebral infarction without residual deficits: Secondary | ICD-10-CM | POA: Diagnosis not present

## 2015-11-29 DIAGNOSIS — Z85828 Personal history of other malignant neoplasm of skin: Secondary | ICD-10-CM | POA: Insufficient documentation

## 2015-11-29 HISTORY — PX: PERIPHERAL VASCULAR CATHETERIZATION: SHX172C

## 2015-11-29 SURGERY — PORTA CATH INSERTION
Anesthesia: Moderate Sedation

## 2015-11-29 MED ORDER — HYDROMORPHONE HCL 1 MG/ML IJ SOLN: 1.0000 mg | Freq: Once | INTRAMUSCULAR | Status: DC

## 2015-11-29 MED ORDER — SODIUM CHLORIDE 0.9 % IV SOLN
INTRAVENOUS | Status: DC
  Administered 2015-11-29: 10:00:00 via INTRAVENOUS

## 2015-11-29 MED ORDER — DEXTROSE 5 % IV SOLN
1.5000 g | INTRAVENOUS | Status: AC
  Administered 2015-11-29: 1.5 g via INTRAVENOUS

## 2015-11-29 MED ORDER — SODIUM CHLORIDE 0.9 % IR SOLN
Freq: Once | Status: DC
  Filled 2015-11-29 (×2): qty 2

## 2015-11-29 MED ORDER — MIDAZOLAM HCL 2 MG/2ML IJ SOLN
INTRAMUSCULAR | Status: DC | PRN
  Administered 2015-11-29: 2 mg via INTRAVENOUS

## 2015-11-29 MED ORDER — FENTANYL CITRATE (PF) 100 MCG/2ML IJ SOLN
INTRAMUSCULAR | Status: DC | PRN
  Administered 2015-11-29: 50 ug via INTRAVENOUS

## 2015-11-29 MED ORDER — ONDANSETRON HCL 4 MG/2ML IJ SOLN: 4.0000 mg | Freq: Four times a day (QID) | INTRAMUSCULAR | Status: DC | PRN

## 2015-11-29 MED ORDER — MIDAZOLAM HCL 5 MG/5ML IJ SOLN
INTRAMUSCULAR | Status: AC
  Filled 2015-11-29: qty 5

## 2015-11-29 MED ORDER — LIDOCAINE-EPINEPHRINE (PF) 1 %-1:200000 IJ SOLN
INTRAMUSCULAR | Status: AC
  Filled 2015-11-29: qty 30

## 2015-11-29 MED ORDER — FENTANYL CITRATE (PF) 100 MCG/2ML IJ SOLN
INTRAMUSCULAR | Status: AC
Start: 2015-11-29 — End: 2015-11-29
  Filled 2015-11-29: qty 2

## 2015-11-29 SURGICAL SUPPLY — 10 items
BAG DECANTER STRL (MISCELLANEOUS) ×3 IMPLANT
KIT PORT POWER 8FR ISP CVUE (Catheter) ×2 IMPLANT
PACK ANGIOGRAPHY (CUSTOM PROCEDURE TRAY) ×3 IMPLANT
PAD GROUND ADULT SPLIT (MISCELLANEOUS) ×3 IMPLANT
PENCIL ELECTRO HAND CTR (MISCELLANEOUS) ×3 IMPLANT
PREP CHG 10.5 TEAL (MISCELLANEOUS) ×3 IMPLANT
SUT MNCRL AB 4-0 PS2 18 (SUTURE) ×3 IMPLANT
SUT PROLENE 0 CT 1 30 (SUTURE) ×3 IMPLANT
SUTURE VIC 3-0 (SUTURE) ×3 IMPLANT
TOWEL OR 17X26 4PK STRL BLUE (TOWEL DISPOSABLE) ×3 IMPLANT

## 2015-11-29 NOTE — Progress Notes (Signed)
RN initated prior auth for zofran with [employer] via covermeds. Pending insurance approval.

## 2015-11-29 NOTE — Telephone Encounter (Signed)
Called [facility] to inform them PA for ondansetron has been approved.  Asked them to run it through again.  Pharmacist stated it went through.  Called patient and LVM that his medication will be available for p/u later today.

## 2015-11-29 NOTE — Telephone Encounter (Signed)
  Oncology Nurse Navigator Documentation  Navigator Location: [facility] (11/29/15 0800) Navigator Encounter Type: Telephone (11/29/15 0800) Telephone: Incoming Call;Education (11/29/15 0800)                                        Time Spent with Patient: 15 (11/29/15 0800)   Received call from patient. Asking if he needs to apply Emla cream today before he has port a cath inserted. Educated that cream does not need to be applied today. It is applied one hour before scheduled chemotherapy appointment to numb skin prior to accessing port with needle. Readback performed.

## 2015-11-29 NOTE — Op Note (Signed)
      [facility]       Operative Note  Date: 11/29/2015  Preoperative diagnosis:  1. Esophageal cancer  Postoperative diagnosis:  Same as above  Procedures: #1. Ultrasound guidance for vascular access to the right internal jugular vein. #2. Fluoroscopic guidance for placement of catheter. #3. Placement of CT compatible Port-A-Cath, right internal jugular vein.  Surgeon: Leotis Pain, MD.   Anesthesia: Local with moderate conscious sedation for approximately 15  minutes using 2 mg of Versed and 50 mcg of Fentanyl  Fluoroscopy time: less than 1 minute  Contrast used: 0  Estimated blood loss: Minimal  Indication for the procedure:  The patient is a 82 y.o.male with esophageal cancer.  The patient needs a Port-A-Cath for durable venous access, chemotherapy, lab draws, and CT scans. We are asked to place this. Risks and benefits were discussed and informed consent was obtained.  Description of procedure: The patient was brought to the vascular and interventional radiology suite.  Moderate conscious sedation was administered throughout the procedure during a face to face encounter with the patient with my supervision of the RN administering medicines and monitoring the patient's vital signs, pulse oximetry, telemetry and mental status throughout from the start of the procedure until the patient was taken to the recovery room. The right neck chest and shoulder were sterilely prepped and draped, and a sterile surgical field was created. Ultrasound was used to help visualize a patent right internal jugular vein. This was then accessed under direct ultrasound guidance without difficulty with the Seldinger needle and a permanent image was recorded. A J-wire was placed. After skin nick and dilatation, the peel-away sheath was then placed over the wire. I then anesthetized an area under the clavicle approximately 1-2 fingerbreadths. A transverse incision was created and an inferior  pocket was created with electrocautery and blunt dissection. The port was then brought onto the field, placed into the pocket and secured to the chest wall with 2 Prolene sutures. The catheter was connected to the port and tunneled from the subclavicular incision to the access site. Fluoroscopic guidance was then used to cut the catheter to an appropriate length. The catheter was then placed through the peel-away sheath and the peel-away sheath was removed. The catheter tip was parked in excellent location under fluorocoscopic guidance in the cavoatrial junction area. The pocket was then irrigated with antibiotic impregnated saline and the wound was closed with a running 3-0 Vicryl and a 4-0 Monocryl. The access incision was closed with a single 4-0 Monocryl. The Huber needle was used to withdraw blood and flush the port with heparinized saline. Dermabond was then placed as a dressing. The patient tolerated the procedure well and was taken to the recovery room in stable condition.   Stanislaus Kaltenbach 11/29/2015 11:47 AM

## 2015-11-29 NOTE — H&P (Signed)
  [facility] History & Physical Update  The patient was interviewed and re-examined.  The patient's previous History and Physical has been reviewed and is unchanged.  There is no change in the plan of care. We plan to proceed with the scheduled procedure.  Dell Hurtubise, MD  11/29/2015, 10:51 AM

## 2015-11-29 NOTE — Discharge Instructions (Signed)

## 2015-11-30 ENCOUNTER — Encounter: Payer: Self-pay | Admitting: Internal Medicine

## 2015-11-30 ENCOUNTER — Encounter (INDEPENDENT_AMBULATORY_CARE_PROVIDER_SITE_OTHER): Payer: Self-pay

## 2015-11-30 ENCOUNTER — Inpatient Hospital Stay: Payer: Medicare Other

## 2015-11-30 ENCOUNTER — Encounter: Payer: Self-pay | Admitting: Vascular Surgery

## 2015-11-30 DIAGNOSIS — C155 Malignant neoplasm of lower third of esophagus: Secondary | ICD-10-CM | POA: Diagnosis not present

## 2015-12-01 DIAGNOSIS — C155 Malignant neoplasm of lower third of esophagus: Secondary | ICD-10-CM | POA: Diagnosis not present

## 2015-12-04 ENCOUNTER — Inpatient Hospital Stay: Payer: Medicare Other | Attending: Internal Medicine

## 2015-12-04 ENCOUNTER — Inpatient Hospital Stay (HOSPITAL_BASED_OUTPATIENT_CLINIC_OR_DEPARTMENT_OTHER): Payer: Medicare Other | Admitting: Internal Medicine

## 2015-12-04 ENCOUNTER — Ambulatory Visit: Payer: Medicare Other

## 2015-12-04 ENCOUNTER — Ambulatory Visit
Admission: RE | Admit: 2015-12-04 | Discharge: 2015-12-04 | Disposition: A | Discharge status: Home / Self Care | Payer: Medicare Other | Source: Ambulatory Visit | Attending: Radiation Oncology | Admitting: Radiation Oncology

## 2015-12-04 ENCOUNTER — Inpatient Hospital Stay: Payer: Medicare Other

## 2015-12-04 ENCOUNTER — Ambulatory Visit: Admission: RE | Admit: 2015-12-04 | Payer: Medicare Other | Source: Ambulatory Visit

## 2015-12-04 VITALS — BP 178/68 | HR 68

## 2015-12-04 VITALS — BP 159/72 | HR 65 | Temp 97.3°F | Resp 18 | Wt 210.8 lb

## 2015-12-04 DIAGNOSIS — Z86718 Personal history of other venous thrombosis and embolism: Secondary | ICD-10-CM | POA: Insufficient documentation

## 2015-12-04 DIAGNOSIS — I129 Hypertensive chronic kidney disease with stage 1 through stage 4 chronic kidney disease, or unspecified chronic kidney disease: Secondary | ICD-10-CM | POA: Insufficient documentation

## 2015-12-04 DIAGNOSIS — M1990 Unspecified osteoarthritis, unspecified site: Secondary | ICD-10-CM | POA: Diagnosis not present

## 2015-12-04 DIAGNOSIS — H3530 Unspecified macular degeneration: Secondary | ICD-10-CM | POA: Diagnosis not present

## 2015-12-04 DIAGNOSIS — Z5111 Encounter for antineoplastic chemotherapy: Secondary | ICD-10-CM | POA: Diagnosis not present

## 2015-12-04 DIAGNOSIS — Z87891 Personal history of nicotine dependence: Secondary | ICD-10-CM | POA: Diagnosis not present

## 2015-12-04 DIAGNOSIS — N400 Enlarged prostate without lower urinary tract symptoms: Secondary | ICD-10-CM | POA: Diagnosis not present

## 2015-12-04 DIAGNOSIS — H409 Unspecified glaucoma: Secondary | ICD-10-CM | POA: Insufficient documentation

## 2015-12-04 DIAGNOSIS — M109 Gout, unspecified: Secondary | ICD-10-CM | POA: Insufficient documentation

## 2015-12-04 DIAGNOSIS — N189 Chronic kidney disease, unspecified: Secondary | ICD-10-CM | POA: Insufficient documentation

## 2015-12-04 DIAGNOSIS — E785 Hyperlipidemia, unspecified: Secondary | ICD-10-CM | POA: Diagnosis not present

## 2015-12-04 DIAGNOSIS — Z79899 Other long term (current) drug therapy: Secondary | ICD-10-CM | POA: Insufficient documentation

## 2015-12-04 DIAGNOSIS — R944 Abnormal results of kidney function studies: Secondary | ICD-10-CM | POA: Insufficient documentation

## 2015-12-04 DIAGNOSIS — Z8673 Personal history of transient ischemic attack (TIA), and cerebral infarction without residual deficits: Secondary | ICD-10-CM | POA: Diagnosis not present

## 2015-12-04 DIAGNOSIS — C155 Malignant neoplasm of lower third of esophagus: Secondary | ICD-10-CM | POA: Insufficient documentation

## 2015-12-04 DIAGNOSIS — K219 Gastro-esophageal reflux disease without esophagitis: Secondary | ICD-10-CM | POA: Diagnosis not present

## 2015-12-04 DIAGNOSIS — C159 Malignant neoplasm of esophagus, unspecified: Secondary | ICD-10-CM

## 2015-12-04 LAB — COMPREHENSIVE METABOLIC PANEL
ALK PHOS: 84 U/L (ref 38–126)
ALT: 10 U/L — AB (ref 17–63)
AST: 18 U/L (ref 15–41)
Albumin: 3.6 g/dL (ref 3.5–5.0)
Anion gap: 4 — ABNORMAL LOW (ref 5–15)
BUN: 32 mg/dL — AB (ref 6–20)
CHLORIDE: 111 mmol/L (ref 101–111)
CO2: 23 mmol/L (ref 22–32)
CREATININE: 1.48 mg/dL — AB (ref 0.61–1.24)
Calcium: 8.5 mg/dL — ABNORMAL LOW (ref 8.9–10.3)
GFR calc Af Amer: 49 mL/min — ABNORMAL LOW (ref >60)
GFR, EST NON AFRICAN AMERICAN: 42 mL/min — AB (ref >60)
Glucose, Bld: 178 mg/dL — ABNORMAL HIGH (ref 65–99)
Potassium: 4.5 mmol/L (ref 3.5–5.1)
Sodium: 138 mmol/L (ref 135–145)
Total Bilirubin: 0.5 mg/dL (ref 0.3–1.2)
Total Protein: 7.1 g/dL (ref 6.5–8.1)

## 2015-12-04 LAB — CBC WITH DIFFERENTIAL/PLATELET
Basophils Absolute: 0 10*3/uL (ref 0–0.1)
Basophils Relative: 1 %
EOS ABS: 0.1 10*3/uL (ref 0–0.7)
EOS PCT: 3 %
HCT: 34.5 % — ABNORMAL LOW (ref 40.0–52.0)
Hemoglobin: 12 g/dL — ABNORMAL LOW (ref 13.0–18.0)
LYMPHS ABS: 0.7 10*3/uL — AB (ref 1.0–3.6)
Lymphocytes Relative: 14 %
MCH: 34.5 pg — AB (ref 26.0–34.0)
MCHC: 34.8 g/dL (ref 32.0–36.0)
MCV: 99.2 fL (ref 80.0–100.0)
MONOS PCT: 8 %
Monocytes Absolute: 0.4 10*3/uL (ref 0.2–1.0)
Neutro Abs: 3.9 10*3/uL (ref 1.4–6.5)
Neutrophils Relative %: 74 %
PLATELETS: 151 10*3/uL (ref 150–440)
RBC: 3.48 MIL/uL — AB (ref 4.40–5.90)
RDW: 13.6 % (ref 11.5–14.5)
WBC: 5.2 10*3/uL (ref 3.8–10.6)

## 2015-12-04 MED ORDER — HEPARIN SOD (PORK) LOCK FLUSH 100 UNIT/ML IV SOLN
INTRAVENOUS | Status: AC
  Filled 2015-12-04: qty 5

## 2015-12-04 MED ORDER — PROCHLORPERAZINE MALEATE 10 MG PO TABS: 10.0000 mg | ORAL_TABLET | Freq: Four times a day (QID) | ORAL | Status: DC | PRN

## 2015-12-04 MED ORDER — ONDANSETRON HCL 8 MG PO TABS: 8.0000 mg | ORAL_TABLET | Freq: Two times a day (BID) | ORAL | Status: DC | PRN

## 2015-12-04 MED ORDER — DEXAMETHASONE SODIUM PHOSPHATE 100 MG/10ML IJ SOLN
20.0000 mg | Freq: Once | INTRAMUSCULAR | Status: AC
  Administered 2015-12-04: 20 mg via INTRAVENOUS
  Filled 2015-12-04: qty 2

## 2015-12-04 MED ORDER — PALONOSETRON HCL INJECTION 0.25 MG/5ML
0.2500 mg | Freq: Once | INTRAVENOUS | Status: AC
  Administered 2015-12-04: 0.25 mg via INTRAVENOUS
  Filled 2015-12-04: qty 5

## 2015-12-04 MED ORDER — LIDOCAINE-PRILOCAINE 2.5-2.5 % EX CREA: TOPICAL_CREAM | CUTANEOUS | Status: DC

## 2015-12-04 MED ORDER — SODIUM CHLORIDE 0.9 % IV SOLN
Freq: Once | INTRAVENOUS | Status: AC
  Administered 2015-12-04: 10:00:00 via INTRAVENOUS
  Filled 2015-12-04: qty 1000

## 2015-12-04 MED ORDER — HEPARIN SOD (PORK) LOCK FLUSH 100 UNIT/ML IV SOLN
500.0000 [IU] | Freq: Once | INTRAVENOUS | Status: AC | PRN
  Administered 2015-12-04: 500 [IU]

## 2015-12-04 MED ORDER — SODIUM CHLORIDE 0.9% FLUSH
10.0000 mL | Freq: Once | INTRAVENOUS | Status: DC
Start: 2015-12-04 — End: 2015-12-04
  Administered 2015-12-04: 10 mL via INTRAVENOUS
  Filled 2015-12-04: qty 10

## 2015-12-04 MED ORDER — LORAZEPAM 0.5 MG PO TABS: 0.5000 mg | ORAL_TABLET | Freq: Four times a day (QID) | ORAL | Status: DC | PRN

## 2015-12-04 MED ORDER — DEXAMETHASONE 4 MG PO TABS: 8.0000 mg | ORAL_TABLET | Freq: Every day | ORAL | Status: DC

## 2015-12-04 MED ORDER — SODIUM CHLORIDE 0.9 % IV SOLN
150.0000 mg | Freq: Once | INTRAVENOUS | Status: AC
  Administered 2015-12-04: 150 mg via INTRAVENOUS
  Filled 2015-12-04: qty 15

## 2015-12-04 MED ORDER — HEPARIN SOD (PORK) LOCK FLUSH 100 UNIT/ML IV SOLN: 500.0000 [IU] | Freq: Once | INTRAVENOUS | Status: DC

## 2015-12-04 MED ORDER — FAMOTIDINE IN NACL 20-0.9 MG/50ML-% IV SOLN
20.0000 mg | Freq: Once | INTRAVENOUS | Status: AC
  Administered 2015-12-04: 20 mg via INTRAVENOUS
  Filled 2015-12-04: qty 50

## 2015-12-04 MED ORDER — DIPHENHYDRAMINE HCL 50 MG/ML IJ SOLN
25.0000 mg | Freq: Once | INTRAMUSCULAR | Status: AC
  Administered 2015-12-04: 25 mg via INTRAVENOUS
  Filled 2015-12-04: qty 1

## 2015-12-04 MED ORDER — PACLITAXEL CHEMO INJECTION 300 MG/50ML
45.0000 mg/m2 | Freq: Once | INTRAVENOUS | Status: AC
  Administered 2015-12-04: 102 mg via INTRAVENOUS
  Filled 2015-12-04: qty 17

## 2015-12-04 NOTE — Progress Notes (Signed)
.[place] NOTE  Patient Care Team: Chesley Noon, MD as PCP - General (Family Medicine)  CHIEF COMPLAINTS/PURPOSE OF CONSULTATION:  Oncology History   # June 2017- Localized [? Stage II; no EUS]Distal Esophagus Adeno CA [Bx Dr.Elliot]; PET- no distant Mets; July 5th 2017-concurrent Carbo-Taxol- RT  # LLL nodule- monitor for now [PET June 2017]   # CKD [creat 1.4-1.7]; Hx of stroke [facility] 2016]  June 2017-MOLECULAR STUDIES- Her 2- IHC-NEG; MMR??     Esophageal cancer ([place])   11/10/2015 Initial Diagnosis Esophageal cancer (HCC)- distal esophagus- non-circumferential partially obstructing ulcerating mass- biopsy positive for mod diff  adenocarcinoma [Dr.Elliot]     HISTORY OF PRESENTING ILLNESS:  Marcus [name] 82 y.o.  male With new diagnosis of adenocarcinoma of the esophagus is here toProceed with weekly carbotaxol along with radiation.  Patient in the interim underwent Mediport placement; procedure was uneventful.    Patient denies any weight loss. Denies any difficulty swallowing food or liquids. No nausea no vomiting. No chest pain or shortness of breath or cough. No fevers or chills.  He continues to walk with a cane given history of stroke.   ROS: A complete 10 point review of system is done which is negative except mentioned above in history of present illness  MEDICAL HISTORY:  Past Medical History  Diagnosis Date  . Hypertension   . Hyperlipemia   . History of stress test 09/02/2009    abnormal myocardial perfusion scan demonstrating an attenuation defect in the inferior region of the myocardium. No ischemia or infarct/scar is seen in the remaining myocardium. No prior study available for comparison. Abnormal myocardial study EF 79%  . History of Doppler ultrasound     dopplers revealed mild left ICA stenosis which has remained stable, occluded left subclavian artery with retrograde left vertebral filling and moderately severe right  subclavian artery stenosis. he has no symptoms of upper extermity claudication or subclavian steal symptoms.   . Subclavian artery stenosis ([place])   . Stroke ([place])   . Arthritis   . Cancer ([place])     SKIN  . Esophageal cancer ([place])   . GERD (gastroesophageal reflux disease)   . Glaucoma   . Macular degeneration   . Aphakia of right eye   . History of CVA (cerebrovascular accident)     Cerebral infarction Nov. 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 Gout   . Enlarged prostate without lower urinary tract symptoms (luts)   . History of DVT (deep vein thrombosis)     right arm    SURGICAL HISTORY: Past Surgical History  Procedure Laterality Date  . Cataract extraction    . Fracture surgery    . Mohs surgery    . Esophagogastroduodenoscopy (egd) with propofol N/A 11/10/2015    Procedure: ESOPHAGOGASTRODUODENOSCOPY (EGD) WITH PROPOFOL;  Surgeon: Manya Silvas, MD;  Location: [place];  Service: Endoscopy;  Laterality: N/A;  . Colonoscopy  2002  . Peripheral vascular catheterization N/A 11/29/2015    Procedure: Glori Luis Cath Insertion;  Surgeon: Algernon Huxley, MD;  Location: [place] [facility];  Service: Cardiovascular;  Laterality: N/A;    SOCIAL HISTORY: [place]; worked in office.  Social History   Social History  . Marital Status: Married    Spouse Name: N/A  . Number of Children: N/A  . Years of Education: N/A   Occupational History  . Not on file.   Social History Main Topics  . Smoking status:  Former Smoker -- 1.00 packs/day for 40 years    Types: Cigarettes    Quit date: 06/03/1993  . Smokeless tobacco: Never Used  . Alcohol Use: No  . Drug Use: No  . Sexual Activity: Not on file   Other Topics Concern  . Not on file   Social History Narrative    FAMILY HISTORY: Family History  Problem Relation Age of Onset  . Heart disease Brother   . Cancer - Colon Sister   . Cancer Sister   . Other Brother     CABG    ALLERGIES:  is allergic to  codeine; allopurinol; simvastatin; and uloric.  MEDICATIONS:  Current Outpatient Prescriptions  Medication Sig Dispense Refill  . atorvastatin (LIPITOR) 20 MG tablet Take 1 tablet (20 mg total) by mouth daily at 6 PM. 30 tablet 1  . clopidogrel (PLAVIX) 75 MG tablet Take 1 tablet (75 mg total) by mouth daily. 30 tablet 1  . Coenzyme Q10 (COQ10) 100 MG CAPS Take by mouth. Reported on 11/10/2015    . cyanocobalamin 500 MCG tablet Take 500 mcg by mouth daily.    . dorzolamide (TRUSOPT) 2 % ophthalmic solution Place 1 drop into both eyes 2 (two) times daily.    Marland Kitchen doxazosin (CARDURA) 2 MG tablet Take 2 mg by mouth daily.    . finasteride (PROSCAR) 5 MG tablet Take 5 mg by mouth daily.  10  . lansoprazole (PREVACID) 30 MG capsule Take 1 capsule (30 mg total) by mouth daily at 12 noon. 30 capsule 11  . latanoprost (XALATAN) 0.005 % ophthalmic solution Place 1 drop into both eyes at bedtime.    . lidocaine-prilocaine (EMLA) cream Apply 1 application topically as needed. Apply generously over the Mediport 45 minutes prior to chemotherapy. 30 g 0  . magnesium oxide (MAG-OX) 400 MG tablet Take 400 mg by mouth daily.    . meloxicam (MOBIC) 7.5 MG tablet Take 7.5 mg by mouth daily as needed. For pain  5  . metoprolol succinate (TOPROL-XL) 25 MG 24 hr tablet TAKE ONE TABLET (25 MG TOTAL) BY MOUTH DAILY.    Marland Kitchen ondansetron (ZOFRAN) 8 MG tablet Take 1 tablet (8 mg total) by mouth every 8 (eight) hours as needed for nausea or vomiting (start 3 days; after chemo). 40 tablet 1  . prochlorperazine (COMPAZINE) 10 MG tablet Take 1 tablet (10 mg total) by mouth every 6 (six) hours as needed for nausea or vomiting. 40 tablet 1  . RA LANSOPRAZOLE 15 MG capsule take 2 capsules by mouth once daily AT 12 NOON  1  . terazosin (HYTRIN) 10 MG capsule Take 10 mg by mouth at bedtime.    . timolol (TIMOPTIC) 0.5 % ophthalmic solution Place 1 drop into both eyes 2 (two) times daily.  3  . topiramate (TOPAMAX) 25 MG tablet Take 25  mg by mouth 2 (two) times daily.  0  . Vitamin D, Ergocalciferol, (DRISDOL) 50000 UNITS CAPS Take 50,000 Units by mouth every 7 (seven) days.    Marland Kitchen dexamethasone (DECADRON) 4 MG tablet Take 2 tablets (8 mg total) by mouth daily. Start the day after chemotherapy for 2 days. 30 tablet 1  . lidocaine-prilocaine (EMLA) cream Apply to affected area once 30 g 3  . ondansetron (ZOFRAN) 8 MG tablet Take 1 tablet (8 mg total) by mouth 2 (two) times daily as needed for refractory nausea / vomiting. Start on day 3 after chemo. 30 tablet 1  . prochlorperazine (COMPAZINE) 10 MG tablet  Take 1 tablet (10 mg total) by mouth every 6 (six) hours as needed (Nausea or vomiting). 30 tablet 1   No current facility-administered medications for this visit.      Marland Kitchen  PHYSICAL EXAMINATION: ECOG PERFORMANCE STATUS: 1 - Symptomatic but completely ambulatory  Filed Vitals:   12/04/15 0907  BP: 159/72  Pulse: 65  Temp: 97.3 F (36.3 C)  Resp: 18   Filed Weights   12/04/15 0907  Weight: 210 lb 12.2 oz (95.6 kg)    GENERAL: Well-nourished well-developed; Alert, no distress and comfortable.   Accompanied by his wife. He walks with a cane. EYES: no pallor or icterus OROPHARYNX: no thrush or ulceration NECK: supple, no masses felt LYMPH:  no palpable lymphadenopathy in the cervical, axillary or inguinal regions LUNGS: clear to auscultation and  No wheeze or crackles HEART/CVS: regular rate & rhythm and no murmurs; No lower extremity edema ABDOMEN: abdomen soft, non-tender and normal bowel sounds Musculoskeletal:no cyanosis of digits and no clubbing  PSYCH: alert & oriented x 3 with fluent speech NEURO: no focal motor/sensory deficits SKIN:  no rashes or significant lesions  LABORATORY DATA:  I have reviewed the data as listed Lab Results  Component Value Date   WBC 5.2 12/04/2015   HGB 12.0* 12/04/2015   HCT 34.5* 12/04/2015   MCV 99.2 12/04/2015   PLT 151 12/04/2015    Recent Labs  12/25/14 0750  12/26/14 0831 11/15/15 1250 12/04/15 0846  NA 137 137 137 138  K 5.2* 4.5 5.3* 4.5  CL 109 108 106 111  CO2 20* 21* 23 23  GLUCOSE 107* 148* 104* 178*  BUN 29* 30* 33* 32*  CREATININE 1.34* 1.40* 1.74* 1.48*  CALCIUM 9.3 9.0 8.8* 8.5*  GFRNONAA 48* 46* 35* 42*  GFRAA 56* 53* 40* 49*  PROT 6.9  --  8.4* 7.1  ALBUMIN 3.3*  --  4.2 3.6  AST 18  --  19 18  ALT 12*  --  15* 10*  ALKPHOS 82  --  98 84  BILITOT 0.5  --  0.8 0.5    RADIOGRAPHIC STUDIES: I have personally reviewed the radiological images as listed and agreed with the findings in the report. Nm Pet Image Initial (pi) Skull Base To Thigh  11/22/2015  CLINICAL DATA:  Initial treatment strategy for esophageal carcinoma. EXAM: NUCLEAR MEDICINE PET SKULL BASE TO THIGH TECHNIQUE: 12.45 mCi F-18 FDG was injected intravenously. Full-ring PET imaging was performed from the skull base to thigh after the radiotracer. CT data was obtained and used for attenuation correction and anatomic localization. FASTING BLOOD GLUCOSE:  Value: 113 mg/dl COMPARISON:  None. FINDINGS: NECK No hypermetabolic lymph nodes in the neck. CHEST No hypermetabolic mediastinal or hilar nodes. No suspicious pulmonary nodules on the CT scan. Malignant range FDG uptake is associated with the distal esophageal mass. This mass measures approximately 3.4 x 3.0 x 4.5 cm and has an SUV max equal to 8.64. Indeterminate nodular density within the left lower lobe measures 2.3 x 0.9 cm and has an SUV max equal to 1.49. ABDOMEN/PELVIS No abnormal hypermetabolic activity within the liver, pancreas, adrenal glands, or spleen. No hypermetabolic lymph nodes in the abdomen or pelvis. SKELETON No focal hypermetabolic activity to suggest skeletal metastasis. There is a focal area of increased uptake localizing to the left 12th costovertebral junction. There are corresponding degenerative changes on the CT images, image 150 of series 3. IMPRESSION: 1. Intense FDG uptake is associated with the  distal esophageal mass  compatible with primary esophageal carcinoma. 2. No hypermetabolic mediastinal or upper abdominal adenopathy. 3. There is a indeterminate nodular density within the left lower lobe which exhibits borderline FDG uptake. This may be inflammatory or infectious. Metastatic disease less favored. Recommend three-month follow-up examination with repeat CT of the chest. 4. Increased uptake localizes to the left twelfth costovertebral junction. No corresponding suspicious bone lesion. This is favored to represent arthropathic change. Electronically Signed   By: Kerby Moors M.D.   On: 11/22/2015 11:31    ASSESSMENT & PLAN:  Esophageal cancer ([place]) Localized esophageal cancer; no evidence of any local adenopathy. On concurent definitive chemo [July 3rd]-RT  [July 5th]. Cycle # 1 today.  # CKD- creat 1.5- recommend increased fluids intake.   # weekly carbo-taxol/labs/ follow with me in 2 weeks.    Cammie Sickle, MD 12/04/2015 6:28 PM

## 2015-12-04 NOTE — Assessment & Plan Note (Addendum)
Localized esophageal cancer; no evidence of any local adenopathy. On concurent definitive chemo [July 3rd]-RT  [July 5th]. Cycle # 1 today.  # CKD- creat 1.5- recommend increased fluids intake.   # weekly carbo-taxol/labs/ follow with me in 2 weeks.

## 2015-12-06 ENCOUNTER — Ambulatory Visit
Admission: RE | Admit: 2015-12-06 | Discharge: 2015-12-06 | Disposition: A | Discharge status: Home / Self Care | Payer: Medicare Other | Source: Ambulatory Visit | Attending: Radiation Oncology | Admitting: Radiation Oncology

## 2015-12-06 DIAGNOSIS — C155 Malignant neoplasm of lower third of esophagus: Secondary | ICD-10-CM | POA: Diagnosis not present

## 2015-12-07 ENCOUNTER — Ambulatory Visit
Admission: RE | Admit: 2015-12-07 | Discharge: 2015-12-07 | Disposition: A | Discharge status: Home / Self Care | Payer: Medicare Other | Source: Ambulatory Visit | Attending: Radiation Oncology | Admitting: Radiation Oncology

## 2015-12-07 DIAGNOSIS — C155 Malignant neoplasm of lower third of esophagus: Secondary | ICD-10-CM | POA: Diagnosis not present

## 2015-12-08 ENCOUNTER — Ambulatory Visit
Admission: RE | Admit: 2015-12-08 | Discharge: 2015-12-08 | Disposition: A | Discharge status: Home / Self Care | Payer: Medicare Other | Source: Ambulatory Visit | Attending: Radiation Oncology | Admitting: Radiation Oncology

## 2015-12-08 ENCOUNTER — Other Ambulatory Visit: Payer: Self-pay | Admitting: Internal Medicine

## 2015-12-08 DIAGNOSIS — C155 Malignant neoplasm of lower third of esophagus: Secondary | ICD-10-CM | POA: Diagnosis not present

## 2015-12-11 ENCOUNTER — Ambulatory Visit
Admission: RE | Admit: 2015-12-11 | Discharge: 2015-12-11 | Disposition: A | Discharge status: Home / Self Care | Payer: Medicare Other | Source: Ambulatory Visit | Attending: Radiation Oncology | Admitting: Radiation Oncology

## 2015-12-11 ENCOUNTER — Inpatient Hospital Stay: Payer: Medicare Other

## 2015-12-11 ENCOUNTER — Ambulatory Visit: Payer: Medicare Other | Admitting: Internal Medicine

## 2015-12-11 ENCOUNTER — Other Ambulatory Visit: Payer: Self-pay | Admitting: Internal Medicine

## 2015-12-11 VITALS — BP 137/65 | HR 48 | Temp 97.2°F | Resp 16

## 2015-12-11 DIAGNOSIS — C155 Malignant neoplasm of lower third of esophagus: Secondary | ICD-10-CM | POA: Diagnosis not present

## 2015-12-11 DIAGNOSIS — Z5111 Encounter for antineoplastic chemotherapy: Secondary | ICD-10-CM | POA: Diagnosis not present

## 2015-12-11 DIAGNOSIS — C159 Malignant neoplasm of esophagus, unspecified: Secondary | ICD-10-CM

## 2015-12-11 LAB — COMPREHENSIVE METABOLIC PANEL
ALBUMIN: 3.3 g/dL — AB (ref 3.5–5.0)
ALT: 16 U/L — AB (ref 17–63)
AST: 17 U/L (ref 15–41)
Alkaline Phosphatase: 74 U/L (ref 38–126)
Anion gap: 5 (ref 5–15)
BUN: 41 mg/dL — AB (ref 6–20)
CHLORIDE: 108 mmol/L (ref 101–111)
CO2: 22 mmol/L (ref 22–32)
CREATININE: 1.27 mg/dL — AB (ref 0.61–1.24)
Calcium: 8 mg/dL — ABNORMAL LOW (ref 8.9–10.3)
GFR calc Af Amer: 59 mL/min — ABNORMAL LOW (ref >60)
GFR, EST NON AFRICAN AMERICAN: 51 mL/min — AB (ref >60)
GLUCOSE: 169 mg/dL — AB (ref 65–99)
Potassium: 4.4 mmol/L (ref 3.5–5.1)
Sodium: 135 mmol/L (ref 135–145)
Total Bilirubin: 0.5 mg/dL (ref 0.3–1.2)
Total Protein: 6 g/dL — ABNORMAL LOW (ref 6.5–8.1)

## 2015-12-11 LAB — CBC WITH DIFFERENTIAL/PLATELET
Basophils Absolute: 0 10*3/uL (ref 0–0.1)
Basophils Relative: 0 %
EOS ABS: 0.2 10*3/uL (ref 0–0.7)
EOS PCT: 5 %
HCT: 32.8 % — ABNORMAL LOW (ref 40.0–52.0)
Hemoglobin: 11.4 g/dL — ABNORMAL LOW (ref 13.0–18.0)
LYMPHS ABS: 0.6 10*3/uL — AB (ref 1.0–3.6)
Lymphocytes Relative: 12 %
MCH: 34.4 pg — AB (ref 26.0–34.0)
MCHC: 34.9 g/dL (ref 32.0–36.0)
MCV: 98.7 fL (ref 80.0–100.0)
MONO ABS: 0.2 10*3/uL (ref 0.2–1.0)
Monocytes Relative: 4 %
Neutro Abs: 4.3 10*3/uL (ref 1.4–6.5)
Neutrophils Relative %: 79 %
PLATELETS: 139 10*3/uL — AB (ref 150–440)
RBC: 3.32 MIL/uL — AB (ref 4.40–5.90)
RDW: 13.2 % (ref 11.5–14.5)
WBC: 5.4 10*3/uL (ref 3.8–10.6)

## 2015-12-11 MED ORDER — SODIUM CHLORIDE 0.9% FLUSH
10.0000 mL | INTRAVENOUS | Status: DC | PRN
  Administered 2015-12-11: 10 mL via INTRAVENOUS
  Filled 2015-12-11: qty 10

## 2015-12-11 MED ORDER — SODIUM CHLORIDE 0.9 % IV SOLN
20.0000 mg | Freq: Once | INTRAVENOUS | Status: AC
  Administered 2015-12-11: 20 mg via INTRAVENOUS
  Filled 2015-12-11: qty 2

## 2015-12-11 MED ORDER — PALONOSETRON HCL INJECTION 0.25 MG/5ML
0.2500 mg | Freq: Once | INTRAVENOUS | Status: AC
  Administered 2015-12-11: 0.25 mg via INTRAVENOUS
  Filled 2015-12-11: qty 5

## 2015-12-11 MED ORDER — FAMOTIDINE IN NACL 20-0.9 MG/50ML-% IV SOLN
20.0000 mg | Freq: Once | INTRAVENOUS | Status: AC
Start: 2015-12-11 — End: 2015-12-11
  Administered 2015-12-11: 20 mg via INTRAVENOUS
  Filled 2015-12-11: qty 50

## 2015-12-11 MED ORDER — CARBOPLATIN CHEMO INJECTION 450 MG/45ML
171.2000 mg | Freq: Once | INTRAVENOUS | Status: AC
  Administered 2015-12-11: 170 mg via INTRAVENOUS
  Filled 2015-12-11: qty 17

## 2015-12-11 MED ORDER — SODIUM CHLORIDE 0.9 % IV SOLN
Freq: Once | INTRAVENOUS | Status: AC
  Administered 2015-12-11: 10:00:00 via INTRAVENOUS
  Filled 2015-12-11: qty 1000

## 2015-12-11 MED ORDER — HEPARIN SOD (PORK) LOCK FLUSH 100 UNIT/ML IV SOLN
500.0000 [IU] | Freq: Once | INTRAVENOUS | Status: AC
  Administered 2015-12-11: 500 [IU] via INTRAVENOUS
  Filled 2015-12-11: qty 5

## 2015-12-11 MED ORDER — DIPHENHYDRAMINE HCL 50 MG/ML IJ SOLN
25.0000 mg | Freq: Once | INTRAMUSCULAR | Status: AC
  Administered 2015-12-11: 25 mg via INTRAVENOUS
  Filled 2015-12-11: qty 1

## 2015-12-11 MED ORDER — PACLITAXEL CHEMO INJECTION 300 MG/50ML
45.0000 mg/m2 | Freq: Once | INTRAVENOUS | Status: AC
  Administered 2015-12-11: 102 mg via INTRAVENOUS
  Filled 2015-12-11: qty 17

## 2015-12-11 NOTE — Progress Notes (Signed)
Last Carboplatin dose was 150mg . SCr has improved and todays calculated carboplatin dose is 171 which is more than 10% difference. Called and spoke with Dr B who stated he would like to increase dose to 170mg .

## 2015-12-12 ENCOUNTER — Ambulatory Visit
Admission: RE | Admit: 2015-12-12 | Discharge: 2015-12-12 | Disposition: A | Discharge status: Home / Self Care | Payer: Medicare Other | Source: Ambulatory Visit | Attending: Radiation Oncology | Admitting: Radiation Oncology

## 2015-12-12 DIAGNOSIS — C155 Malignant neoplasm of lower third of esophagus: Secondary | ICD-10-CM | POA: Diagnosis not present

## 2015-12-13 ENCOUNTER — Ambulatory Visit
Admission: RE | Admit: 2015-12-13 | Discharge: 2015-12-13 | Disposition: A | Discharge status: Home / Self Care | Payer: Medicare Other | Source: Ambulatory Visit | Attending: Radiation Oncology | Admitting: Radiation Oncology

## 2015-12-13 DIAGNOSIS — C155 Malignant neoplasm of lower third of esophagus: Secondary | ICD-10-CM | POA: Diagnosis not present

## 2015-12-14 ENCOUNTER — Ambulatory Visit
Admission: RE | Admit: 2015-12-14 | Discharge: 2015-12-14 | Disposition: A | Discharge status: Home / Self Care | Payer: Medicare Other | Source: Ambulatory Visit | Attending: Radiation Oncology | Admitting: Radiation Oncology

## 2015-12-14 DIAGNOSIS — C155 Malignant neoplasm of lower third of esophagus: Secondary | ICD-10-CM | POA: Diagnosis not present

## 2015-12-15 ENCOUNTER — Ambulatory Visit
Admission: RE | Admit: 2015-12-15 | Discharge: 2015-12-15 | Disposition: A | Discharge status: Home / Self Care | Payer: Medicare Other | Source: Ambulatory Visit | Attending: Radiation Oncology | Admitting: Radiation Oncology

## 2015-12-15 DIAGNOSIS — C155 Malignant neoplasm of lower third of esophagus: Secondary | ICD-10-CM | POA: Diagnosis not present

## 2015-12-18 ENCOUNTER — Inpatient Hospital Stay (HOSPITAL_BASED_OUTPATIENT_CLINIC_OR_DEPARTMENT_OTHER): Payer: Medicare Other | Admitting: Internal Medicine

## 2015-12-18 ENCOUNTER — Inpatient Hospital Stay: Payer: Medicare Other

## 2015-12-18 ENCOUNTER — Ambulatory Visit
Admission: RE | Admit: 2015-12-18 | Discharge: 2015-12-18 | Disposition: A | Discharge status: Home / Self Care | Payer: Medicare Other | Source: Ambulatory Visit | Attending: Radiation Oncology | Admitting: Radiation Oncology

## 2015-12-18 VITALS — BP 144/68 | HR 62 | Temp 96.9°F | Resp 18 | Wt 211.5 lb

## 2015-12-18 DIAGNOSIS — N189 Chronic kidney disease, unspecified: Secondary | ICD-10-CM | POA: Diagnosis not present

## 2015-12-18 DIAGNOSIS — Z5111 Encounter for antineoplastic chemotherapy: Secondary | ICD-10-CM | POA: Diagnosis not present

## 2015-12-18 DIAGNOSIS — N183 Chronic kidney disease, stage 3 unspecified: Secondary | ICD-10-CM

## 2015-12-18 DIAGNOSIS — C155 Malignant neoplasm of lower third of esophagus: Secondary | ICD-10-CM

## 2015-12-18 DIAGNOSIS — Z79899 Other long term (current) drug therapy: Secondary | ICD-10-CM | POA: Diagnosis not present

## 2015-12-18 DIAGNOSIS — I129 Hypertensive chronic kidney disease with stage 1 through stage 4 chronic kidney disease, or unspecified chronic kidney disease: Secondary | ICD-10-CM

## 2015-12-18 DIAGNOSIS — C159 Malignant neoplasm of esophagus, unspecified: Secondary | ICD-10-CM

## 2015-12-18 LAB — CBC WITH DIFFERENTIAL/PLATELET
BASOS ABS: 0 10*3/uL (ref 0–0.1)
Basophils Relative: 1 %
Eosinophils Absolute: 0.1 10*3/uL (ref 0–0.7)
Eosinophils Relative: 3 %
HEMATOCRIT: 31.5 % — AB (ref 40.0–52.0)
HEMOGLOBIN: 11.2 g/dL — AB (ref 13.0–18.0)
LYMPHS PCT: 13 %
Lymphs Abs: 0.4 10*3/uL — ABNORMAL LOW (ref 1.0–3.6)
MCH: 34.9 pg — ABNORMAL HIGH (ref 26.0–34.0)
MCHC: 35.5 g/dL (ref 32.0–36.0)
MCV: 98.4 fL (ref 80.0–100.0)
MONO ABS: 0.2 10*3/uL (ref 0.2–1.0)
Monocytes Relative: 7 %
NEUTROS ABS: 2.4 10*3/uL (ref 1.4–6.5)
NEUTROS PCT: 76 %
Platelets: 127 10*3/uL — ABNORMAL LOW (ref 150–440)
RBC: 3.2 MIL/uL — AB (ref 4.40–5.90)
RDW: 13.2 % (ref 11.5–14.5)
WBC: 3 10*3/uL — ABNORMAL LOW (ref 3.8–10.6)

## 2015-12-18 LAB — COMPREHENSIVE METABOLIC PANEL
ALBUMIN: 3.1 g/dL — AB (ref 3.5–5.0)
ALK PHOS: 74 U/L (ref 38–126)
ALT: 11 U/L — AB (ref 17–63)
AST: 13 U/L — AB (ref 15–41)
Anion gap: 4 — ABNORMAL LOW (ref 5–15)
BILIRUBIN TOTAL: 0.5 mg/dL (ref 0.3–1.2)
BUN: 35 mg/dL — AB (ref 6–20)
CALCIUM: 7.8 mg/dL — AB (ref 8.9–10.3)
CO2: 21 mmol/L — ABNORMAL LOW (ref 22–32)
CREATININE: 1.3 mg/dL — AB (ref 0.61–1.24)
Chloride: 111 mmol/L (ref 101–111)
GFR calc Af Amer: 57 mL/min — ABNORMAL LOW (ref >60)
GFR, EST NON AFRICAN AMERICAN: 49 mL/min — AB (ref >60)
GLUCOSE: 141 mg/dL — AB (ref 65–99)
Potassium: 4.5 mmol/L (ref 3.5–5.1)
Sodium: 136 mmol/L (ref 135–145)
TOTAL PROTEIN: 5.9 g/dL — AB (ref 6.5–8.1)

## 2015-12-18 MED ORDER — HEPARIN SOD (PORK) LOCK FLUSH 100 UNIT/ML IV SOLN
500.0000 [IU] | Freq: Once | INTRAVENOUS | Status: AC | PRN
  Administered 2015-12-18: 500 [IU]
  Filled 2015-12-18: qty 5

## 2015-12-18 MED ORDER — SODIUM CHLORIDE 0.9 % IV SOLN
20.0000 mg | Freq: Once | INTRAVENOUS | Status: AC
  Administered 2015-12-18: 20 mg via INTRAVENOUS
  Filled 2015-12-18: qty 2

## 2015-12-18 MED ORDER — SODIUM CHLORIDE 0.9% FLUSH
10.0000 mL | INTRAVENOUS | Status: DC | PRN
  Filled 2015-12-18: qty 10

## 2015-12-18 MED ORDER — FAMOTIDINE IN NACL 20-0.9 MG/50ML-% IV SOLN
20.0000 mg | Freq: Once | INTRAVENOUS | Status: AC
  Administered 2015-12-18: 20 mg via INTRAVENOUS
  Filled 2015-12-18: qty 50

## 2015-12-18 MED ORDER — PALONOSETRON HCL INJECTION 0.25 MG/5ML
0.2500 mg | Freq: Once | INTRAVENOUS | Status: AC
  Administered 2015-12-18: 0.25 mg via INTRAVENOUS
  Filled 2015-12-18: qty 5

## 2015-12-18 MED ORDER — SODIUM CHLORIDE 0.9 % IV SOLN
Freq: Once | INTRAVENOUS | Status: AC
  Administered 2015-12-18: 11:00:00 via INTRAVENOUS
  Filled 2015-12-18: qty 1000

## 2015-12-18 MED ORDER — DEXTROSE 5 % IV SOLN
45.0000 mg/m2 | Freq: Once | INTRAVENOUS | Status: AC
  Administered 2015-12-18: 102 mg via INTRAVENOUS
  Filled 2015-12-18: qty 17

## 2015-12-18 MED ORDER — SODIUM CHLORIDE 0.9 % IV SOLN
168.4000 mg | Freq: Once | INTRAVENOUS | Status: AC
  Administered 2015-12-18: 170 mg via INTRAVENOUS
  Filled 2015-12-18: qty 17

## 2015-12-18 MED ORDER — DIPHENHYDRAMINE HCL 50 MG/ML IJ SOLN
25.0000 mg | Freq: Once | INTRAMUSCULAR | Status: AC
  Administered 2015-12-18: 25 mg via INTRAVENOUS
  Filled 2015-12-18: qty 1

## 2015-12-18 NOTE — Assessment & Plan Note (Signed)
Localized esophageal cancer; no evidence of any local adenopathy. On concurent definitive chemo [July 3rd]-RT  [July 5th]. Patient tolerating chemotherapy very well [RT until Aug 8th per pt].  # Labs reviewed adequate. Proceed with chemotherapy today. Again reminded the patient that he will need consolidation chemotherapy approximately 2-3 weeks post chemoradiation. Likely plan 5-FU plus minus oxaliplatin.   # CKD- creat 1.3- stable.   # weekly carbo-taxol/labs/ follow with me in 2 weeks.

## 2015-12-18 NOTE — Progress Notes (Signed)
.[place] NOTE  Patient Care Team: Chesley Noon, MD as PCP - General (Family Medicine)  CHIEF COMPLAINTS/PURPOSE OF CONSULTATION:  Oncology History   # June 2017- Localized [? Stage II; no EUS]Distal Esophagus Adeno CA [Bx Dr.Elliot]; PET- no distant Mets; July 5th 2017-concurrent Carbo-Taxol- RT  # LLL nodule- monitor for now [PET June 2017]   # CKD [creat 1.4-1.7]; Hx of stroke [facility] 2016]  June 2017-MOLECULAR STUDIES- Her 2- IHC-NEG; MMR-12/18/2015- ordered.      Esophageal cancer ([place])   11/10/2015 Initial Diagnosis Esophageal cancer (HCC)- distal esophagus- non-circumferential partially obstructing ulcerating mass- biopsy positive for mod diff  adenocarcinoma [Dr.Elliot]    Malignant neoplasm of lower third of esophagus ([place])   12/18/2015 Initial Diagnosis Malignant neoplasm of lower third of esophagus (HCC)     HISTORY OF PRESENTING ILLNESS:  Marcus [name] 82 y.o.  male With new diagnosis of adenocarcinoma of the esophagus is here toProceed with weekly carbotaxol along with radiation.Denies any chest pain or shortness of breath or cough.    Patient denies any weight loss. Denies any difficulty swallowing food or liquids. No nausea no vomiting.  No fevers or chills.  He continues to walk with a cane given history of stroke.   ROS: A complete 10 point review of system is done which is negative except mentioned above in history of present illness  MEDICAL HISTORY:  Past Medical History  Diagnosis Date  . Hypertension   . Hyperlipemia   . History of stress test 09/02/2009    abnormal myocardial perfusion scan demonstrating an attenuation defect in the inferior region of the myocardium. No ischemia or infarct/scar is seen in the remaining myocardium. No prior study available for comparison. Abnormal myocardial study EF 79%  . History of Doppler ultrasound     dopplers revealed mild left ICA stenosis which has remained stable, occluded left  subclavian artery with retrograde left vertebral filling and moderately severe right subclavian artery stenosis. he has no symptoms of upper extermity claudication or subclavian steal symptoms.   . Subclavian artery stenosis ([place])   . Stroke ([place])   . Arthritis   . Cancer ([place])     SKIN  . Esophageal cancer ([place])   . GERD (gastroesophageal reflux disease)   . Glaucoma   . Macular degeneration   . Aphakia of right eye   . History of CVA (cerebrovascular accident)     Cerebral infarction Nov. 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 Gout   . Enlarged prostate without lower urinary tract symptoms (luts)   . History of DVT (deep vein thrombosis)     right arm    SURGICAL HISTORY: Past Surgical History  Procedure Laterality Date  . Cataract extraction    . Fracture surgery    . Mohs surgery    . Esophagogastroduodenoscopy (egd) with propofol N/A 11/10/2015    Procedure: ESOPHAGOGASTRODUODENOSCOPY (EGD) WITH PROPOFOL;  Surgeon: Manya Silvas, MD;  Location: [place];  Service: Endoscopy;  Laterality: N/A;  . Colonoscopy  2002  . Peripheral vascular catheterization N/A 11/29/2015    Procedure: Glori Luis Cath Insertion;  Surgeon: Algernon Huxley, MD;  Location: [place] [facility];  Service: Cardiovascular;  Laterality: N/A;    SOCIAL HISTORY: [place]; worked in office.  Social History   Social History  . Marital Status: Married    Spouse Name: N/A  . Number of Children: N/A  . Years of Education: N/A   Occupational  History  . Not on file.   Social History Main Topics  . Smoking status: Former Smoker -- 1.00 packs/day for 40 years    Types: Cigarettes    Quit date: 06/03/1993  . Smokeless tobacco: Never Used  . Alcohol Use: No  . Drug Use: No  . Sexual Activity: Not on file   Other Topics Concern  . Not on file   Social History Narrative    FAMILY HISTORY: Family History  Problem Relation Age of Onset  . Heart disease Brother   . Cancer - Colon Sister    . Cancer Sister   . Other Brother     CABG    ALLERGIES:  is allergic to codeine; allopurinol; simvastatin; and uloric.  MEDICATIONS:  Current Outpatient Prescriptions  Medication Sig Dispense Refill  . atorvastatin (LIPITOR) 20 MG tablet Take 1 tablet (20 mg total) by mouth daily at 6 PM. 30 tablet 1  . clopidogrel (PLAVIX) 75 MG tablet Take 1 tablet (75 mg total) by mouth daily. 30 tablet 1  . Coenzyme Q10 (COQ10) 100 MG CAPS Take by mouth. Reported on 11/10/2015    . cyanocobalamin 500 MCG tablet Take 500 mcg by mouth daily.    Marland Kitchen dexamethasone (DECADRON) 4 MG tablet Take 2 tablets (8 mg total) by mouth daily. Start the day after chemotherapy for 2 days. 30 tablet 1  . dorzolamide (TRUSOPT) 2 % ophthalmic solution Place 1 drop into both eyes 2 (two) times daily.    Marland Kitchen doxazosin (CARDURA) 2 MG tablet Take 2 mg by mouth daily.    . finasteride (PROSCAR) 5 MG tablet Take 5 mg by mouth daily.  10  . lansoprazole (PREVACID) 30 MG capsule Take 1 capsule (30 mg total) by mouth daily at 12 noon. 30 capsule 11  . latanoprost (XALATAN) 0.005 % ophthalmic solution Place 1 drop into both eyes at bedtime.    . lidocaine-prilocaine (EMLA) cream Apply 1 application topically as needed. Apply generously over the Mediport 45 minutes prior to chemotherapy. 30 g 0  . lidocaine-prilocaine (EMLA) cream Apply to affected area once 30 g 3  . magnesium oxide (MAG-OX) 400 MG tablet Take 400 mg by mouth daily.    . metoprolol succinate (TOPROL-XL) 25 MG 24 hr tablet TAKE ONE TABLET (25 MG TOTAL) BY MOUTH DAILY.    Marland Kitchen ondansetron (ZOFRAN) 8 MG tablet Take 1 tablet (8 mg total) by mouth every 8 (eight) hours as needed for nausea or vomiting (start 3 days; after chemo). 40 tablet 1  . ondansetron (ZOFRAN) 8 MG tablet Take 1 tablet (8 mg total) by mouth 2 (two) times daily as needed for refractory nausea / vomiting. Start on day 3 after chemo. 30 tablet 1  . prochlorperazine (COMPAZINE) 10 MG tablet Take 1 tablet (10 mg  total) by mouth every 6 (six) hours as needed for nausea or vomiting. 40 tablet 1  . prochlorperazine (COMPAZINE) 10 MG tablet Take 1 tablet (10 mg total) by mouth every 6 (six) hours as needed (Nausea or vomiting). 30 tablet 1  . RA LANSOPRAZOLE 15 MG capsule take 2 capsules by mouth once daily AT 12 NOON  1  . terazosin (HYTRIN) 10 MG capsule Take 10 mg by mouth at bedtime.    . timolol (TIMOPTIC) 0.5 % ophthalmic solution Place 1 drop into both eyes 2 (two) times daily.  3  . topiramate (TOPAMAX) 25 MG tablet Take 25 mg by mouth 2 (two) times daily.  0  . Vitamin  D, Ergocalciferol, (DRISDOL) 50000 UNITS CAPS Take 50,000 Units by mouth every 7 (seven) days.     No current facility-administered medications for this visit.      Marland Kitchen  PHYSICAL EXAMINATION: ECOG PERFORMANCE STATUS: 1 - Symptomatic but completely ambulatory  Filed Vitals:   12/18/15 1006  BP: 144/68  Pulse: 62  Temp: 96.9 F (36.1 C)  Resp: 18   Filed Weights   12/18/15 1006  Weight: 211 lb 8 oz (95.936 kg)    GENERAL: Well-nourished well-developed; Alert, no distress and comfortable.   Accompanied by his wife. He walks with a cane. EYES: no pallor or icterus OROPHARYNX: no thrush or ulceration NECK: supple, no masses felt LYMPH:  no palpable lymphadenopathy in the cervical, axillary or inguinal regions LUNGS: clear to auscultation and  No wheeze or crackles HEART/CVS: regular rate & rhythm and no murmurs; No lower extremity edema ABDOMEN: abdomen soft, non-tender and normal bowel sounds Musculoskeletal:no cyanosis of digits and no clubbing  PSYCH: alert & oriented x 3 with fluent speech NEURO: no focal motor/sensory deficits SKIN:  no rashes or significant lesions  LABORATORY DATA:  I have reviewed the data as listed Lab Results  Component Value Date   WBC 3.0* 12/18/2015   HGB 11.2* 12/18/2015   HCT 31.5* 12/18/2015   MCV 98.4 12/18/2015   PLT 127* 12/18/2015    Recent Labs  12/04/15 0846  12/11/15 0900 12/18/15 0913  NA 138 135 136  K 4.5 4.4 4.5  CL 111 108 111  CO2 23 22 21*  GLUCOSE 178* 169* 141*  BUN 32* 41* 35*  CREATININE 1.48* 1.27* 1.30*  CALCIUM 8.5* 8.0* 7.8*  GFRNONAA 42* 51* 49*  GFRAA 49* 59* 57*  PROT 7.1 6.0* 5.9*  ALBUMIN 3.6 3.3* 3.1*  AST 18 17 13*  ALT 10* 16* 11*  ALKPHOS 84 74 74  BILITOT 0.5 0.5 0.5    RADIOGRAPHIC STUDIES: I have personally reviewed the radiological images as listed and agreed with the findings in the report. Nm Pet Image Initial (pi) Skull Base To Thigh  11/22/2015  CLINICAL DATA:  Initial treatment strategy for esophageal carcinoma. EXAM: NUCLEAR MEDICINE PET SKULL BASE TO THIGH TECHNIQUE: 12.45 mCi F-18 FDG was injected intravenously. Full-ring PET imaging was performed from the skull base to thigh after the radiotracer. CT data was obtained and used for attenuation correction and anatomic localization. FASTING BLOOD GLUCOSE:  Value: 113 mg/dl COMPARISON:  None. FINDINGS: NECK No hypermetabolic lymph nodes in the neck. CHEST No hypermetabolic mediastinal or hilar nodes. No suspicious pulmonary nodules on the CT scan. Malignant range FDG uptake is associated with the distal esophageal mass. This mass measures approximately 3.4 x 3.0 x 4.5 cm and has an SUV max equal to 8.64. Indeterminate nodular density within the left lower lobe measures 2.3 x 0.9 cm and has an SUV max equal to 1.49. ABDOMEN/PELVIS No abnormal hypermetabolic activity within the liver, pancreas, adrenal glands, or spleen. No hypermetabolic lymph nodes in the abdomen or pelvis. SKELETON No focal hypermetabolic activity to suggest skeletal metastasis. There is a focal area of increased uptake localizing to the left 12th costovertebral junction. There are corresponding degenerative changes on the CT images, image 150 of series 3. IMPRESSION: 1. Intense FDG uptake is associated with the distal esophageal mass compatible with primary esophageal carcinoma. 2. No  hypermetabolic mediastinal or upper abdominal adenopathy. 3. There is a indeterminate nodular density within the left lower lobe which exhibits borderline FDG uptake. This may be  inflammatory or infectious. Metastatic disease less favored. Recommend three-month follow-up examination with repeat CT of the chest. 4. Increased uptake localizes to the left twelfth costovertebral junction. No corresponding suspicious bone lesion. This is favored to represent arthropathic change. Electronically Signed   By: Kerby Moors M.D.   On: 11/22/2015 11:31    ASSESSMENT & PLAN:  Malignant neoplasm of lower third of esophagus (HCC) Localized esophageal cancer; no evidence of any local adenopathy. On concurent definitive chemo [July 3rd]-RT  [July 5th]. Patient tolerating chemotherapy very well [RT until Aug 8th per pt].  # Labs reviewed adequate. Proceed with chemotherapy today. Again reminded the patient that he will need consolidation chemotherapy approximately 2-3 weeks post chemoradiation. Likely plan 5-FU plus minus oxaliplatin.   # CKD- creat 1.3- stable.   # weekly carbo-taxol/labs/ follow with me in 2 weeks.    Cammie Sickle, MD 12/19/2015 7:21 AM

## 2015-12-19 ENCOUNTER — Ambulatory Visit
Admission: RE | Admit: 2015-12-19 | Discharge: 2015-12-19 | Disposition: A | Discharge status: Home / Self Care | Payer: Medicare Other | Source: Ambulatory Visit | Attending: Radiation Oncology | Admitting: Radiation Oncology

## 2015-12-19 DIAGNOSIS — Z5111 Encounter for antineoplastic chemotherapy: Secondary | ICD-10-CM | POA: Insufficient documentation

## 2015-12-19 DIAGNOSIS — C155 Malignant neoplasm of lower third of esophagus: Secondary | ICD-10-CM | POA: Diagnosis not present

## 2015-12-20 ENCOUNTER — Ambulatory Visit
Admission: RE | Admit: 2015-12-20 | Discharge: 2015-12-20 | Disposition: A | Discharge status: Home / Self Care | Payer: Medicare Other | Source: Ambulatory Visit | Attending: Radiation Oncology | Admitting: Radiation Oncology

## 2015-12-20 DIAGNOSIS — C155 Malignant neoplasm of lower third of esophagus: Secondary | ICD-10-CM | POA: Diagnosis not present

## 2015-12-21 ENCOUNTER — Ambulatory Visit
Admission: RE | Admit: 2015-12-21 | Discharge: 2015-12-21 | Disposition: A | Discharge status: Home / Self Care | Payer: Medicare Other | Source: Ambulatory Visit | Attending: Radiation Oncology | Admitting: Radiation Oncology

## 2015-12-21 DIAGNOSIS — C155 Malignant neoplasm of lower third of esophagus: Secondary | ICD-10-CM | POA: Diagnosis not present

## 2015-12-22 ENCOUNTER — Ambulatory Visit
Admission: RE | Admit: 2015-12-22 | Discharge: 2015-12-22 | Disposition: A | Discharge status: Home / Self Care | Payer: Medicare Other | Source: Ambulatory Visit | Attending: Radiation Oncology | Admitting: Radiation Oncology

## 2015-12-22 DIAGNOSIS — C155 Malignant neoplasm of lower third of esophagus: Secondary | ICD-10-CM | POA: Diagnosis not present

## 2015-12-25 ENCOUNTER — Inpatient Hospital Stay: Payer: Medicare Other

## 2015-12-25 ENCOUNTER — Ambulatory Visit
Admission: RE | Admit: 2015-12-25 | Discharge: 2015-12-25 | Disposition: A | Discharge status: Home / Self Care | Payer: Medicare Other | Source: Ambulatory Visit | Attending: Radiation Oncology | Admitting: Radiation Oncology

## 2015-12-25 VITALS — BP 117/66 | HR 61 | Temp 97.0°F | Resp 18

## 2015-12-25 DIAGNOSIS — C155 Malignant neoplasm of lower third of esophagus: Secondary | ICD-10-CM

## 2015-12-25 DIAGNOSIS — Z5111 Encounter for antineoplastic chemotherapy: Secondary | ICD-10-CM | POA: Diagnosis not present

## 2015-12-25 DIAGNOSIS — C801 Malignant (primary) neoplasm, unspecified: Secondary | ICD-10-CM

## 2015-12-25 LAB — CBC WITH DIFFERENTIAL/PLATELET
BASOS ABS: 0 10*3/uL (ref 0–0.1)
Basophils Relative: 1 %
EOS PCT: 1 %
Eosinophils Absolute: 0 10*3/uL (ref 0–0.7)
HEMATOCRIT: 32 % — AB (ref 40.0–52.0)
Hemoglobin: 10.7 g/dL — ABNORMAL LOW (ref 13.0–18.0)
LYMPHS ABS: 0.3 10*3/uL — AB (ref 1.0–3.6)
LYMPHS PCT: 11 %
MCH: 33.3 pg (ref 26.0–34.0)
MCHC: 33.5 g/dL (ref 32.0–36.0)
MCV: 99.3 fL (ref 80.0–100.0)
MONO ABS: 0.2 10*3/uL (ref 0.2–1.0)
MONOS PCT: 8 %
NEUTROS ABS: 2.1 10*3/uL (ref 1.4–6.5)
Neutrophils Relative %: 79 %
Platelets: 109 10*3/uL — ABNORMAL LOW (ref 150–440)
RBC: 3.23 MIL/uL — ABNORMAL LOW (ref 4.40–5.90)
RDW: 13.6 % (ref 11.5–14.5)
WBC: 2.7 10*3/uL — ABNORMAL LOW (ref 3.8–10.6)

## 2015-12-25 LAB — COMPREHENSIVE METABOLIC PANEL
ALBUMIN: 3.1 g/dL — AB (ref 3.5–5.0)
ALT: 12 U/L — ABNORMAL LOW (ref 17–63)
ANION GAP: 5 (ref 5–15)
AST: 15 U/L (ref 15–41)
Alkaline Phosphatase: 73 U/L (ref 38–126)
BUN: 30 mg/dL — AB (ref 6–20)
CHLORIDE: 110 mmol/L (ref 101–111)
CO2: 20 mmol/L — AB (ref 22–32)
Calcium: 8.3 mg/dL — ABNORMAL LOW (ref 8.9–10.3)
Creatinine, Ser: 1.45 mg/dL — ABNORMAL HIGH (ref 0.61–1.24)
GFR calc Af Amer: 50 mL/min — ABNORMAL LOW (ref >60)
GFR calc non Af Amer: 43 mL/min — ABNORMAL LOW (ref >60)
GLUCOSE: 104 mg/dL — AB (ref 65–99)
POTASSIUM: 5 mmol/L (ref 3.5–5.1)
SODIUM: 135 mmol/L (ref 135–145)
TOTAL PROTEIN: 5.7 g/dL — AB (ref 6.5–8.1)
Total Bilirubin: 0.5 mg/dL (ref 0.3–1.2)

## 2015-12-25 MED ORDER — CEPHALEXIN 500 MG PO CAPS: 500.0000 mg | ORAL_CAPSULE | Freq: Three times a day (TID) | ORAL | 0 refills | Status: DC

## 2015-12-25 MED ORDER — HEPARIN SOD (PORK) LOCK FLUSH 100 UNIT/ML IV SOLN: 500.0000 [IU] | Freq: Once | INTRAVENOUS | Status: DC | PRN

## 2015-12-25 MED ORDER — SODIUM CHLORIDE 0.9 % IV SOLN
170.0000 mg | Freq: Once | INTRAVENOUS | Status: AC
  Administered 2015-12-25: 170 mg via INTRAVENOUS
  Filled 2015-12-25: qty 17

## 2015-12-25 MED ORDER — PACLITAXEL CHEMO INJECTION 300 MG/50ML
45.0000 mg/m2 | Freq: Once | INTRAVENOUS | Status: AC
  Administered 2015-12-25: 102 mg via INTRAVENOUS
  Filled 2015-12-25: qty 17

## 2015-12-25 MED ORDER — SODIUM CHLORIDE 0.9% FLUSH
10.0000 mL | Freq: Once | INTRAVENOUS | Status: AC
  Administered 2015-12-25: 10 mL via INTRAVENOUS
  Filled 2015-12-25: qty 10

## 2015-12-25 MED ORDER — SODIUM CHLORIDE 0.9 % IV SOLN
20.0000 mg | Freq: Once | INTRAVENOUS | Status: AC
  Administered 2015-12-25: 20 mg via INTRAVENOUS
  Filled 2015-12-25: qty 2

## 2015-12-25 MED ORDER — SODIUM CHLORIDE 0.9 % IV SOLN
Freq: Once | INTRAVENOUS | Status: AC
  Administered 2015-12-25: 12:00:00 via INTRAVENOUS
  Filled 2015-12-25: qty 1000

## 2015-12-25 MED ORDER — PALONOSETRON HCL INJECTION 0.25 MG/5ML
0.2500 mg | Freq: Once | INTRAVENOUS | Status: AC
  Administered 2015-12-25: 0.25 mg via INTRAVENOUS

## 2015-12-25 MED ORDER — HEPARIN SOD (PORK) LOCK FLUSH 100 UNIT/ML IV SOLN
500.0000 [IU] | Freq: Once | INTRAVENOUS | Status: AC
  Administered 2015-12-25: 500 [IU] via INTRAVENOUS

## 2015-12-25 MED ORDER — DIPHENHYDRAMINE HCL 50 MG/ML IJ SOLN
25.0000 mg | Freq: Once | INTRAMUSCULAR | Status: AC
  Administered 2015-12-25: 25 mg via INTRAVENOUS

## 2015-12-25 MED ORDER — FAMOTIDINE IN NACL 20-0.9 MG/50ML-% IV SOLN
20.0000 mg | Freq: Once | INTRAVENOUS | Status: AC
  Administered 2015-12-25: 20 mg via INTRAVENOUS

## 2015-12-26 ENCOUNTER — Ambulatory Visit
Admission: RE | Admit: 2015-12-26 | Discharge: 2015-12-26 | Disposition: A | Discharge status: Home / Self Care | Payer: Medicare Other | Source: Ambulatory Visit | Attending: Radiation Oncology | Admitting: Radiation Oncology

## 2015-12-26 DIAGNOSIS — C155 Malignant neoplasm of lower third of esophagus: Secondary | ICD-10-CM | POA: Diagnosis not present

## 2015-12-27 ENCOUNTER — Ambulatory Visit
Admission: RE | Admit: 2015-12-27 | Discharge: 2015-12-27 | Disposition: A | Discharge status: Home / Self Care | Payer: Medicare Other | Source: Ambulatory Visit | Attending: Radiation Oncology | Admitting: Radiation Oncology

## 2015-12-27 DIAGNOSIS — C155 Malignant neoplasm of lower third of esophagus: Secondary | ICD-10-CM | POA: Diagnosis not present

## 2015-12-28 ENCOUNTER — Ambulatory Visit
Admission: RE | Admit: 2015-12-28 | Discharge: 2015-12-28 | Disposition: A | Discharge status: Home / Self Care | Payer: Medicare Other | Source: Ambulatory Visit | Attending: Radiation Oncology | Admitting: Radiation Oncology

## 2015-12-28 DIAGNOSIS — C155 Malignant neoplasm of lower third of esophagus: Secondary | ICD-10-CM | POA: Diagnosis not present

## 2015-12-29 ENCOUNTER — Ambulatory Visit: Payer: Medicare Other

## 2015-12-29 ENCOUNTER — Ambulatory Visit
Admission: RE | Admit: 2015-12-29 | Discharge: 2015-12-29 | Disposition: A | Discharge status: Home / Self Care | Payer: Medicare Other | Source: Ambulatory Visit | Attending: Radiation Oncology | Admitting: Radiation Oncology

## 2015-12-29 DIAGNOSIS — C155 Malignant neoplasm of lower third of esophagus: Secondary | ICD-10-CM | POA: Diagnosis not present

## 2016-01-01 ENCOUNTER — Inpatient Hospital Stay: Payer: Medicare Other

## 2016-01-01 ENCOUNTER — Ambulatory Visit
Admission: RE | Admit: 2016-01-01 | Discharge: 2016-01-01 | Disposition: A | Discharge status: Home / Self Care | Payer: Medicare Other | Source: Ambulatory Visit | Attending: Radiation Oncology | Admitting: Radiation Oncology

## 2016-01-01 ENCOUNTER — Inpatient Hospital Stay (HOSPITAL_BASED_OUTPATIENT_CLINIC_OR_DEPARTMENT_OTHER): Payer: Medicare Other | Admitting: Internal Medicine

## 2016-01-01 VITALS — BP 118/63 | HR 69 | Temp 97.2°F | Resp 18 | Wt 205.2 lb

## 2016-01-01 DIAGNOSIS — C155 Malignant neoplasm of lower third of esophagus: Secondary | ICD-10-CM

## 2016-01-01 DIAGNOSIS — Z79899 Other long term (current) drug therapy: Secondary | ICD-10-CM | POA: Diagnosis not present

## 2016-01-01 DIAGNOSIS — N189 Chronic kidney disease, unspecified: Secondary | ICD-10-CM | POA: Diagnosis not present

## 2016-01-01 DIAGNOSIS — C801 Malignant (primary) neoplasm, unspecified: Secondary | ICD-10-CM

## 2016-01-01 DIAGNOSIS — Z5111 Encounter for antineoplastic chemotherapy: Secondary | ICD-10-CM | POA: Diagnosis not present

## 2016-01-01 DIAGNOSIS — I129 Hypertensive chronic kidney disease with stage 1 through stage 4 chronic kidney disease, or unspecified chronic kidney disease: Secondary | ICD-10-CM | POA: Diagnosis not present

## 2016-01-01 LAB — CBC WITH DIFFERENTIAL/PLATELET
Basophils Absolute: 0 10*3/uL (ref 0–0.1)
EOS ABS: 0 10*3/uL (ref 0–0.7)
HCT: 30.5 % — ABNORMAL LOW (ref 40.0–52.0)
Hemoglobin: 10.8 g/dL — ABNORMAL LOW (ref 13.0–18.0)
Lymphocytes Relative: 11 %
Lymphs Abs: 0.3 10*3/uL — ABNORMAL LOW (ref 1.0–3.6)
MCH: 34.5 pg — ABNORMAL HIGH (ref 26.0–34.0)
MCHC: 35.3 g/dL (ref 32.0–36.0)
MCV: 97.8 fL (ref 80.0–100.0)
MONO ABS: 0.2 10*3/uL (ref 0.2–1.0)
Neutro Abs: 2 10*3/uL (ref 1.4–6.5)
PLATELETS: 81 10*3/uL — AB (ref 150–440)
RBC: 3.12 MIL/uL — ABNORMAL LOW (ref 4.40–5.90)
RDW: 13.3 % (ref 11.5–14.5)
WBC: 2.5 10*3/uL — ABNORMAL LOW (ref 3.8–10.6)

## 2016-01-01 LAB — COMPREHENSIVE METABOLIC PANEL
ALBUMIN: 3.3 g/dL — AB (ref 3.5–5.0)
ALT: 14 U/L — ABNORMAL LOW (ref 17–63)
ANION GAP: 5 (ref 5–15)
AST: 15 U/L (ref 15–41)
Alkaline Phosphatase: 74 U/L (ref 38–126)
BUN: 33 mg/dL — ABNORMAL HIGH (ref 6–20)
CO2: 21 mmol/L — AB (ref 22–32)
Calcium: 8 mg/dL — ABNORMAL LOW (ref 8.9–10.3)
Chloride: 109 mmol/L (ref 101–111)
Creatinine, Ser: 1.3 mg/dL — ABNORMAL HIGH (ref 0.61–1.24)
GFR calc Af Amer: 57 mL/min — ABNORMAL LOW (ref >60)
GFR calc non Af Amer: 49 mL/min — ABNORMAL LOW (ref >60)
GLUCOSE: 91 mg/dL (ref 65–99)
POTASSIUM: 4.7 mmol/L (ref 3.5–5.1)
Sodium: 135 mmol/L (ref 135–145)
TOTAL PROTEIN: 5.8 g/dL — AB (ref 6.5–8.1)
Total Bilirubin: 0.6 mg/dL (ref 0.3–1.2)

## 2016-01-01 MED ORDER — SODIUM CHLORIDE 0.9% FLUSH
10.0000 mL | INTRAVENOUS | Status: DC | PRN
  Administered 2016-01-01: 10 mL via INTRAVENOUS
  Filled 2016-01-01: qty 10

## 2016-01-01 MED ORDER — HEPARIN SOD (PORK) LOCK FLUSH 100 UNIT/ML IV SOLN
INTRAVENOUS | Status: AC
  Filled 2016-01-01: qty 5

## 2016-01-01 MED ORDER — HEPARIN SOD (PORK) LOCK FLUSH 100 UNIT/ML IV SOLN
500.0000 [IU] | Freq: Once | INTRAVENOUS | Status: AC
  Administered 2016-01-01: 500 [IU] via INTRAVENOUS

## 2016-01-01 NOTE — Progress Notes (Signed)
[place] OFFICE PROGRESS NOTE  Patient Care Team: Chesley Noon, MD as PCP - General (Family Medicine)  No matching staging information was found for the patient.   Oncology History   # June 2017- Localized [? Stage II; no EUS]Distal Esophagus Adeno CA [Bx Dr.Elliot]; PET- no distant Mets; July 5th 2017-concurrent Carbo-Taxol- RT  # LLL nodule- monitor for now [PET June 2017]   # CKD [creat 1.4-1.7]; Hx of stroke [facility] 2016]  June 2017-MOLECULAR STUDIES- Her 2- IHC-NEG; MMR-12/18/2015- ordered.      Esophageal cancer ([place])   11/10/2015 Initial Diagnosis    Esophageal cancer (HCC)- distal esophagus- non-circumferential partially obstructing ulcerating mass- biopsy positive for mod diff  adenocarcinoma [Dr.Elliot]      Malignant neoplasm of lower third of esophagus ([place])   12/18/2015 Initial Diagnosis    Malignant neoplasm of lower third of esophagus (HCC)        INTERVAL HISTORY:  Marcus [name] 82 y.o.  male pleasant patient above history of Locally advanced esophageal cancer- currently on definitive chemoradiation is here for follow-up. Patient is currently on weekly carbotaxol with radiation.  Patient admits nosebleeds over the last 2 weeks. Intermittent at least 2-3 episodes. Resolving conservatively.   No nausea no vomiting. No difficulty swallowing. Chronic mild swelling in the legs. No tingling or numbness. Skin lesions are improving. Finished antibiotics.  REVIEW OF SYSTEMS:  A complete 10 point review of system is done which is negative except mentioned above/history of present illness.   PAST MEDICAL HISTORY :  Past Medical History:  Diagnosis Date  . Aphakia of right eye   . Arthritis   . Cancer ([place])    SKIN  . Enlarged prostate without lower urinary tract symptoms (luts)   . Esophageal cancer ([place])   . GERD (gastroesophageal reflux disease)   . Glaucoma   . Gout   . History of CVA (cerebrovascular accident)    Cerebral infarction  Nov. 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 History of Doppler ultrasound    dopplers revealed mild left ICA stenosis which has remained stable, occluded left subclavian artery with retrograde left vertebral filling and moderately severe right subclavian artery stenosis. he has no symptoms of upper extermity claudication or subclavian steal symptoms.   . History of DVT (deep vein thrombosis)    right arm  . History of stress test 09/02/2009   abnormal myocardial perfusion scan demonstrating an attenuation defect in the inferior region of the myocardium. No ischemia or infarct/scar is seen in the remaining myocardium. No prior study available for comparison. Abnormal myocardial study EF 79%  . Hyperlipemia   . Hypertension   . Macular degeneration   . Stroke ([place])   . Subclavian artery stenosis (HCC)     PAST SURGICAL HISTORY :   Past Surgical History:  Procedure Laterality Date  . CATARACT EXTRACTION    . COLONOSCOPY  2002  . ESOPHAGOGASTRODUODENOSCOPY (EGD) WITH PROPOFOL N/A 11/10/2015   Procedure: ESOPHAGOGASTRODUODENOSCOPY (EGD) WITH PROPOFOL;  Surgeon: Manya Silvas, MD;  Location: [facility] ENDOSCOPY;  Service: Endoscopy;  Laterality: N/A;  . FRACTURE SURGERY    . MOHS SURGERY    . PERIPHERAL VASCULAR CATHETERIZATION N/A 11/29/2015   Procedure: Glori Luis Cath Insertion;  Surgeon: Algernon Huxley, MD;  Location: [place] CV LAB;  Service: Cardiovascular;  Laterality: N/A;    FAMILY HISTORY :   Family History  Problem Relation Age of Onset  . Heart disease Brother   .  Cancer - Colon Sister   . Cancer Sister   . Other Brother     CABG    SOCIAL HISTORY:   Social History  Substance Use Topics  . Smoking status: Former Smoker    Packs/day: 1.00    Years: 40.00    Types: Cigarettes    Quit date: 06/03/1993  . Smokeless tobacco: Never Used  . Alcohol use No    ALLERGIES:  is allergic to codeine; allopurinol; simvastatin; and uloric [febuxostat].  MEDICATIONS:   Current Outpatient Prescriptions  Medication Sig Dispense Refill  . atorvastatin (LIPITOR) 20 MG tablet Take 1 tablet (20 mg total) by mouth daily at 6 PM. 30 tablet 1  . cephALEXin (KEFLEX) 500 MG capsule Take 1 capsule (500 mg total) by mouth 3 (three) times daily. 30 capsule 0  . clopidogrel (PLAVIX) 75 MG tablet Take 1 tablet (75 mg total) by mouth daily. 30 tablet 1  . Coenzyme Q10 (COQ10) 100 MG CAPS Take by mouth. Reported on 11/10/2015    . cyanocobalamin 500 MCG tablet Take 500 mcg by mouth daily.    Marland Kitchen dexamethasone (DECADRON) 4 MG tablet Take 2 tablets (8 mg total) by mouth daily. Start the day after chemotherapy for 2 days. 30 tablet 1  . dorzolamide (TRUSOPT) 2 % ophthalmic solution Place 1 drop into both eyes 2 (two) times daily.    Marland Kitchen doxazosin (CARDURA) 2 MG tablet Take 2 mg by mouth daily.    . finasteride (PROSCAR) 5 MG tablet Take 5 mg by mouth daily.  10  . lansoprazole (PREVACID) 30 MG capsule Take 1 capsule (30 mg total) by mouth daily at 12 noon. 30 capsule 11  . latanoprost (XALATAN) 0.005 % ophthalmic solution Place 1 drop into both eyes at bedtime.    . lidocaine-prilocaine (EMLA) cream Apply 1 application topically as needed. Apply generously over the Mediport 45 minutes prior to chemotherapy. 30 g 0  . lidocaine-prilocaine (EMLA) cream Apply to affected area once 30 g 3  . magnesium oxide (MAG-OX) 400 MG tablet Take 400 mg by mouth daily.    . metoprolol succinate (TOPROL-XL) 25 MG 24 hr tablet TAKE ONE TABLET (25 MG TOTAL) BY MOUTH DAILY.    Marland Kitchen ondansetron (ZOFRAN) 8 MG tablet Take 1 tablet (8 mg total) by mouth every 8 (eight) hours as needed for nausea or vomiting (start 3 days; after chemo). 40 tablet 1  . ondansetron (ZOFRAN) 8 MG tablet Take 1 tablet (8 mg total) by mouth 2 (two) times daily as needed for refractory nausea / vomiting. Start on day 3 after chemo. 30 tablet 1  . prochlorperazine (COMPAZINE) 10 MG tablet Take 1 tablet (10 mg total) by mouth every 6  (six) hours as needed for nausea or vomiting. 40 tablet 1  . prochlorperazine (COMPAZINE) 10 MG tablet Take 1 tablet (10 mg total) by mouth every 6 (six) hours as needed (Nausea or vomiting). 30 tablet 1  . RA LANSOPRAZOLE 15 MG capsule take 2 capsules by mouth once daily AT 12 NOON  1  . terazosin (HYTRIN) 10 MG capsule Take 10 mg by mouth at bedtime.    . timolol (TIMOPTIC) 0.5 % ophthalmic solution Place 1 drop into both eyes 2 (two) times daily.  3  . topiramate (TOPAMAX) 25 MG tablet Take 25 mg by mouth 2 (two) times daily.  0  . Vitamin D, Ergocalciferol, (DRISDOL) 50000 UNITS CAPS Take 50,000 Units by mouth every 7 (seven) days.     No  current facility-administered medications for this visit.     PHYSICAL EXAMINATION: ECOG PERFORMANCE STATUS: 2 - Symptomatic, <50% confined to bed  BP 118/63 (BP Location: Left Arm, Patient Position: Sitting)   Pulse 69   Temp 97.2 F (36.2 C) (Tympanic)   Resp 18   Wt 205 lb 3 oz (93.1 kg)   BMI 26.34 kg/m   Filed Weights   01/01/16 1112  Weight: 205 lb 3 oz (93.1 kg)    GENERAL: Well-nourished well-developed; Alert, no distress and comfortable.  Accompanied by his wife. He walks with a cane. EYES: no pallor or icterus OROPHARYNX: no thrush or ulceration;   NECK: supple, no masses felt LYMPH:  no palpable lymphadenopathy in the cervical, axillary or inguinal regions LUNGS: clear to auscultation and  No wheeze or crackles HEART/CVS: regular rate & rhythm and no murmurs; No lower extremity edema ABDOMEN:abdomen soft, non-tender and normal bowel sounds Musculoskeletal:no cyanosis of digits and no clubbing  PSYCH: alert & oriented x 3 with fluent speech NEURO: no focal motor/sensory deficits SKIN:  no rashes; scabbing lesions noted chest upper extremity and left lower extremity.  LABORATORY DATA:  I have reviewed the data as listed    Component Value Date/Time   NA 135 01/01/2016 1026   K 4.7 01/01/2016 1026   CL 109 01/01/2016 1026    CO2 21 (L) 01/01/2016 1026   GLUCOSE 91 01/01/2016 1026   BUN 33 (H) 01/01/2016 1026   CREATININE 1.30 (H) 01/01/2016 1026   CALCIUM 8.0 (L) 01/01/2016 1026   PROT 5.8 (L) 01/01/2016 1026   ALBUMIN 3.3 (L) 01/01/2016 1026   AST 15 01/01/2016 1026   ALT 14 (L) 01/01/2016 1026   ALKPHOS 74 01/01/2016 1026   BILITOT 0.6 01/01/2016 1026   GFRNONAA 49 (L) 01/01/2016 1026   GFRAA 57 (L) 01/01/2016 1026    No results found for: SPEP, UPEP  Lab Results  Component Value Date   WBC 2.5 (L) 01/01/2016   NEUTROABS 2.0 01/01/2016   HGB 10.8 (L) 01/01/2016   HCT 30.5 (L) 01/01/2016   MCV 97.8 01/01/2016   PLT 81 (L) 01/01/2016      Chemistry      Component Value Date/Time   NA 135 01/01/2016 1026   K 4.7 01/01/2016 1026   CL 109 01/01/2016 1026   CO2 21 (L) 01/01/2016 1026   BUN 33 (H) 01/01/2016 1026   CREATININE 1.30 (H) 01/01/2016 1026      Component Value Date/Time   CALCIUM 8.0 (L) 01/01/2016 1026   ALKPHOS 74 01/01/2016 1026   AST 15 01/01/2016 1026   ALT 14 (L) 01/01/2016 1026   BILITOT 0.6 01/01/2016 1026       RADIOGRAPHIC STUDIES: I have personally reviewed the radiological images as listed and agreed with the findings in the report. No results found.   ASSESSMENT & PLAN:  Malignant neoplasm of lower third of esophagus (HCC) Localized esophageal cancer; no evidence of any local adenopathy. On concurent definitive chemo [July 3rd]-RT  [July 5th]. Patient tolerating chemotherapy very well [RT until Aug 15th per pt].  # Labs reviewed- platelets 81[ see discusson below] HOLD chemotherapy today. Again reminded the patient that he will need consolidation chemotherapy approximately 2-3 weeks post chemoradiation. Likely plan 5-FU   # Nose bleeds/ on plavix [hx of stroke in dec 2016]- recommend saline spray/ HOLD chemo.   # CKD- creat 1.3- stable.   # weekly carbo-taxol/labs/ follow with me in 2 weeks.   No orders  of the defined types were placed in this  encounter.  All questions were answered. The patient knows to call the clinic with any problems, questions or concerns.      Cammie Sickle, MD 01/01/2016 6:05 PM

## 2016-01-01 NOTE — Assessment & Plan Note (Signed)
Localized esophageal cancer; no evidence of any local adenopathy. On concurent definitive chemo [July 3rd]-RT  [July 5th]. Patient tolerating chemotherapy very well [RT until Aug 15th per pt].  # Labs reviewed- platelets 81[ see discusson below] HOLD chemotherapy today. Again reminded the patient that he will need consolidation chemotherapy approximately 2-3 weeks post chemoradiation. Likely plan 5-FU   # Nose bleeds/ on plavix [hx of stroke in dec 2016]- recommend saline spray/ HOLD chemo.   # CKD- creat 1.3- stable.   # weekly carbo-taxol/labs/ follow with me in 2 weeks.

## 2016-01-01 NOTE — Progress Notes (Signed)
Patient states he has been having some nosebleeds.  Also states he has some nausea at times.

## 2016-01-02 ENCOUNTER — Ambulatory Visit
Admission: RE | Admit: 2016-01-02 | Discharge: 2016-01-02 | Disposition: A | Discharge status: Home / Self Care | Payer: Medicare Other | Source: Ambulatory Visit | Attending: Radiation Oncology | Admitting: Radiation Oncology

## 2016-01-02 ENCOUNTER — Telehealth: Payer: Self-pay

## 2016-01-02 DIAGNOSIS — C155 Malignant neoplasm of lower third of esophagus: Secondary | ICD-10-CM | POA: Diagnosis not present

## 2016-01-02 NOTE — Telephone Encounter (Signed)
  Oncology Nurse Navigator Documentation  Navigator Location: [facility] (01/02/16 0900) Navigator Encounter Type: Telephone (01/02/16 0900) Telephone: Incoming Call;Appt Confirmation/Clarification (01/02/16 0900)                                        Time Spent with Patient: 15 (01/02/16 0900)   Received call from spouse regarding upcoming appts for treatment since he did not receive treatment yesterday. Appts for 8/7 provided.

## 2016-01-03 ENCOUNTER — Ambulatory Visit: Payer: Medicare Other

## 2016-01-04 ENCOUNTER — Ambulatory Visit
Admission: RE | Admit: 2016-01-04 | Discharge: 2016-01-04 | Disposition: A | Discharge status: Home / Self Care | Payer: Medicare Other | Source: Ambulatory Visit | Attending: Radiation Oncology | Admitting: Radiation Oncology

## 2016-01-04 DIAGNOSIS — C155 Malignant neoplasm of lower third of esophagus: Secondary | ICD-10-CM | POA: Diagnosis not present

## 2016-01-05 ENCOUNTER — Other Ambulatory Visit: Payer: Self-pay | Admitting: *Deleted

## 2016-01-05 ENCOUNTER — Inpatient Hospital Stay: Payer: Medicare Other | Attending: Radiation Oncology

## 2016-01-05 ENCOUNTER — Ambulatory Visit
Admission: RE | Admit: 2016-01-05 | Discharge: 2016-01-05 | Disposition: A | Discharge status: Home / Self Care | Payer: Medicare Other | Source: Ambulatory Visit | Attending: Radiation Oncology | Admitting: Radiation Oncology

## 2016-01-05 DIAGNOSIS — Z8673 Personal history of transient ischemic attack (TIA), and cerebral infarction without residual deficits: Secondary | ICD-10-CM | POA: Insufficient documentation

## 2016-01-05 DIAGNOSIS — N189 Chronic kidney disease, unspecified: Secondary | ICD-10-CM | POA: Diagnosis not present

## 2016-01-05 DIAGNOSIS — H3530 Unspecified macular degeneration: Secondary | ICD-10-CM | POA: Insufficient documentation

## 2016-01-05 DIAGNOSIS — Z5111 Encounter for antineoplastic chemotherapy: Secondary | ICD-10-CM | POA: Insufficient documentation

## 2016-01-05 DIAGNOSIS — Z87891 Personal history of nicotine dependence: Secondary | ICD-10-CM | POA: Diagnosis not present

## 2016-01-05 DIAGNOSIS — M109 Gout, unspecified: Secondary | ICD-10-CM | POA: Diagnosis not present

## 2016-01-05 DIAGNOSIS — I129 Hypertensive chronic kidney disease with stage 1 through stage 4 chronic kidney disease, or unspecified chronic kidney disease: Secondary | ICD-10-CM | POA: Insufficient documentation

## 2016-01-05 DIAGNOSIS — Z86718 Personal history of other venous thrombosis and embolism: Secondary | ICD-10-CM | POA: Diagnosis not present

## 2016-01-05 DIAGNOSIS — R944 Abnormal results of kidney function studies: Secondary | ICD-10-CM | POA: Insufficient documentation

## 2016-01-05 DIAGNOSIS — H409 Unspecified glaucoma: Secondary | ICD-10-CM | POA: Insufficient documentation

## 2016-01-05 DIAGNOSIS — Z79899 Other long term (current) drug therapy: Secondary | ICD-10-CM | POA: Diagnosis not present

## 2016-01-05 DIAGNOSIS — C155 Malignant neoplasm of lower third of esophagus: Secondary | ICD-10-CM | POA: Diagnosis not present

## 2016-01-05 DIAGNOSIS — K219 Gastro-esophageal reflux disease without esophagitis: Secondary | ICD-10-CM | POA: Insufficient documentation

## 2016-01-05 DIAGNOSIS — E785 Hyperlipidemia, unspecified: Secondary | ICD-10-CM | POA: Diagnosis not present

## 2016-01-05 DIAGNOSIS — N400 Enlarged prostate without lower urinary tract symptoms: Secondary | ICD-10-CM | POA: Insufficient documentation

## 2016-01-05 DIAGNOSIS — M1990 Unspecified osteoarthritis, unspecified site: Secondary | ICD-10-CM | POA: Insufficient documentation

## 2016-01-05 LAB — COMPREHENSIVE METABOLIC PANEL
ALBUMIN: 3.4 g/dL — AB (ref 3.5–5.0)
ALK PHOS: 82 U/L (ref 38–126)
ALT: 15 U/L — ABNORMAL LOW (ref 17–63)
ANION GAP: 4 — AB (ref 5–15)
AST: 16 U/L (ref 15–41)
BILIRUBIN TOTAL: 0.6 mg/dL (ref 0.3–1.2)
BUN: 28 mg/dL — AB (ref 6–20)
CALCIUM: 8.5 mg/dL — AB (ref 8.9–10.3)
CO2: 23 mmol/L (ref 22–32)
Chloride: 109 mmol/L (ref 101–111)
Creatinine, Ser: 1.38 mg/dL — ABNORMAL HIGH (ref 0.61–1.24)
GFR calc Af Amer: 53 mL/min — ABNORMAL LOW (ref >60)
GFR calc non Af Amer: 46 mL/min — ABNORMAL LOW (ref >60)
GLUCOSE: 102 mg/dL — AB (ref 65–99)
Potassium: 4.6 mmol/L (ref 3.5–5.1)
Sodium: 136 mmol/L (ref 135–145)
TOTAL PROTEIN: 6 g/dL — AB (ref 6.5–8.1)

## 2016-01-05 LAB — CBC WITH DIFFERENTIAL/PLATELET
BASOS ABS: 0 10*3/uL (ref 0–0.1)
Eosinophils Absolute: 0 10*3/uL (ref 0–0.7)
HCT: 30.2 % — ABNORMAL LOW (ref 40.0–52.0)
Hemoglobin: 10.6 g/dL — ABNORMAL LOW (ref 13.0–18.0)
Lymphocytes Relative: 10 %
Lymphs Abs: 0.2 10*3/uL — ABNORMAL LOW (ref 1.0–3.6)
MCH: 34.4 pg — ABNORMAL HIGH (ref 26.0–34.0)
MCHC: 34.9 g/dL (ref 32.0–36.0)
MCV: 98.5 fL (ref 80.0–100.0)
MONO ABS: 0.5 10*3/uL (ref 0.2–1.0)
Monocytes Relative: 20 %
Neutro Abs: 1.7 10*3/uL (ref 1.4–6.5)
Neutrophils Relative %: 70 %
PLATELETS: 84 10*3/uL — AB (ref 150–440)
RBC: 3.07 MIL/uL — ABNORMAL LOW (ref 4.40–5.90)
RDW: 14.1 % (ref 11.5–14.5)
WBC: 2.5 10*3/uL — ABNORMAL LOW (ref 3.8–10.6)

## 2016-01-08 ENCOUNTER — Other Ambulatory Visit: Payer: Self-pay | Admitting: Internal Medicine

## 2016-01-08 ENCOUNTER — Inpatient Hospital Stay: Payer: Medicare Other

## 2016-01-08 ENCOUNTER — Ambulatory Visit
Admission: RE | Admit: 2016-01-08 | Discharge: 2016-01-08 | Disposition: A | Discharge status: Home / Self Care | Payer: Medicare Other | Source: Ambulatory Visit | Attending: Radiation Oncology | Admitting: Radiation Oncology

## 2016-01-08 VITALS — BP 145/62 | HR 62 | Temp 97.1°F | Resp 18

## 2016-01-08 DIAGNOSIS — Z5111 Encounter for antineoplastic chemotherapy: Secondary | ICD-10-CM | POA: Diagnosis not present

## 2016-01-08 DIAGNOSIS — C155 Malignant neoplasm of lower third of esophagus: Secondary | ICD-10-CM | POA: Diagnosis not present

## 2016-01-08 LAB — CBC WITH DIFFERENTIAL/PLATELET
BASOS ABS: 0 10*3/uL (ref 0–0.1)
EOS ABS: 0 10*3/uL (ref 0–0.7)
HCT: 29.2 % — ABNORMAL LOW (ref 40.0–52.0)
Hemoglobin: 10.2 g/dL — ABNORMAL LOW (ref 13.0–18.0)
Lymphocytes Relative: 9 %
Lymphs Abs: 0.3 10*3/uL — ABNORMAL LOW (ref 1.0–3.6)
MCH: 34.6 pg — ABNORMAL HIGH (ref 26.0–34.0)
MCHC: 34.9 g/dL (ref 32.0–36.0)
MCV: 98.9 fL (ref 80.0–100.0)
MONO ABS: 0.5 10*3/uL (ref 0.2–1.0)
Monocytes Relative: 20 %
Neutro Abs: 1.9 10*3/uL (ref 1.4–6.5)
Neutrophils Relative %: 71 %
PLATELETS: 85 10*3/uL — AB (ref 150–440)
RBC: 2.95 MIL/uL — AB (ref 4.40–5.90)
RDW: 14.2 % (ref 11.5–14.5)
WBC: 2.7 10*3/uL — AB (ref 3.8–10.6)

## 2016-01-08 LAB — COMPREHENSIVE METABOLIC PANEL
ALK PHOS: 93 U/L (ref 38–126)
ALT: 14 U/L — ABNORMAL LOW (ref 17–63)
ANION GAP: 5 (ref 5–15)
AST: 15 U/L (ref 15–41)
Albumin: 3.2 g/dL — ABNORMAL LOW (ref 3.5–5.0)
BILIRUBIN TOTAL: 0.6 mg/dL (ref 0.3–1.2)
BUN: 25 mg/dL — ABNORMAL HIGH (ref 6–20)
CALCIUM: 8 mg/dL — AB (ref 8.9–10.3)
CO2: 22 mmol/L (ref 22–32)
CREATININE: 1.39 mg/dL — AB (ref 0.61–1.24)
Chloride: 109 mmol/L (ref 101–111)
GFR calc non Af Amer: 46 mL/min — ABNORMAL LOW (ref >60)
GFR, EST AFRICAN AMERICAN: 53 mL/min — AB (ref >60)
GLUCOSE: 104 mg/dL — AB (ref 65–99)
Potassium: 4.4 mmol/L (ref 3.5–5.1)
Sodium: 136 mmol/L (ref 135–145)
TOTAL PROTEIN: 5.9 g/dL — AB (ref 6.5–8.1)

## 2016-01-08 MED ORDER — PACLITAXEL CHEMO INJECTION 300 MG/50ML
45.0000 mg/m2 | Freq: Once | INTRAVENOUS | Status: AC
  Administered 2016-01-08: 102 mg via INTRAVENOUS
  Filled 2016-01-08: qty 17

## 2016-01-08 MED ORDER — SODIUM CHLORIDE 0.9 % IV SOLN
20.0000 mg | Freq: Once | INTRAVENOUS | Status: AC
  Administered 2016-01-08: 20 mg via INTRAVENOUS
  Filled 2016-01-08: qty 2

## 2016-01-08 MED ORDER — SODIUM CHLORIDE 0.9% FLUSH
10.0000 mL | INTRAVENOUS | Status: DC | PRN
  Filled 2016-01-08: qty 10

## 2016-01-08 MED ORDER — HEPARIN SOD (PORK) LOCK FLUSH 100 UNIT/ML IV SOLN
500.0000 [IU] | Freq: Once | INTRAVENOUS | Status: AC | PRN
  Administered 2016-01-08: 500 [IU]
  Filled 2016-01-08: qty 5

## 2016-01-08 MED ORDER — SODIUM CHLORIDE 0.9 % IV SOLN
Freq: Once | INTRAVENOUS | Status: AC
Start: 2016-01-08 — End: 2016-01-08
  Administered 2016-01-08: 11:00:00 via INTRAVENOUS
  Filled 2016-01-08: qty 1000

## 2016-01-08 MED ORDER — PALONOSETRON HCL INJECTION 0.25 MG/5ML
0.2500 mg | Freq: Once | INTRAVENOUS | Status: AC
  Administered 2016-01-08: 0.25 mg via INTRAVENOUS
  Filled 2016-01-08: qty 5

## 2016-01-08 MED ORDER — FAMOTIDINE IN NACL 20-0.9 MG/50ML-% IV SOLN
20.0000 mg | Freq: Once | INTRAVENOUS | Status: AC
Start: 2016-01-08 — End: 2016-01-08
  Administered 2016-01-08: 20 mg via INTRAVENOUS
  Filled 2016-01-08: qty 50

## 2016-01-08 MED ORDER — SODIUM CHLORIDE 0.9 % IV SOLN
170.0000 mg | Freq: Once | INTRAVENOUS | Status: AC
  Administered 2016-01-08: 170 mg via INTRAVENOUS
  Filled 2016-01-08: qty 17

## 2016-01-08 MED ORDER — DIPHENHYDRAMINE HCL 50 MG/ML IJ SOLN
25.0000 mg | Freq: Once | INTRAMUSCULAR | Status: AC
  Administered 2016-01-08: 25 mg via INTRAVENOUS
  Filled 2016-01-08: qty 1

## 2016-01-08 NOTE — Progress Notes (Signed)
Pt lab results today: BUN 25, Cr 1.39, Alt 14 and Plt 85, ok to proceed with tx today per Dr Rogue Bussing.

## 2016-01-08 NOTE — Progress Notes (Signed)
Plts 84. Per MD will continue treatment at this time.

## 2016-01-09 ENCOUNTER — Ambulatory Visit
Admission: RE | Admit: 2016-01-09 | Discharge: 2016-01-09 | Disposition: A | Discharge status: Home / Self Care | Payer: Medicare Other | Source: Ambulatory Visit | Attending: Radiation Oncology | Admitting: Radiation Oncology

## 2016-01-09 DIAGNOSIS — C155 Malignant neoplasm of lower third of esophagus: Secondary | ICD-10-CM | POA: Diagnosis not present

## 2016-01-10 ENCOUNTER — Ambulatory Visit
Admission: RE | Admit: 2016-01-10 | Discharge: 2016-01-10 | Disposition: A | Discharge status: Home / Self Care | Payer: Medicare Other | Source: Ambulatory Visit | Attending: Radiation Oncology | Admitting: Radiation Oncology

## 2016-01-10 DIAGNOSIS — C155 Malignant neoplasm of lower third of esophagus: Secondary | ICD-10-CM | POA: Diagnosis not present

## 2016-01-11 ENCOUNTER — Ambulatory Visit
Admission: RE | Admit: 2016-01-11 | Discharge: 2016-01-11 | Disposition: A | Discharge status: Home / Self Care | Payer: Medicare Other | Source: Ambulatory Visit | Attending: Radiation Oncology | Admitting: Radiation Oncology

## 2016-01-11 DIAGNOSIS — C155 Malignant neoplasm of lower third of esophagus: Secondary | ICD-10-CM | POA: Diagnosis not present

## 2016-01-12 ENCOUNTER — Ambulatory Visit
Admission: RE | Admit: 2016-01-12 | Discharge: 2016-01-12 | Disposition: A | Discharge status: Home / Self Care | Payer: Medicare Other | Source: Ambulatory Visit | Attending: Radiation Oncology | Admitting: Radiation Oncology

## 2016-01-12 DIAGNOSIS — C155 Malignant neoplasm of lower third of esophagus: Secondary | ICD-10-CM | POA: Diagnosis not present

## 2016-01-15 ENCOUNTER — Inpatient Hospital Stay (HOSPITAL_BASED_OUTPATIENT_CLINIC_OR_DEPARTMENT_OTHER): Payer: Medicare Other | Admitting: Internal Medicine

## 2016-01-15 ENCOUNTER — Inpatient Hospital Stay: Payer: Medicare Other

## 2016-01-15 ENCOUNTER — Ambulatory Visit
Admission: RE | Admit: 2016-01-15 | Discharge: 2016-01-15 | Disposition: A | Discharge status: Home / Self Care | Payer: Medicare Other | Source: Ambulatory Visit | Attending: Radiation Oncology | Admitting: Radiation Oncology

## 2016-01-15 VITALS — BP 112/62 | HR 75 | Temp 97.1°F | Resp 18 | Wt 202.2 lb

## 2016-01-15 DIAGNOSIS — R944 Abnormal results of kidney function studies: Secondary | ICD-10-CM | POA: Diagnosis not present

## 2016-01-15 DIAGNOSIS — C155 Malignant neoplasm of lower third of esophagus: Secondary | ICD-10-CM

## 2016-01-15 DIAGNOSIS — I129 Hypertensive chronic kidney disease with stage 1 through stage 4 chronic kidney disease, or unspecified chronic kidney disease: Secondary | ICD-10-CM

## 2016-01-15 DIAGNOSIS — Z5111 Encounter for antineoplastic chemotherapy: Secondary | ICD-10-CM | POA: Diagnosis not present

## 2016-01-15 DIAGNOSIS — Z79899 Other long term (current) drug therapy: Secondary | ICD-10-CM

## 2016-01-15 DIAGNOSIS — N189 Chronic kidney disease, unspecified: Secondary | ICD-10-CM

## 2016-01-15 LAB — COMPREHENSIVE METABOLIC PANEL
ALK PHOS: 84 U/L (ref 38–126)
ALT: 15 U/L — AB (ref 17–63)
AST: 16 U/L (ref 15–41)
Albumin: 3.3 g/dL — ABNORMAL LOW (ref 3.5–5.0)
Anion gap: 5 (ref 5–15)
BILIRUBIN TOTAL: 0.5 mg/dL (ref 0.3–1.2)
BUN: 33 mg/dL — AB (ref 6–20)
CALCIUM: 8.3 mg/dL — AB (ref 8.9–10.3)
CO2: 22 mmol/L (ref 22–32)
CREATININE: 1.2 mg/dL (ref 0.61–1.24)
Chloride: 110 mmol/L (ref 101–111)
GFR calc Af Amer: >60 mL/min (ref >60)
GFR, EST NON AFRICAN AMERICAN: 54 mL/min — AB (ref >60)
GLUCOSE: 113 mg/dL — AB (ref 65–99)
POTASSIUM: 4.8 mmol/L (ref 3.5–5.1)
Sodium: 137 mmol/L (ref 135–145)
TOTAL PROTEIN: 5.9 g/dL — AB (ref 6.5–8.1)

## 2016-01-15 LAB — CBC WITH DIFFERENTIAL/PLATELET
BASOS ABS: 0 10*3/uL (ref 0–0.1)
EOS ABS: 0 10*3/uL (ref 0–0.7)
HCT: 28.3 % — ABNORMAL LOW (ref 40.0–52.0)
Hemoglobin: 10.2 g/dL — ABNORMAL LOW (ref 13.0–18.0)
Lymphs Abs: 0.2 10*3/uL — ABNORMAL LOW (ref 1.0–3.6)
MCH: 34.9 pg — ABNORMAL HIGH (ref 26.0–34.0)
MCHC: 35.9 g/dL (ref 32.0–36.0)
MCV: 97.1 fL (ref 80.0–100.0)
MONO ABS: 0.1 10*3/uL — AB (ref 0.2–1.0)
Monocytes Relative: 5 %
Neutro Abs: 2.3 10*3/uL (ref 1.4–6.5)
Neutrophils Relative %: 87 %
PLATELETS: 90 10*3/uL — AB (ref 150–440)
RBC: 2.92 MIL/uL — AB (ref 4.40–5.90)
RDW: 13.9 % (ref 11.5–14.5)
WBC: 2.6 10*3/uL — AB (ref 3.8–10.6)

## 2016-01-15 MED ORDER — SODIUM CHLORIDE 0.9% FLUSH
10.0000 mL | Freq: Once | INTRAVENOUS | Status: AC
  Administered 2016-01-15: 10 mL via INTRAVENOUS
  Filled 2016-01-15: qty 10

## 2016-01-15 MED ORDER — DIPHENHYDRAMINE HCL 50 MG/ML IJ SOLN
25.0000 mg | Freq: Once | INTRAMUSCULAR | Status: AC
  Administered 2016-01-15: 25 mg via INTRAVENOUS
  Filled 2016-01-15: qty 1

## 2016-01-15 MED ORDER — SODIUM CHLORIDE 0.9 % IV SOLN
170.0000 mg | Freq: Once | INTRAVENOUS | Status: AC
  Administered 2016-01-15: 170 mg via INTRAVENOUS
  Filled 2016-01-15: qty 17

## 2016-01-15 MED ORDER — SODIUM CHLORIDE 0.9 % IV SOLN
20.0000 mg | Freq: Once | INTRAVENOUS | Status: AC
  Administered 2016-01-15: 20 mg via INTRAVENOUS
  Filled 2016-01-15: qty 2

## 2016-01-15 MED ORDER — PALONOSETRON HCL INJECTION 0.25 MG/5ML
0.2500 mg | Freq: Once | INTRAVENOUS | Status: AC
  Administered 2016-01-15: 0.25 mg via INTRAVENOUS
  Filled 2016-01-15: qty 5

## 2016-01-15 MED ORDER — SODIUM CHLORIDE 0.9 % IV SOLN
Freq: Once | INTRAVENOUS | Status: AC
  Administered 2016-01-15: 12:00:00 via INTRAVENOUS
  Filled 2016-01-15: qty 1000

## 2016-01-15 MED ORDER — HEPARIN SOD (PORK) LOCK FLUSH 100 UNIT/ML IV SOLN
500.0000 [IU] | Freq: Once | INTRAVENOUS | Status: AC
  Administered 2016-01-15: 500 [IU] via INTRAVENOUS
  Filled 2016-01-15: qty 5

## 2016-01-15 MED ORDER — FAMOTIDINE IN NACL 20-0.9 MG/50ML-% IV SOLN
20.0000 mg | Freq: Once | INTRAVENOUS | Status: AC
  Administered 2016-01-15: 20 mg via INTRAVENOUS
  Filled 2016-01-15: qty 50

## 2016-01-15 MED ORDER — PACLITAXEL CHEMO INJECTION 300 MG/50ML
45.0000 mg/m2 | Freq: Once | INTRAVENOUS | Status: AC
  Administered 2016-01-15: 102 mg via INTRAVENOUS
  Filled 2016-01-15: qty 17

## 2016-01-15 NOTE — Assessment & Plan Note (Signed)
Localized esophageal cancer; no evidence of any local adenopathy. On concurent definitive chemo [July 3rd]-RT  [July 5th]. Patient tolerating chemotherapy very well [RT until Aug 15th per pt].  # Labs reviewed- platelets 90; proceed with carbo-taxol today.    Again reminded the patient that he will need consolidation chemotherapy approximately 3 weeks [6th Sep] post chemoradiation. Infusional 5-FU   # Nose bleeds/ on plavix [hx of stroke in dec 2016]- recommend saline spray/ HOLD chemo.   # CKD- creat 1.3- stable.   # 5FU Infusion l/labs/ follow with me sep 6th.

## 2016-01-15 NOTE — Progress Notes (Signed)
[place] OFFICE PROGRESS NOTE  Patient Care Team: Chesley Noon, MD as PCP - General (Family Medicine)  No matching staging information was found for the patient.   Oncology History   # June 2017- Localized [? Stage II; no EUS]Distal Esophagus Adeno CA [Bx Dr.Elliot]; PET- no distant Mets; July 5th 2017-concurrent Carbo-Taxol- RT  # LLL nodule- monitor for now [PET June 2017]   # CKD [creat 1.4-1.7]; Hx of stroke [facility] 2016]  June 2017-MOLECULAR STUDIES- Her 2- IHC-NEG; MMR-12/18/2015- ordered.      Esophageal cancer ([place])   11/10/2015 Initial Diagnosis    Esophageal cancer (HCC)- distal esophagus- non-circumferential partially obstructing ulcerating mass- biopsy positive for mod diff  adenocarcinoma [Dr.Elliot]      Malignant neoplasm of lower third of esophagus ([place])   12/18/2015 Initial Diagnosis    Malignant neoplasm of lower third of esophagus (HCC)        INTERVAL HISTORY:  Marcus [name] 82 y.o.  male pleasant patient above history of Locally advanced esophageal cancer- currently on definitive chemoradiation is here for follow-up. Patient is currently on weekly carbotaxol with radiation. Patient will finish his radiation tomorrow.  No nausea no vomiting. No difficulty swallowing. Chronic mild swelling in the legs. No tingling or numbness. Skin lesions are improving. Finished antibiotics.  REVIEW OF SYSTEMS:  A complete 10 point review of system is done which is negative except mentioned above/history of present illness.   PAST MEDICAL HISTORY :  Past Medical History:  Diagnosis Date  . Aphakia of right eye   . Arthritis   . Cancer ([place])    SKIN  . Enlarged prostate without lower urinary tract symptoms (luts)   . Esophageal cancer ([place])   . GERD (gastroesophageal reflux disease)   . Glaucoma   . Gout   . History of CVA (cerebrovascular accident)    Cerebral infarction Nov. 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 History of Doppler  ultrasound    dopplers revealed mild left ICA stenosis which has remained stable, occluded left subclavian artery with retrograde left vertebral filling and moderately severe right subclavian artery stenosis. he has no symptoms of upper extermity claudication or subclavian steal symptoms.   . History of DVT (deep vein thrombosis)    right arm  . History of stress test 09/02/2009   abnormal myocardial perfusion scan demonstrating an attenuation defect in the inferior region of the myocardium. No ischemia or infarct/scar is seen in the remaining myocardium. No prior study available for comparison. Abnormal myocardial study EF 79%  . Hyperlipemia   . Hypertension   . Macular degeneration   . Stroke ([place])   . Subclavian artery stenosis (HCC)     PAST SURGICAL HISTORY :   Past Surgical History:  Procedure Laterality Date  . CATARACT EXTRACTION    . COLONOSCOPY  2002  . ESOPHAGOGASTRODUODENOSCOPY (EGD) WITH PROPOFOL N/A 11/10/2015   Procedure: ESOPHAGOGASTRODUODENOSCOPY (EGD) WITH PROPOFOL;  Surgeon: Manya Silvas, MD;  Location: [facility] ENDOSCOPY;  Service: Endoscopy;  Laterality: N/A;  . FRACTURE SURGERY    . MOHS SURGERY    . PERIPHERAL VASCULAR CATHETERIZATION N/A 11/29/2015   Procedure: Glori Luis Cath Insertion;  Surgeon: Algernon Huxley, MD;  Location: [place] CV LAB;  Service: Cardiovascular;  Laterality: N/A;    FAMILY HISTORY :   Family History  Problem Relation Age of Onset  . Heart disease Brother   . Cancer - Colon Sister   . Cancer Sister   .  Other Brother     CABG    SOCIAL HISTORY:   Social History  Substance Use Topics  . Smoking status: Former Smoker    Packs/day: 1.00    Years: 40.00    Types: Cigarettes    Quit date: 06/03/1993  . Smokeless tobacco: Never Used  . Alcohol use No    ALLERGIES:  is allergic to codeine; allopurinol; simvastatin; and uloric [febuxostat].  MEDICATIONS:  Current Outpatient Prescriptions  Medication Sig Dispense Refill  .  atorvastatin (LIPITOR) 20 MG tablet Take 1 tablet (20 mg total) by mouth daily at 6 PM. 30 tablet 1  . cephALEXin (KEFLEX) 500 MG capsule Take 1 capsule (500 mg total) by mouth 3 (three) times daily. 30 capsule 0  . clopidogrel (PLAVIX) 75 MG tablet Take 1 tablet (75 mg total) by mouth daily. 30 tablet 1  . Coenzyme Q10 (COQ10) 100 MG CAPS Take by mouth. Reported on 11/10/2015    . cyanocobalamin 500 MCG tablet Take 500 mcg by mouth daily.    Marland Kitchen dexamethasone (DECADRON) 4 MG tablet Take 2 tablets (8 mg total) by mouth daily. Start the day after chemotherapy for 2 days. 30 tablet 1  . dorzolamide (TRUSOPT) 2 % ophthalmic solution Place 1 drop into both eyes 2 (two) times daily.    Marland Kitchen doxazosin (CARDURA) 2 MG tablet Take 2 mg by mouth daily.    . finasteride (PROSCAR) 5 MG tablet Take 5 mg by mouth daily.  10  . lansoprazole (PREVACID) 30 MG capsule Take 1 capsule (30 mg total) by mouth daily at 12 noon. 30 capsule 11  . latanoprost (XALATAN) 0.005 % ophthalmic solution Place 1 drop into both eyes at bedtime.    . lidocaine-prilocaine (EMLA) cream Apply 1 application topically as needed. Apply generously over the Mediport 45 minutes prior to chemotherapy. 30 g 0  . lidocaine-prilocaine (EMLA) cream Apply to affected area once 30 g 3  . magnesium oxide (MAG-OX) 400 MG tablet Take 400 mg by mouth daily.    . metoprolol succinate (TOPROL-XL) 25 MG 24 hr tablet TAKE ONE TABLET (25 MG TOTAL) BY MOUTH DAILY.    Marland Kitchen ondansetron (ZOFRAN) 8 MG tablet Take 1 tablet (8 mg total) by mouth every 8 (eight) hours as needed for nausea or vomiting (start 3 days; after chemo). 40 tablet 1  . ondansetron (ZOFRAN) 8 MG tablet Take 1 tablet (8 mg total) by mouth 2 (two) times daily as needed for refractory nausea / vomiting. Start on day 3 after chemo. 30 tablet 1  . prochlorperazine (COMPAZINE) 10 MG tablet Take 1 tablet (10 mg total) by mouth every 6 (six) hours as needed for nausea or vomiting. 40 tablet 1  .  prochlorperazine (COMPAZINE) 10 MG tablet Take 1 tablet (10 mg total) by mouth every 6 (six) hours as needed (Nausea or vomiting). 30 tablet 1  . RA LANSOPRAZOLE 15 MG capsule take 2 capsules by mouth once daily AT 12 NOON  1  . terazosin (HYTRIN) 10 MG capsule Take 10 mg by mouth at bedtime.    . timolol (TIMOPTIC) 0.5 % ophthalmic solution Place 1 drop into both eyes 2 (two) times daily.  3  . topiramate (TOPAMAX) 25 MG tablet Take 25 mg by mouth 2 (two) times daily.  0  . Vitamin D, Ergocalciferol, (DRISDOL) 50000 UNITS CAPS Take 50,000 Units by mouth every 7 (seven) days.     No current facility-administered medications for this visit.     PHYSICAL EXAMINATION:  ECOG PERFORMANCE STATUS: 2 - Symptomatic, <50% confined to bed  BP 112/62 (BP Location: Right Arm, Patient Position: Sitting)   Pulse 75   Temp 97.1 F (36.2 C) (Tympanic)   Resp 18   Wt 202 lb 4 oz (91.7 kg)   BMI 25.97 kg/m   Filed Weights   01/15/16 1013  Weight: 202 lb 4 oz (91.7 kg)    GENERAL: Well-nourished well-developed; Alert, no distress and comfortable.  Accompanied by his wife. He walks with a cane. EYES: no pallor or icterus OROPHARYNX: no thrush or ulceration;   NECK: supple, no masses felt LYMPH:  no palpable lymphadenopathy in the cervical, axillary or inguinal regions LUNGS: clear to auscultation and  No wheeze or crackles HEART/CVS: regular rate & rhythm and no murmurs; No lower extremity edema ABDOMEN:abdomen soft, non-tender and normal bowel sounds Musculoskeletal:no cyanosis of digits and no clubbing  PSYCH: alert & oriented x 3 with fluent speech NEURO: no focal motor/sensory deficits SKIN:  no rashes; scabbing lesions noted chest upper extremity and left lower extremity.  LABORATORY DATA:  I have reviewed the data as listed    Component Value Date/Time   NA 137 01/15/2016 0937   K 4.8 01/15/2016 0937   CL 110 01/15/2016 0937   CO2 22 01/15/2016 0937   GLUCOSE 113 (H) 01/15/2016 0937    BUN 33 (H) 01/15/2016 0937   CREATININE 1.20 01/15/2016 0937   CALCIUM 8.3 (L) 01/15/2016 0937   PROT 5.9 (L) 01/15/2016 0937   ALBUMIN 3.3 (L) 01/15/2016 0937   AST 16 01/15/2016 0937   ALT 15 (L) 01/15/2016 0937   ALKPHOS 84 01/15/2016 0937   BILITOT 0.5 01/15/2016 0937   GFRNONAA 54 (L) 01/15/2016 0937   GFRAA >60 01/15/2016 0937    No results found for: SPEP, UPEP  Lab Results  Component Value Date   WBC 2.6 (L) 01/15/2016   NEUTROABS 2.3 01/15/2016   HGB 10.2 (L) 01/15/2016   HCT 28.3 (L) 01/15/2016   MCV 97.1 01/15/2016   PLT 90 (L) 01/15/2016      Chemistry      Component Value Date/Time   NA 137 01/15/2016 0937   K 4.8 01/15/2016 0937   CL 110 01/15/2016 0937   CO2 22 01/15/2016 0937   BUN 33 (H) 01/15/2016 0937   CREATININE 1.20 01/15/2016 0937      Component Value Date/Time   CALCIUM 8.3 (L) 01/15/2016 0937   ALKPHOS 84 01/15/2016 0937   AST 16 01/15/2016 0937   ALT 15 (L) 01/15/2016 0937   BILITOT 0.5 01/15/2016 0937       RADIOGRAPHIC STUDIES: I have personally reviewed the radiological images as listed and agreed with the findings in the report. No results found.   ASSESSMENT & PLAN:  Malignant neoplasm of lower third of esophagus (HCC) Localized esophageal cancer; no evidence of any local adenopathy. On concurent definitive chemo [July 3rd]-RT  [July 5th]. Patient tolerating chemotherapy very well [RT until Aug 15th per pt].  # Labs reviewed- platelets 90; proceed with carbo-taxol today.    Again reminded the patient that he will need consolidation chemotherapy approximately 3 weeks [6th Sep] post chemoradiation. Infusional 5-FU   # Nose bleeds/ on plavix [hx of stroke in dec 2016]- recommend saline spray/ HOLD chemo.   # CKD- creat 1.3- stable.   # 5FU Infusion l/labs/ follow with me sep 6th.    No orders of the defined types were placed in this encounter.  All questions were  answered. The patient knows to call the clinic with any  problems, questions or concerns.      Cammie Sickle, MD 01/16/2016 8:06 AM

## 2016-01-15 NOTE — Progress Notes (Signed)
Patient continues to have SOB on exertion.  Also c/o fatigue.

## 2016-01-16 ENCOUNTER — Ambulatory Visit
Admission: RE | Admit: 2016-01-16 | Discharge: 2016-01-16 | Disposition: A | Discharge status: Home / Self Care | Payer: Medicare Other | Source: Ambulatory Visit | Attending: Radiation Oncology | Admitting: Radiation Oncology

## 2016-01-16 ENCOUNTER — Telehealth: Payer: Self-pay

## 2016-01-16 ENCOUNTER — Ambulatory Visit: Payer: Medicare Other

## 2016-01-16 DIAGNOSIS — C155 Malignant neoplasm of lower third of esophagus: Secondary | ICD-10-CM | POA: Diagnosis not present

## 2016-01-16 NOTE — Telephone Encounter (Signed)
  Oncology Nurse Navigator Documentation  Navigator Location: [facility] (01/16/16 0800) Navigator Encounter Type: Telephone (01/16/16 0800) Telephone: Incoming Call (01/16/16 0800)                                        Time Spent with Patient: 15 (01/16/16 0800)   Received call from Marcus [name]. He is asking if he can reschedule last radiation appt to 9/6. He would like to go out of town after treatment today. Per Dr. Baruch Gouty 9/6 would bring no benefit and if he misses last appt it would be ok.

## 2016-01-17 ENCOUNTER — Ambulatory Visit: Payer: Medicare Other

## 2016-01-31 ENCOUNTER — Other Ambulatory Visit: Payer: Self-pay | Admitting: *Deleted

## 2016-01-31 ENCOUNTER — Telehealth: Payer: Self-pay

## 2016-01-31 DIAGNOSIS — C155 Malignant neoplasm of lower third of esophagus: Secondary | ICD-10-CM

## 2016-01-31 DIAGNOSIS — R1310 Dysphagia, unspecified: Secondary | ICD-10-CM

## 2016-01-31 MED ORDER — SUCRALFATE 1 GM/10ML PO SUSP: 1.0000 g | Freq: Three times a day (TID) | ORAL | 0 refills | Status: DC

## 2016-01-31 NOTE — Telephone Encounter (Signed)
  Oncology Nurse Navigator Documentation  Navigator Location: [facility] (01/31/16 1400) Navigator Encounter Type: Telephone (01/31/16 1400) Telephone: Incoming Call;Symptom Mgt (01/31/16 1400)                                        Time Spent with Patient: 15 (01/31/16 1400)   Received call from spouse. She reports that Marcus [name] has a decreased appetite and is not eating well. Denies difficulty with swallowing. Has tried different kinds of food and feels that if he forces himself to eat he will get sick. Asking if he can take something to help appetite. He is out of town and any scripts would need to go to [facility] in [place]. Please advise as I need to call patient back.

## 2016-01-31 NOTE — Telephone Encounter (Signed)
Called and spoke with patient's wife.  Informed her that we are going to fax over prescriptions for Carafate and Magic Mouthwash to [facility] in [place], [place].  Fax [phone].  Verbalized understanding.

## 2016-02-01 ENCOUNTER — Other Ambulatory Visit: Payer: Self-pay | Admitting: *Deleted

## 2016-02-01 ENCOUNTER — Telehealth: Payer: Self-pay

## 2016-02-01 MED ORDER — PREDNISONE 20 MG PO TABS: 20.0000 mg | ORAL_TABLET | Freq: Every day | ORAL | 0 refills | Status: DC

## 2016-02-01 NOTE — Telephone Encounter (Signed)
md spoke with RN-md would like to prescribe prednisone 20 mg daily x 14 days. This RX to be faxed to [phone]--[facility] in [place], [place]

## 2016-02-01 NOTE — Telephone Encounter (Signed)
  Oncology Nurse Navigator Documentation  Navigator Location: [facility] (02/01/16 1400) Navigator Encounter Type: Telephone (02/01/16 1400) Telephone: Outgoing Call (02/01/16 1400)                                        Time Spent with Patient: 15 (02/01/16 1400)   Called and spoke with spouse. New script for prednisone being sent to pharmacy in [place] to help increase appetite. Encouraged also trying the carafate as it will coat stomach and may help him to tolerate more food.

## 2016-02-07 ENCOUNTER — Encounter: Payer: Self-pay | Admitting: Internal Medicine

## 2016-02-07 ENCOUNTER — Inpatient Hospital Stay: Payer: Medicare Other

## 2016-02-07 ENCOUNTER — Inpatient Hospital Stay: Payer: Medicare Other | Attending: Internal Medicine | Admitting: Internal Medicine

## 2016-02-07 VITALS — BP 110/61 | HR 68 | Temp 95.3°F | Resp 18 | Ht 74.0 in | Wt 195.0 lb

## 2016-02-07 DIAGNOSIS — Z8673 Personal history of transient ischemic attack (TIA), and cerebral infarction without residual deficits: Secondary | ICD-10-CM | POA: Insufficient documentation

## 2016-02-07 DIAGNOSIS — Z87891 Personal history of nicotine dependence: Secondary | ICD-10-CM | POA: Diagnosis not present

## 2016-02-07 DIAGNOSIS — R040 Epistaxis: Secondary | ICD-10-CM | POA: Diagnosis not present

## 2016-02-07 DIAGNOSIS — H3530 Unspecified macular degeneration: Secondary | ICD-10-CM | POA: Insufficient documentation

## 2016-02-07 DIAGNOSIS — M109 Gout, unspecified: Secondary | ICD-10-CM | POA: Insufficient documentation

## 2016-02-07 DIAGNOSIS — I129 Hypertensive chronic kidney disease with stage 1 through stage 4 chronic kidney disease, or unspecified chronic kidney disease: Secondary | ICD-10-CM | POA: Diagnosis not present

## 2016-02-07 DIAGNOSIS — K219 Gastro-esophageal reflux disease without esophagitis: Secondary | ICD-10-CM | POA: Insufficient documentation

## 2016-02-07 DIAGNOSIS — N189 Chronic kidney disease, unspecified: Secondary | ICD-10-CM | POA: Diagnosis not present

## 2016-02-07 DIAGNOSIS — M1990 Unspecified osteoarthritis, unspecified site: Secondary | ICD-10-CM | POA: Insufficient documentation

## 2016-02-07 DIAGNOSIS — Z86718 Personal history of other venous thrombosis and embolism: Secondary | ICD-10-CM | POA: Diagnosis not present

## 2016-02-07 DIAGNOSIS — H409 Unspecified glaucoma: Secondary | ICD-10-CM | POA: Diagnosis not present

## 2016-02-07 DIAGNOSIS — R634 Abnormal weight loss: Secondary | ICD-10-CM

## 2016-02-07 DIAGNOSIS — E785 Hyperlipidemia, unspecified: Secondary | ICD-10-CM | POA: Diagnosis not present

## 2016-02-07 DIAGNOSIS — C155 Malignant neoplasm of lower third of esophagus: Secondary | ICD-10-CM | POA: Diagnosis present

## 2016-02-07 DIAGNOSIS — Z79899 Other long term (current) drug therapy: Secondary | ICD-10-CM

## 2016-02-07 DIAGNOSIS — N400 Enlarged prostate without lower urinary tract symptoms: Secondary | ICD-10-CM | POA: Diagnosis not present

## 2016-02-07 DIAGNOSIS — Z923 Personal history of irradiation: Secondary | ICD-10-CM | POA: Insufficient documentation

## 2016-02-07 LAB — CBC WITH DIFFERENTIAL/PLATELET
Basophils Absolute: 0 10*3/uL (ref 0–0.1)
Basophils Relative: 0 %
Eosinophils Absolute: 0 10*3/uL (ref 0–0.7)
Eosinophils Relative: 0 %
HEMATOCRIT: 26.6 % — AB (ref 40.0–52.0)
HEMOGLOBIN: 9.2 g/dL — AB (ref 13.0–18.0)
LYMPHS ABS: 0.5 10*3/uL — AB (ref 1.0–3.6)
LYMPHS PCT: 7 %
MCH: 34.5 pg — ABNORMAL HIGH (ref 26.0–34.0)
MCHC: 34.6 g/dL (ref 32.0–36.0)
MCV: 99.7 fL (ref 80.0–100.0)
MONOS PCT: 11 %
Monocytes Absolute: 0.7 10*3/uL (ref 0.2–1.0)
NEUTROS ABS: 5.1 10*3/uL (ref 1.4–6.5)
NEUTROS PCT: 82 %
Platelets: 182 10*3/uL (ref 150–440)
RBC: 2.67 MIL/uL — ABNORMAL LOW (ref 4.40–5.90)
RDW: 16.9 % — ABNORMAL HIGH (ref 11.5–14.5)
WBC: 6.3 10*3/uL (ref 3.8–10.6)

## 2016-02-07 LAB — COMPREHENSIVE METABOLIC PANEL
ALT: 15 U/L — AB (ref 17–63)
ANION GAP: 8 (ref 5–15)
AST: 20 U/L (ref 15–41)
Albumin: 3.2 g/dL — ABNORMAL LOW (ref 3.5–5.0)
Alkaline Phosphatase: 80 U/L (ref 38–126)
BUN: 33 mg/dL — ABNORMAL HIGH (ref 6–20)
CHLORIDE: 108 mmol/L (ref 101–111)
CO2: 21 mmol/L — AB (ref 22–32)
Calcium: 8.5 mg/dL — ABNORMAL LOW (ref 8.9–10.3)
Creatinine, Ser: 1.48 mg/dL — ABNORMAL HIGH (ref 0.61–1.24)
GFR calc non Af Amer: 42 mL/min — ABNORMAL LOW (ref >60)
GFR, EST AFRICAN AMERICAN: 49 mL/min — AB (ref >60)
Glucose, Bld: 206 mg/dL — ABNORMAL HIGH (ref 65–99)
Potassium: 4.8 mmol/L (ref 3.5–5.1)
SODIUM: 137 mmol/L (ref 135–145)
Total Bilirubin: 0.4 mg/dL (ref 0.3–1.2)
Total Protein: 6.5 g/dL (ref 6.5–8.1)

## 2016-02-07 MED ORDER — SODIUM CHLORIDE 0.9 % IJ SOLN
10.0000 mL | Freq: Once | INTRAMUSCULAR | Status: AC
  Administered 2016-02-07: 10 mL via INTRAVENOUS
  Filled 2016-02-07: qty 10

## 2016-02-07 MED ORDER — HEPARIN SOD (PORK) LOCK FLUSH 100 UNIT/ML IV SOLN
500.0000 [IU] | Freq: Once | INTRAVENOUS | Status: AC
  Administered 2016-02-07: 500 [IU] via INTRAVENOUS
  Filled 2016-02-07: qty 5

## 2016-02-07 NOTE — Progress Notes (Signed)
[facility] OFFICE PROGRESS NOTE  Patient Care Team: Chesley Noon, MD as PCP - General (Family Medicine)  No matching staging information was found for the patient.   Oncology History   # June 2017- Localized [? Stage II; no EUS]Distal Esophagus Adeno CA [Bx Dr.Elliot]; PET- no distant Mets; July 5th 2017-concurrent Carbo-Taxol- RT  # LLL nodule- monitor for now [PET June 2017]   # CKD [creat 1.4-1.7]; Hx of stroke [facility] 2016]  June 2017-MOLECULAR STUDIES- Her 2- IHC-NEG; MMR-12/18/2015- ordered.      Esophageal cancer ([place])   11/10/2015 Initial Diagnosis    Esophageal cancer (HCC)- distal esophagus- non-circumferential partially obstructing ulcerating mass- biopsy positive for mod diff  adenocarcinoma [Dr.Elliot]       Malignant neoplasm of lower third of esophagus ([place])   12/18/2015 Initial Diagnosis    Malignant neoplasm of lower third of esophagus ([place])         INTERVAL HISTORY:  Marcus [name] 82 y.o.  male pleasant patient above history of Locally advanced esophageal cancer- currently s/p definitive chemoradiation is here for follow-up; patient's last radiation was in 01/16/2016.  Patient has a poor appetite. He lost some weight. He had been recently started on prednisone he just took it for 2 days. He feels weak. He was tired. No falls.  No nausea no vomiting. No difficulty swallowing. Chronic mild swelling in the legs. No tingling or numbness. Skin lesions are improving; status post antibiotics.  REVIEW OF SYSTEMS:  A complete 10 point review of system is done which is negative except mentioned above/history of present illness.   PAST MEDICAL HISTORY :  Past Medical History:  Diagnosis Date  . Aphakia of right eye   . Arthritis   . Cancer ([place])    SKIN  . Enlarged prostate without lower urinary tract symptoms (luts)   . Esophageal cancer ([place])   . GERD (gastroesophageal reflux disease)   . Glaucoma   . Gout   . History of CVA  (cerebrovascular accident)    Cerebral infarction Nov. 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 History of Doppler ultrasound    dopplers revealed mild left ICA stenosis which has remained stable, occluded left subclavian artery with retrograde left vertebral filling and moderately severe right subclavian artery stenosis. he has no symptoms of upper extermity claudication or subclavian steal symptoms.   . History of DVT (deep vein thrombosis)    right arm  . History of stress test 09/02/2009   abnormal myocardial perfusion scan demonstrating an attenuation defect in the inferior region of the myocardium. No ischemia or infarct/scar is seen in the remaining myocardium. No prior study available for comparison. Abnormal myocardial study EF 79%  . Hyperlipemia   . Hypertension   . Macular degeneration   . Stroke ([place])   . Subclavian artery stenosis (HCC)     PAST SURGICAL HISTORY :   Past Surgical History:  Procedure Laterality Date  . CATARACT EXTRACTION    . COLONOSCOPY  2002  . ESOPHAGOGASTRODUODENOSCOPY (EGD) WITH PROPOFOL N/A 11/10/2015   Procedure: ESOPHAGOGASTRODUODENOSCOPY (EGD) WITH PROPOFOL;  Surgeon: Manya Silvas, MD;  Location: [facility]COPY;  Service: Endoscopy;  Laterality: N/A;  . FRACTURE SURGERY    . MOHS SURGERY    . PERIPHERAL VASCULAR CATHETERIZATION N/A 11/29/2015   Procedure: Glori Luis Cath Insertion;  Surgeon: Algernon Huxley, MD;  Location: [facility];  Service: Cardiovascular;  Laterality: N/A;    FAMILY HISTORY :  Family History  Problem Relation Age of Onset  . Heart disease Brother   . Cancer - Colon Sister   . Cancer Sister   . Other Brother     CABG    SOCIAL HISTORY:   Social History  Substance Use Topics  . Smoking status: Former Smoker    Packs/day: 1.00    Years: 40.00    Types: Cigarettes    Quit date: 06/03/1993  . Smokeless tobacco: Never Used  . Alcohol use No    ALLERGIES:  is allergic to codeine; allopurinol;  simvastatin; and uloric [febuxostat].  MEDICATIONS:  Current Outpatient Prescriptions  Medication Sig Dispense Refill  . atorvastatin (LIPITOR) 20 MG tablet Take 1 tablet (20 mg total) by mouth daily at 6 PM. 30 tablet 1  . clopidogrel (PLAVIX) 75 MG tablet Take 1 tablet (75 mg total) by mouth daily. 30 tablet 1  . Coenzyme Q10 (COQ10) 100 MG CAPS Take by mouth. Reported on 11/10/2015    . cyanocobalamin 500 MCG tablet Take 500 mcg by mouth daily.    . dorzolamide (TRUSOPT) 2 % ophthalmic solution Place 1 drop into both eyes 2 (two) times daily.    Marland Kitchen doxazosin (CARDURA) 2 MG tablet Take 2 mg by mouth daily.    . finasteride (PROSCAR) 5 MG tablet Take 5 mg by mouth daily.  10  . latanoprost (XALATAN) 0.005 % ophthalmic solution Place 1 drop into both eyes at bedtime.    . lidocaine-prilocaine (EMLA) cream Apply 1 application topically as needed. Apply generously over the Mediport 45 minutes prior to chemotherapy. 30 g 0  . magnesium oxide (MAG-OX) 400 MG tablet Take 400 mg by mouth daily.    . metoprolol succinate (TOPROL-XL) 25 MG 24 hr tablet TAKE ONE TABLET (25 MG TOTAL) BY MOUTH DAILY.    Marland Kitchen ondansetron (ZOFRAN) 8 MG tablet Take 1 tablet (8 mg total) by mouth every 8 (eight) hours as needed for nausea or vomiting (start 3 days; after chemo). 40 tablet 1  . predniSONE (DELTASONE) 20 MG tablet Take 1 tablet (20 mg total) by mouth daily with breakfast. 14 tablet 0  . prochlorperazine (COMPAZINE) 10 MG tablet Take 1 tablet (10 mg total) by mouth every 6 (six) hours as needed for nausea or vomiting. 40 tablet 1  . RA LANSOPRAZOLE 15 MG capsule take 2 capsules by mouth once daily AT 12 NOON  1  . terazosin (HYTRIN) 10 MG capsule Take 10 mg by mouth at bedtime.    . timolol (TIMOPTIC) 0.5 % ophthalmic solution Place 1 drop into both eyes 2 (two) times daily.  3  . topiramate (TOPAMAX) 25 MG tablet Take 25 mg by mouth 2 (two) times daily.  0  . Vitamin D, Ergocalciferol, (DRISDOL) 50000 UNITS CAPS  Take 50,000 Units by mouth every 7 (seven) days.    . lansoprazole (PREVACID) 30 MG capsule Take 1 capsule (30 mg total) by mouth daily at 12 noon. 30 capsule 11  . sucralfate (CARAFATE) 1 GM/10ML suspension Take 10 mLs (1 g total) by mouth 4 (four) times daily -  with meals and at bedtime. (Patient not taking: Reported on 02/07/2016) 420 mL 0   No current facility-administered medications for this visit.     PHYSICAL EXAMINATION: ECOG PERFORMANCE STATUS: 2 - Symptomatic, <50% confined to bed  BP 110/61 (BP Location: Right Arm, Patient Position: Sitting)   Pulse 68   Temp (!) 95.3 F (35.2 C) (Tympanic)   Resp 18   Ht  6' 2"  (1.88 m)   Wt 195 lb (88.5 kg)   BMI 25.04 kg/m   Filed Weights   02/07/16 1000  Weight: 195 lb (88.5 kg)    GENERAL: Well-nourished well-developed; Alert, no distress and comfortable.  Accompanied by his wife. He walks with a cane. EYES: no pallor or icterus OROPHARYNX: no thrush or ulceration;   NECK: supple, no masses felt LYMPH:  no palpable lymphadenopathy in the cervical, axillary or inguinal regions LUNGS: clear to auscultation and  No wheeze or crackles HEART/CVS: regular rate & rhythm and no murmurs; No lower extremity edema ABDOMEN:abdomen soft, non-tender and normal bowel sounds Musculoskeletal:no cyanosis of digits and no clubbing  PSYCH: alert & oriented x 3 with fluent speech NEURO: no focal motor/sensory deficits SKIN:  no rashes; improving scabbing lesions noted chest upper extremity and left lower extremity.  LABORATORY DATA:  I have reviewed the data as listed    Component Value Date/Time   NA 137 02/07/2016 0909   K 4.8 02/07/2016 0909   CL 108 02/07/2016 0909   CO2 21 (L) 02/07/2016 0909   GLUCOSE 206 (H) 02/07/2016 0909   BUN 33 (H) 02/07/2016 0909   CREATININE 1.48 (H) 02/07/2016 0909   CALCIUM 8.5 (L) 02/07/2016 0909   PROT 6.5 02/07/2016 0909   ALBUMIN 3.2 (L) 02/07/2016 0909   AST 20 02/07/2016 0909   ALT 15 (L)  02/07/2016 0909   ALKPHOS 80 02/07/2016 0909   BILITOT 0.4 02/07/2016 0909   GFRNONAA 42 (L) 02/07/2016 0909   GFRAA 49 (L) 02/07/2016 0909    No results found for: SPEP, UPEP  Lab Results  Component Value Date   WBC 6.3 02/07/2016   NEUTROABS 5.1 02/07/2016   HGB 9.2 (L) 02/07/2016   HCT 26.6 (L) 02/07/2016   MCV 99.7 02/07/2016   PLT 182 02/07/2016      Chemistry      Component Value Date/Time   NA 137 02/07/2016 0909   K 4.8 02/07/2016 0909   CL 108 02/07/2016 0909   CO2 21 (L) 02/07/2016 0909   BUN 33 (H) 02/07/2016 0909   CREATININE 1.48 (H) 02/07/2016 0909      Component Value Date/Time   CALCIUM 8.5 (L) 02/07/2016 0909   ALKPHOS 80 02/07/2016 0909   AST 20 02/07/2016 0909   ALT 15 (L) 02/07/2016 0909   BILITOT 0.4 02/07/2016 0909       RADIOGRAPHIC STUDIES: I have personally reviewed the radiological images as listed and agreed with the findings in the report. No results found.   ASSESSMENT & PLAN:  Malignant neoplasm of lower third of esophagus (HCC) Localized esophageal cancer; no evidence of any local adenopathy. On concurent definitive chemo [July 3rd]-RT  [July 5th]. Patient tolerating chemotherapy very well [RT finished Aug 15th per pt].  # HOLD consolidation chemo today; plan PET in 6 weeks.   # weight loss- 7 pounds- recommend up to 3 boost a day.   # Nose bleeds/ on plavix [hx of stroke in dec 2016]- recommend saline spray/- improved.   # CKD- creat 1.4- stable.   # follow up in 7 weeks; labs/ PET scan few days prior. Will decide then if needs more chemo.    Orders Placed This Encounter  Procedures  . NM PET Image Restag (PS) Skull Base To Thigh    Standing Status:   Future    Standing Expiration Date:   04/08/2017    Order Specific Question:   Reason for Exam (SYMPTOM  OR DIAGNOSIS REQUIRED)    Answer:   esophageal cancer    Order Specific Question:   Preferred imaging location?    Answer:   [facility]  . CBC with Differential     Standing Status:   Future    Standing Expiration Date:   02/06/2017  . Comprehensive metabolic panel    Standing Status:   Future    Standing Expiration Date:   02/06/2017  . CEA    Standing Status:   Future    Standing Expiration Date:   02/06/2017   All questions were answered. The patient knows to call the clinic with any problems, questions or concerns.      Cammie Sickle, MD 02/07/2016 6:04 PM

## 2016-02-07 NOTE — Assessment & Plan Note (Signed)
Localized esophageal cancer; no evidence of any local adenopathy. On concurent definitive chemo [July 3rd]-RT  [July 5th]. Patient tolerating chemotherapy very well [RT finished Aug 15th per pt].  # HOLD consolidation chemo today; plan PET in 6 weeks.   # weight loss- 7 pounds- recommend up to 3 boost a day.   # Nose bleeds/ on plavix [hx of stroke in dec 2016]- recommend saline spray/- improved.   # CKD- creat 1.4- stable.   # follow up in 7 weeks; labs/ PET scan few days prior. Will decide then if needs more chemo.

## 2016-02-09 ENCOUNTER — Inpatient Hospital Stay: Payer: Medicare Other

## 2016-02-12 ENCOUNTER — Ambulatory Visit: Payer: Medicare Other | Admitting: Nurse Practitioner

## 2016-02-23 ENCOUNTER — Encounter (HOSPITAL_COMMUNITY): Payer: Self-pay | Admitting: Internal Medicine

## 2016-02-23 ENCOUNTER — Inpatient Hospital Stay (HOSPITAL_COMMUNITY)
Admission: EM | Admit: 2016-02-23 | Discharge: 2016-02-26 | DRG: 176 | Disposition: A | Discharge status: Home / Self Care | Payer: Medicare Other | Source: Other Acute Inpatient Hospital | Attending: Internal Medicine | Admitting: Internal Medicine

## 2016-02-23 DIAGNOSIS — I2699 Other pulmonary embolism without acute cor pulmonale: Secondary | ICD-10-CM | POA: Diagnosis present

## 2016-02-23 DIAGNOSIS — I472 Ventricular tachycardia: Secondary | ICD-10-CM | POA: Diagnosis not present

## 2016-02-23 DIAGNOSIS — E785 Hyperlipidemia, unspecified: Secondary | ICD-10-CM | POA: Diagnosis present

## 2016-02-23 DIAGNOSIS — Z923 Personal history of irradiation: Secondary | ICD-10-CM

## 2016-02-23 DIAGNOSIS — N183 Chronic kidney disease, stage 3 unspecified: Secondary | ICD-10-CM | POA: Diagnosis present

## 2016-02-23 DIAGNOSIS — N400 Enlarged prostate without lower urinary tract symptoms: Secondary | ICD-10-CM | POA: Diagnosis present

## 2016-02-23 DIAGNOSIS — M109 Gout, unspecified: Secondary | ICD-10-CM | POA: Diagnosis present

## 2016-02-23 DIAGNOSIS — H3530 Unspecified macular degeneration: Secondary | ICD-10-CM | POA: Diagnosis present

## 2016-02-23 DIAGNOSIS — Z8249 Family history of ischemic heart disease and other diseases of the circulatory system: Secondary | ICD-10-CM

## 2016-02-23 DIAGNOSIS — I6522 Occlusion and stenosis of left carotid artery: Secondary | ICD-10-CM | POA: Diagnosis present

## 2016-02-23 DIAGNOSIS — I129 Hypertensive chronic kidney disease with stage 1 through stage 4 chronic kidney disease, or unspecified chronic kidney disease: Secondary | ICD-10-CM | POA: Diagnosis present

## 2016-02-23 DIAGNOSIS — Z888 Allergy status to other drugs, medicaments and biological substances status: Secondary | ICD-10-CM

## 2016-02-23 DIAGNOSIS — D500 Iron deficiency anemia secondary to blood loss (chronic): Secondary | ICD-10-CM | POA: Diagnosis present

## 2016-02-23 DIAGNOSIS — Z9221 Personal history of antineoplastic chemotherapy: Secondary | ICD-10-CM

## 2016-02-23 DIAGNOSIS — C159 Malignant neoplasm of esophagus, unspecified: Secondary | ICD-10-CM | POA: Diagnosis present

## 2016-02-23 DIAGNOSIS — Z809 Family history of malignant neoplasm, unspecified: Secondary | ICD-10-CM

## 2016-02-23 DIAGNOSIS — H4089 Other specified glaucoma: Secondary | ICD-10-CM | POA: Diagnosis present

## 2016-02-23 DIAGNOSIS — Z79899 Other long term (current) drug therapy: Secondary | ICD-10-CM

## 2016-02-23 DIAGNOSIS — R079 Chest pain, unspecified: Secondary | ICD-10-CM | POA: Diagnosis present

## 2016-02-23 DIAGNOSIS — Z885 Allergy status to narcotic agent status: Secondary | ICD-10-CM

## 2016-02-23 DIAGNOSIS — I82441 Acute embolism and thrombosis of right tibial vein: Secondary | ICD-10-CM | POA: Diagnosis present

## 2016-02-23 DIAGNOSIS — Z85828 Personal history of other malignant neoplasm of skin: Secondary | ICD-10-CM

## 2016-02-23 DIAGNOSIS — Z86718 Personal history of other venous thrombosis and embolism: Secondary | ICD-10-CM | POA: Diagnosis not present

## 2016-02-23 DIAGNOSIS — Z87891 Personal history of nicotine dependence: Secondary | ICD-10-CM

## 2016-02-23 DIAGNOSIS — K219 Gastro-esophageal reflux disease without esophagitis: Secondary | ICD-10-CM | POA: Diagnosis present

## 2016-02-23 DIAGNOSIS — I10 Essential (primary) hypertension: Secondary | ICD-10-CM | POA: Diagnosis not present

## 2016-02-23 DIAGNOSIS — E440 Moderate protein-calorie malnutrition: Secondary | ICD-10-CM | POA: Insufficient documentation

## 2016-02-23 DIAGNOSIS — Z8673 Personal history of transient ischemic attack (TIA), and cerebral infarction without residual deficits: Secondary | ICD-10-CM | POA: Diagnosis not present

## 2016-02-23 DIAGNOSIS — Z7902 Long term (current) use of antithrombotics/antiplatelets: Secondary | ICD-10-CM

## 2016-02-23 DIAGNOSIS — C155 Malignant neoplasm of lower third of esophagus: Secondary | ICD-10-CM | POA: Diagnosis not present

## 2016-02-23 DIAGNOSIS — I708 Atherosclerosis of other arteries: Secondary | ICD-10-CM | POA: Diagnosis present

## 2016-02-23 MED ORDER — ONDANSETRON HCL 4 MG PO TABS: 4.0000 mg | ORAL_TABLET | Freq: Four times a day (QID) | ORAL | Status: DC | PRN

## 2016-02-23 MED ORDER — ORAL CARE MOUTH RINSE
15.0000 mL | Freq: Two times a day (BID) | OROMUCOSAL | Status: DC
  Administered 2016-02-24 – 2016-02-26 (×4): 15 mL via OROMUCOSAL

## 2016-02-23 MED ORDER — DORZOLAMIDE HCL 2 % OP SOLN
1.0000 [drp] | Freq: Two times a day (BID) | OPHTHALMIC | Status: DC
  Administered 2016-02-23 – 2016-02-26 (×6): 1 [drp] via OPHTHALMIC
  Filled 2016-02-23: qty 10

## 2016-02-23 MED ORDER — HYDROCODONE-ACETAMINOPHEN 5-325 MG PO TABS: 1.0000 | ORAL_TABLET | ORAL | Status: DC | PRN

## 2016-02-23 MED ORDER — MORPHINE SULFATE (PF) 2 MG/ML IV SOLN
1.0000 mg | Freq: Once | INTRAVENOUS | Status: AC
  Administered 2016-02-23: 1 mg via INTRAVENOUS
  Filled 2016-02-23: qty 1

## 2016-02-23 MED ORDER — SODIUM CHLORIDE 0.9% FLUSH
3.0000 mL | Freq: Two times a day (BID) | INTRAVENOUS | Status: DC
  Administered 2016-02-24 – 2016-02-26 (×4): 3 mL via INTRAVENOUS

## 2016-02-23 MED ORDER — CLOPIDOGREL BISULFATE 75 MG PO TABS
75.0000 mg | ORAL_TABLET | Freq: Every day | ORAL | Status: DC
  Administered 2016-02-24 – 2016-02-26 (×3): 75 mg via ORAL
  Filled 2016-02-23 (×3): qty 1

## 2016-02-23 MED ORDER — ENOXAPARIN SODIUM 100 MG/ML ~~LOC~~ SOLN
1.0000 mg/kg | Freq: Two times a day (BID) | SUBCUTANEOUS | Status: DC
Start: 2016-02-23 — End: 2016-02-23

## 2016-02-23 MED ORDER — TIMOLOL MALEATE 0.5 % OP SOLN
1.0000 [drp] | Freq: Two times a day (BID) | OPHTHALMIC | Status: DC
  Administered 2016-02-23 – 2016-02-26 (×8): 1 [drp] via OPHTHALMIC
  Filled 2016-02-23: qty 5

## 2016-02-23 MED ORDER — MAGNESIUM OXIDE 400 (241.3 MG) MG PO TABS
400.0000 mg | ORAL_TABLET | Freq: Every day | ORAL | Status: DC
  Administered 2016-02-24 – 2016-02-26 (×3): 400 mg via ORAL
  Filled 2016-02-23 (×3): qty 1

## 2016-02-23 MED ORDER — ACETAMINOPHEN 650 MG RE SUPP: 650.0000 mg | Freq: Four times a day (QID) | RECTAL | Status: DC | PRN

## 2016-02-23 MED ORDER — SODIUM CHLORIDE 0.9 % IV SOLN
INTRAVENOUS | Status: AC
  Administered 2016-02-23: via INTRAVENOUS

## 2016-02-23 MED ORDER — ONDANSETRON HCL 4 MG/2ML IJ SOLN: 4.0000 mg | Freq: Four times a day (QID) | INTRAMUSCULAR | Status: DC | PRN

## 2016-02-23 MED ORDER — SODIUM CHLORIDE 0.9% FLUSH
10.0000 mL | INTRAVENOUS | Status: DC | PRN
  Administered 2016-02-24 – 2016-02-26 (×4): 10 mL
  Filled 2016-02-23 (×4): qty 40

## 2016-02-23 MED ORDER — LATANOPROST 0.005 % OP SOLN
1.0000 [drp] | Freq: Every day | OPHTHALMIC | Status: DC
  Administered 2016-02-23 – 2016-02-25 (×3): 1 [drp] via OPHTHALMIC
  Filled 2016-02-23: qty 2.5

## 2016-02-23 MED ORDER — ENOXAPARIN SODIUM 100 MG/ML ~~LOC~~ SOLN
1.0000 mg/kg | Freq: Two times a day (BID) | SUBCUTANEOUS | Status: DC
  Administered 2016-02-23 – 2016-02-24 (×3): 90 mg via SUBCUTANEOUS
  Filled 2016-02-23 (×3): qty 1

## 2016-02-23 MED ORDER — ATORVASTATIN CALCIUM 10 MG PO TABS
20.0000 mg | ORAL_TABLET | Freq: Every day | ORAL | Status: DC
  Administered 2016-02-24 – 2016-02-26 (×3): 20 mg via ORAL
  Filled 2016-02-23 (×4): qty 2

## 2016-02-23 MED ORDER — ACETAMINOPHEN 325 MG PO TABS: 650.0000 mg | ORAL_TABLET | Freq: Four times a day (QID) | ORAL | Status: DC | PRN

## 2016-02-23 NOTE — Progress Notes (Addendum)
[facility] for Enoxaparin Indication: pulmonary embolus  Allergies  Allergen Reactions  . Codeine Other (See Comments) and Anaphylaxis    Swollen tongue, numb around mouth, slurred speech Swollen tongue, numb around mouth, slurred speech  . Allopurinol Other (See Comments)    Hand pain  . Simvastatin Other (See Comments)    myalgias  . Uloric [Febuxostat] Other (See Comments)    Stomach pain    Patient Measurements: Height: 6\' 2"  (188 cm) Weight: 193 lb 12.6 oz (87.9 kg) IBW/kg (Calculated) : 82.2  Vital Signs: Temp: 97.9 F (36.6 C) (09/22 2052) Temp Source: Oral (09/22 2052) BP: 181/64 (09/22 2052) Pulse Rate: 73 (09/22 2052)  Labs: No results for input(s): HGB, HCT, PLT, APTT, LABPROT, INR, HEPARINUNFRC, HEPRLOWMOCWT, CREATININE, CKTOTAL, CKMB, TROPONINI in the last 72 hours.  Estimated Creatinine Clearance: 44.7 mL/min (by C-G formula based on SCr of 1.48 mg/dL (H)).   Medical History: Past Medical History:  Diagnosis Date  . Aphakia of right eye   . Arthritis   . Cancer ([place])    SKIN  . Enlarged prostate without lower urinary tract symptoms (luts)   . Esophageal cancer ([place])   . GERD (gastroesophageal reflux disease)   . Glaucoma   . Gout   . History of CVA (cerebrovascular accident)    Cerebral infarction Nov. 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 History of Doppler ultrasound    dopplers revealed mild left ICA stenosis which has remained stable, occluded left subclavian artery with retrograde left vertebral filling and moderately severe right subclavian artery stenosis. he has no symptoms of upper extermity claudication or subclavian steal symptoms.   . History of DVT (deep vein thrombosis)    right arm  . History of stress test 09/02/2009   abnormal myocardial perfusion scan demonstrating an attenuation defect in the inferior region of the myocardium. No ischemia or infarct/scar is seen in the remaining  myocardium. No prior study available for comparison. Abnormal myocardial study EF 79%  . Hyperlipemia   . Hypertension   . Macular degeneration   . Stroke ([place])   . Subclavian artery stenosis (HCC)     Medications:  Prescriptions Prior to Admission  Medication Sig Dispense Refill Last Dose  . amLODipine (NORVASC) 2.5 MG tablet Take 2.5 mg by mouth daily.  0 02/23/2016 at Unknown time  . atorvastatin (LIPITOR) 20 MG tablet Take 1 tablet (20 mg total) by mouth daily at 6 PM. (Patient taking differently: Take 20 mg by mouth daily with breakfast. ) 30 tablet 1 02/23/2016 at Unknown time  . clopidogrel (PLAVIX) 75 MG tablet Take 1 tablet (75 mg total) by mouth daily. 30 tablet 1 02/23/2016 at Unknown time  . Coenzyme Q10 (COQ10) 100 MG CAPS Take 100 mg by mouth daily. Reported on 11/10/2015   Past Month at Unknown time  . cyanocobalamin 500 MCG tablet Take 500 mcg by mouth daily.   02/23/2016 at Unknown time  . dexamethasone (DECADRON) 4 MG tablet Take 8 mg by mouth as directed. 8 mg the day after chemo for 2 days following chemotherapy  0 ~4 weeks ago  . dorzolamide (TRUSOPT) 2 % ophthalmic solution Place 1 drop into both eyes 2 (two) times daily.   02/23/2016 at am only  . finasteride (PROSCAR) 5 MG tablet Take 5 mg by mouth daily.  10 02/22/2016 at Unknown time  . latanoprost (XALATAN) 0.005 % ophthalmic solution Place 1 drop into both eyes at bedtime.  02/22/2016 at Unknown time  . lidocaine-prilocaine (EMLA) cream Apply 1 application topically as needed. Apply generously over the Mediport 45 minutes prior to chemotherapy. 30 g 0 ~4 weeks ago  . magnesium oxide (MAG-OX) 400 MG tablet Take 400 mg by mouth daily.   02/22/2016 at Unknown time  . metoprolol succinate (TOPROL-XL) 25 MG 24 hr tablet TAKE ONE TABLET (25 MG TOTAL) BY MOUTH DAILY.   02/22/2016 at 2230  . ondansetron (ZOFRAN) 8 MG tablet Take 1 tablet (8 mg total) by mouth every 8 (eight) hours as needed for nausea or vomiting (start 3 days; after  chemo). 40 tablet 1 unknown  . prochlorperazine (COMPAZINE) 10 MG tablet Take 1 tablet (10 mg total) by mouth every 6 (six) hours as needed for nausea or vomiting. 40 tablet 1 unknown  . terazosin (HYTRIN) 10 MG capsule Take 10 mg by mouth at bedtime.   02/22/2016 at Unknown time  . timolol (TIMOPTIC) 0.5 % ophthalmic solution Place 1 drop into both eyes 2 (two) times daily.  3 02/23/2016 at am only  . Vitamin D, Ergocalciferol, (DRISDOL) 50000 UNITS CAPS Take 50,000 Units by mouth every 7 (seven) days.   02/22/2016  . lansoprazole (PREVACID) 30 MG capsule Take 1 capsule (30 mg total) by mouth daily at 12 noon. (Patient not taking: Reported on 02/23/2016) 30 capsule 11 Not Taking at Unknown time  . predniSONE (DELTASONE) 20 MG tablet Take 1 tablet (20 mg total) by mouth daily with breakfast. (Patient not taking: Reported on 02/23/2016) 14 tablet 0 Not Taking at Unknown time  . sucralfate (CARAFATE) 1 GM/10ML suspension Take 10 mLs (1 g total) by mouth 4 (four) times daily -  with meals and at bedtime. (Patient not taking: Reported on 02/07/2016) 420 mL 0 Not Taking   Scheduled:  . [START ON 02/24/2016] enoxaparin (LOVENOX) injection  1 mg/kg Subcutaneous BID  . mouth rinse  15 mL Mouth Rinse BID    Assessment: 82 y.o. male admitted 02/23/2016 for Pulmonary embolism; no MD notes yet   CBC: pending; Hgb low and Plt wnl on 9/6; no chemo since then so likely improved  SCr: elevated on 9/6; BMP ordered for tomorrow AM  Previous anticoagulation: none; did receive Lovenox 90 mg today at "mid-afternoon"   Goal of Therapy: Anti-Xa level 0.6-1 units/ml 4hrs after LMWH dose given  Plan:  Lovenox 90 mg SQ q12 hr  BMP tomorrow AM  CBC q72 while on Lovenox  Monitor for signs of bleeding or worsening thrombosis   Reuel Boom, PharmD, BCPS Pager: [phone] 02/23/2016, 9:37 PM

## 2016-02-23 NOTE — H&P (Signed)
Marcus [name] Marcus [name] [id number] DOB: 1931-08-09 DOA: 02/23/2016     PCP: Chesley Noon, MD   Outpatient Specialists: Oncology [facility] Patient coming from:   home Lives With family    Chief Complaint: ( of breath and chest pain  HPI: BRODUS [name] is a 82 y.o. male with medical history significant of locally advanced esophageal cancer, CK D,  stroke, gout, DVT of her right arm, HLD, HTN, macular degeneration, subclavian artery stenosis    Presented with shortness of breath and chest pain since this a.m. and he will copy faint was worsening his chest radiating to his right arm and radiated across his whole chest felt a sharp had mild increased swelling his lower extremities. Chest pain was better by being still and not taking deep breaths. He never had similar chest pain in the past. patient went to be evaluated at at hospital. He was found to have elevated d-dimer CT scan angiogram of his chest was done showing P with moderate burden. Patient was started on Lovenox and 2 L nasal cannula. And was transferred to [facility] long.  Patient denies recent travel. Chest pain was worse with inspiration He fevers or chills. He has been feeling weak and losing weight. Reports some bruising no melena no black stools.    Regarding pertinent Chronic problems: Recurrent patient's history of esophageal cancer he is status post definitive chemoradiation last radiation treatment was August 2017. She has history of CVA in November 2016 on Plavix. Patient has known history of DVT in the past  IN ER:  Temp (24hrs), Avg:97.9 F (36.6 C), Min:97.9 F (36.6 C), Max:97.9 F (36.6 C)   On arrival to emerge department blood pressure 167/72 pulse ox 95% room air heart rate 68 WBC 6.7 , Hg 9.7 Plt 98 Cr 1.5  Alb 2.7 D.dimer 5000. Normal 0-400 bnp 108 Trop <0.02 Following Medications were ordered in ER: Medications  enoxaparin (LOVENOX) injection 90 mg (90 mg Subcutaneous Given 02/23/16 2342)  MEDLINE  mouth rinse (not administered)  atorvastatin (LIPITOR) tablet 20 mg (not administered)  clopidogrel (PLAVIX) tablet 75 mg (not administered)  timolol (TIMOPTIC) 0.5 % ophthalmic solution 1 drop (1 drop Both Eyes Given 02/23/16 2342)  dorzolamide (TRUSOPT) 2 % ophthalmic solution 1 drop (1 drop Both Eyes Given 02/23/16 2342)  magnesium oxide (MAG-OX) tablet 400 mg (not administered)  latanoprost (XALATAN) 0.005 % ophthalmic solution 1 drop (1 drop Both Eyes Given 02/23/16 2343)  sodium chloride flush (NS) 0.9 % injection 3 mL (not administered)  acetaminophen (TYLENOL) tablet 650 mg (not administered)    Or  acetaminophen (TYLENOL) suppository 650 mg (not administered)  ondansetron (ZOFRAN) tablet 4 mg (not administered)    Or  ondansetron (ZOFRAN) injection 4 mg (not administered)  0.9 %  sodium chloride infusion ( Intravenous New Bag/Given 02/23/16 2349)  morphine 2 MG/ML injection 1 mg (1 mg Intravenous Given 02/23/16 2341)      Hospitalist was called for admission for Pulmonary embolism  Review of Systems:    Pertinent positives include: nausea, fatigue, weight loss chest pain,  shortness of breath at rest.   dyspnea on exertion, Constitutional:  No weight loss, night sweats, Fevers, chills,  HEENT:  No headaches, Difficulty swallowing,Tooth/dental problems,Sore throat,  No sneezing, itching, ear ache, nasal congestion, post nasal drip,  Cardio-vascular:  No Orthopnea, PND, anasarca, dizziness, palpitations.no Bilateral lower extremity swelling  GI:  No heartburn, indigestion, abdominal pain, , vomiting, diarrhea, change in bowel habits, loss of appetite, melena, blood in  stool, hematemesis Resp:  no  No excess mucus, no productive cough, No non-productive cough, No coughing up of blood.No change in color of mucus.No wheezing. Skin:  no rash or lesions. No jaundice GU:  no dysuria, change in color of urine, no urgency or frequency. No straining to urinate.  No flank pain.    Musculoskeletal:  No joint pain or no joint swelling. No decreased range of motion. No back pain.  Psych:  No change in mood or affect. No depression or anxiety. No memory loss.  Neuro: no localizing neurological complaints, no tingling, no weakness, no double vision, no gait abnormality, no slurred speech, no confusion  As per HPI otherwise 10 point review of systems negative.   Past Medical History: Past Medical History:  Diagnosis Date  . Aphakia of right eye   . Arthritis   . Cancer ([place])    SKIN  . Enlarged prostate without lower urinary tract symptoms (luts)   . Esophageal cancer ([place])   . GERD (gastroesophageal reflux disease)   . Glaucoma   . Gout   . History of CVA (cerebrovascular accident)    Cerebral infarction Nov. 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 History of Doppler ultrasound    dopplers revealed mild left ICA stenosis which has remained stable, occluded left subclavian artery with retrograde left vertebral filling and moderately severe right subclavian artery stenosis. he has no symptoms of upper extermity claudication or subclavian steal symptoms.   . History of DVT (deep vein thrombosis)    right arm  . History of stress test 09/02/2009   abnormal myocardial perfusion scan demonstrating an attenuation defect in the inferior region of the myocardium. No ischemia or infarct/scar is seen in the remaining myocardium. No prior study available for comparison. Abnormal myocardial study EF 79%  . Hyperlipemia   . Hypertension   . Macular degeneration   . Stroke ([place])   . Subclavian artery stenosis [facility])    Past Surgical History:  Procedure Laterality Date  . CATARACT EXTRACTION    . COLONOSCOPY  2002  . ESOPHAGOGASTRODUODENOSCOPY (EGD) WITH PROPOFOL N/A 11/10/2015   Procedure: ESOPHAGOGASTRODUODENOSCOPY (EGD) WITH PROPOFOL;  Surgeon: Manya Silvas, MD;  Location: [facility] ENDOSCOPY;  Service: Endoscopy;  Laterality: N/A;  . FRACTURE SURGERY    . MOHS  SURGERY    . PERIPHERAL VASCULAR CATHETERIZATION N/A 11/29/2015   Procedure: Glori Luis Cath Insertion;  Surgeon: Algernon Huxley, MD;  Location: [place] CV LAB;  Service: Cardiovascular;  Laterality: N/A;     Social History:  Ambulatory  Independently or cane     reports that he quit smoking about 22 years ago. His smoking use included Cigarettes. He has a 40.00 pack-year smoking history. He has never used smokeless tobacco. He reports that he does not drink alcohol or use drugs.  Allergies:   Allergies  Allergen Reactions  . Codeine Other (See Comments) and Anaphylaxis    Swollen tongue, numb around mouth, slurred speech Swollen tongue, numb around mouth, slurred speech  . Allopurinol Other (See Comments)    Hand pain  . Simvastatin Other (See Comments)    myalgias  . Uloric [Febuxostat] Other (See Comments)    Stomach pain     Family History:   Family History  Problem Relation Age of Onset  . Heart disease Brother   . Cancer - Colon Sister   . Cancer Sister   . Other Brother     CABG    Medications: Prior  to Admission medications   Medication Sig Start Date End Date Taking? Authorizing Provider  amLODipine (NORVASC) 2.5 MG tablet Take 2.5 mg by mouth daily. 02/11/16  Yes Historical Provider, MD  atorvastatin (LIPITOR) 20 MG tablet Take 1 tablet (20 mg total) by mouth daily at 6 PM. Patient taking differently: Take 20 mg by mouth daily with breakfast.  12/26/14  Yes Delfina Redwood, MD  clopidogrel (PLAVIX) 75 MG tablet Take 1 tablet (75 mg total) by mouth daily. 12/26/14  Yes Delfina Redwood, MD  Coenzyme Q10 (COQ10) 100 MG CAPS Take 100 mg by mouth daily. Reported on 11/10/2015   Yes Historical Provider, MD  cyanocobalamin 500 MCG tablet Take 500 mcg by mouth daily.   Yes Historical Provider, MD  dexamethasone (DECADRON) 4 MG tablet Take 8 mg by mouth as directed. 8 mg the day after chemo for 2 days following chemotherapy 12/04/15  Yes Historical Provider, MD    dorzolamide (TRUSOPT) 2 % ophthalmic solution Place 1 drop into both eyes 2 (two) times daily.   Yes Historical Provider, MD  finasteride (PROSCAR) 5 MG tablet Take 5 mg by mouth daily. 12/06/14  Yes Historical Provider, MD  latanoprost (XALATAN) 0.005 % ophthalmic solution Place 1 drop into both eyes at bedtime.   Yes Historical Provider, MD  lidocaine-prilocaine (EMLA) cream Apply 1 application topically as needed. Apply generously over the Mediport 45 minutes prior to chemotherapy. 11/24/15  Yes Cammie Sickle, MD  magnesium oxide (MAG-OX) 400 MG tablet Take 400 mg by mouth daily.   Yes Historical Provider, MD  metoprolol succinate (TOPROL-XL) 25 MG 24 hr tablet TAKE ONE TABLET (25 MG TOTAL) BY MOUTH DAILY. 04/19/15  Yes Historical Provider, MD  ondansetron (ZOFRAN) 8 MG tablet Take 1 tablet (8 mg total) by mouth every 8 (eight) hours as needed for nausea or vomiting (start 3 days; after chemo). 11/24/15  Yes Cammie Sickle, MD  prochlorperazine (COMPAZINE) 10 MG tablet Take 1 tablet (10 mg total) by mouth every 6 (six) hours as needed for nausea or vomiting. 11/24/15  Yes Cammie Sickle, MD  terazosin (HYTRIN) 10 MG capsule Take 10 mg by mouth at bedtime.   Yes Historical Provider, MD  timolol (TIMOPTIC) 0.5 % ophthalmic solution Place 1 drop into both eyes 2 (two) times daily. 10/27/14  Yes Historical Provider, MD  Vitamin D, Ergocalciferol, (DRISDOL) 50000 UNITS CAPS Take 50,000 Units by mouth every 7 (seven) days.   Yes Historical Provider, MD  lansoprazole (PREVACID) 30 MG capsule Take 1 capsule (30 mg total) by mouth daily at 12 noon. Patient not taking: Reported on 02/23/2016 11/16/15   Noreene Filbert, MD  predniSONE (DELTASONE) 20 MG tablet Take 1 tablet (20 mg total) by mouth daily with breakfast. Patient not taking: Reported on 02/23/2016 02/01/16   Cammie Sickle, MD  sucralfate (CARAFATE) 1 GM/10ML suspension Take 10 mLs (1 g total) by mouth 4 (four) times daily -  with  meals and at bedtime. Patient not taking: Reported on 02/07/2016 01/31/16   Cammie Sickle, MD    Physical Exam: Patient Vitals for the past 24 hrs:  BP Temp Temp src Pulse Resp SpO2 Height Weight  02/23/16 2052 (!) 181/64 97.9 F (36.6 C) Oral 73 16 100 % 6\' 2"  (1.88 m) 87.9 kg (193 lb 12.6 oz)    1. General:  in No Acute distress 2. Psychological: Alert and  Oriented 3. Head/ENT:     Dry Mucous Membranes  Head Non traumatic, neck supple                           Poor Dentition 4. SKIN:   decreased Skin turgor,  Skin clean Dry and intact no rash 5. Heart: Regular rate and rhythm no  Murmur, Rub or gallop 6. Lungs:  no wheezes or crackles   7. Abdomen: Soft,  non-tender, Non distended 8. Lower extremities: no clubbing, cyanosis, or edema 9. Neurologically Grossly intact, moving all 4 extremities equally  10. MSK: Normal range of motion   body mass index is 24.88 kg/m.  Labs on Admission:   Labs on Admission: I have personally reviewed following labs and imaging studies  CBC: No results for input(s): WBC, NEUTROABS, HGB, HCT, MCV, PLT in the last 168 hours. Basic Metabolic Panel: No results for input(s): NA, K, CL, CO2, GLUCOSE, BUN, CREATININE, CALCIUM, MG, PHOS in the last 168 hours. GFR: Estimated Creatinine Clearance: 44.7 mL/min (by C-G formula based on SCr of 1.48 mg/dL (H)). Liver Function Tests: No results for input(s): AST, ALT, ALKPHOS, BILITOT, PROT, ALBUMIN in the last 168 hours. No results for input(s): LIPASE, AMYLASE in the last 168 hours. No results for input(s): AMMONIA in the last 168 hours. Coagulation Profile: No results for input(s): INR, PROTIME in the last 168 hours. Cardiac Enzymes: No results for input(s): CKTOTAL, CKMB, CKMBINDEX, TROPONINI in the last 168 hours. BNP (last 3 results) No results for input(s): PROBNP in the last 8760 hours. HbA1C: No results for input(s): HGBA1C in the last 72 hours. CBG: No results  for input(s): GLUCAP in the last 168 hours. Lipid Profile: No results for input(s): CHOL, HDL, LDLCALC, TRIG, CHOLHDL, LDLDIRECT in the last 72 hours. Thyroid Function Tests: No results for input(s): TSH, T4TOTAL, FREET4, T3FREE, THYROIDAB in the last 72 hours. Anemia Panel: No results for input(s): VITAMINB12, FOLATE, FERRITIN, TIBC, IRON, RETICCTPCT in the last 72 hours. Urine analysis: No results found for: COLORURINE, APPEARANCEUR, LABSPEC, PHURINE, GLUCOSEU, HGBUR, BILIRUBINUR, KETONESUR, PROTEINUR, UROBILINOGEN, NITRITE, LEUKOCYTESUR Sepsis Labs: @LABRCNTIP (procalcitonin:4,lacticidven:4) )No results found for this or any previous visit (from the past 240 hour(s)).     UAnot ordered  Lab Results  Component Value Date   HGBA1C 6.3 (H) 12/25/2014    Estimated Creatinine Clearance: 44.7 mL/min (by C-G formula based on SCr of 1.48 mg/dL (H)).  BNP (last 3 results) No results for input(s): PROBNP in the last 8760 hours.   ECG REPORT  Independently reviewed Rate: 70  Rhythm: NSR ST&T Change: No acute ischemic changes  QTC 407  Filed Weights   02/23/16 2052  Weight: 87.9 kg (193 lb 12.6 oz)     Cultures: No results found for: SDES, SPECREQUEST, CULT, REPTSTATUS   Radiological Exams on Admission: No results found.  Chart has been reviewed    Assessment/Plan   82 y.o. male with medical history significant of locally advanced esophageal cancer, CK D,  CVA, gout, DVT of her right arm, HLD, HTN, macular degeneration, subclavian artery stenosis being admitted for new diagnosis of pulmonary embolism  Present on Admission: . Pulmonary embolus (HCC) - monitor on telemetry obtain Dopplers to evaluate for any peripheral DVT to evaluate for clot burden, obtain echo to evaluate for right heart strain. Obtain troponin to risk stratify. Continue Lovenox for now. Case management consult to help with medication acquisition for long-term anticoagulation. Marland Kitchen BPH (benign prostatic  hyperplasia) - stable hold home medications for now to allow permissive hypertension .  CKD (chronic kidney disease), stage III stable follow creatinine . Esophageal cancer ([place]) -we'll need to notify oncology pneumonia the patient has been admitted . Essential hypertension permissive hypertension for tonight we'll hold home medications  History of  of CVA currently on Plavix given initiation of anticoagulation will need to monitor for any bleeding complications given marginal platelets count  Other plan as per orders.  DVT prophylaxis:    Lovenox   therapeutic  Code Status:  FULL CODE  as per patient    Family Communication:   Family not  at  Bedside     Disposition Plan:     To home once workup is complete and patient is stable                            case management  consulted                          Consults called: none   Admission status:    inpatient      Level of care     tele      I have spent a total of 56 min on this admission  Latoyia Tecson 02/24/2016, 12:46 AM    [facility]  Pager [phone]   after 2 AM please page floor coverage PA If 7AM-7PM, please contact the day team taking care of the patient  [email or web address]  Password [email or web address]

## 2016-02-24 ENCOUNTER — Inpatient Hospital Stay (HOSPITAL_COMMUNITY): Payer: Medicare Other

## 2016-02-24 DIAGNOSIS — N400 Enlarged prostate without lower urinary tract symptoms: Secondary | ICD-10-CM

## 2016-02-24 DIAGNOSIS — I2699 Other pulmonary embolism without acute cor pulmonale: Principal | ICD-10-CM

## 2016-02-24 DIAGNOSIS — I10 Essential (primary) hypertension: Secondary | ICD-10-CM

## 2016-02-24 DIAGNOSIS — C155 Malignant neoplasm of lower third of esophagus: Secondary | ICD-10-CM

## 2016-02-24 DIAGNOSIS — Z8673 Personal history of transient ischemic attack (TIA), and cerebral infarction without residual deficits: Secondary | ICD-10-CM

## 2016-02-24 DIAGNOSIS — N183 Chronic kidney disease, stage 3 (moderate): Secondary | ICD-10-CM

## 2016-02-24 LAB — COMPREHENSIVE METABOLIC PANEL
ALT: 14 U/L — ABNORMAL LOW (ref 17–63)
AST: 15 U/L (ref 15–41)
Albumin: 2.8 g/dL — ABNORMAL LOW (ref 3.5–5.0)
Alkaline Phosphatase: 70 U/L (ref 38–126)
Anion gap: 6 (ref 5–15)
BILIRUBIN TOTAL: 0.9 mg/dL (ref 0.3–1.2)
BUN: 29 mg/dL — AB (ref 6–20)
CO2: 22 mmol/L (ref 22–32)
Calcium: 8.4 mg/dL — ABNORMAL LOW (ref 8.9–10.3)
Chloride: 110 mmol/L (ref 101–111)
Creatinine, Ser: 1.16 mg/dL (ref 0.61–1.24)
GFR, EST NON AFRICAN AMERICAN: 57 mL/min — AB (ref >60)
Glucose, Bld: 106 mg/dL — ABNORMAL HIGH (ref 65–99)
POTASSIUM: 4.5 mmol/L (ref 3.5–5.1)
Sodium: 138 mmol/L (ref 135–145)
TOTAL PROTEIN: 5.7 g/dL — AB (ref 6.5–8.1)

## 2016-02-24 LAB — CBC
HEMATOCRIT: 26.9 % — AB (ref 39.0–52.0)
HEMOGLOBIN: 9.2 g/dL — AB (ref 13.0–17.0)
MCH: 34.5 pg — ABNORMAL HIGH (ref 26.0–34.0)
MCHC: 34.2 g/dL (ref 30.0–36.0)
MCV: 100.7 fL — AB (ref 78.0–100.0)
Platelets: 102 10*3/uL — ABNORMAL LOW (ref 150–400)
RBC: 2.67 MIL/uL — AB (ref 4.22–5.81)
RDW: 17 % — AB (ref 11.5–15.5)
WBC: 5.3 10*3/uL (ref 4.0–10.5)

## 2016-02-24 LAB — MAGNESIUM: MAGNESIUM: 1.7 mg/dL (ref 1.7–2.4)

## 2016-02-24 LAB — ECHOCARDIOGRAM COMPLETE
Height: 74 in
WEIGHTICAEL: 3100.55 [oz_av]

## 2016-02-24 LAB — GLUCOSE, CAPILLARY: Glucose-Capillary: 105 mg/dL — ABNORMAL HIGH (ref 65–99)

## 2016-02-24 LAB — TROPONIN I

## 2016-02-24 LAB — PHOSPHORUS: PHOSPHORUS: 3.8 mg/dL (ref 2.5–4.6)

## 2016-02-24 LAB — TSH: TSH: 2.446 u[IU]/mL (ref 0.350–4.500)

## 2016-02-24 MED ORDER — TERAZOSIN HCL 5 MG PO CAPS
10.0000 mg | ORAL_CAPSULE | Freq: Once | ORAL | Status: AC
  Administered 2016-02-24: 10 mg via ORAL
  Filled 2016-02-24: qty 2

## 2016-02-24 MED ORDER — AMLODIPINE BESYLATE 5 MG PO TABS
2.5000 mg | ORAL_TABLET | Freq: Every day | ORAL | Status: DC
  Administered 2016-02-24 – 2016-02-26 (×3): 2.5 mg via ORAL
  Filled 2016-02-24 (×3): qty 1

## 2016-02-24 MED ORDER — METOPROLOL SUCCINATE ER 25 MG PO TB24
25.0000 mg | ORAL_TABLET | Freq: Every day | ORAL | Status: DC
  Administered 2016-02-24 – 2016-02-26 (×3): 25 mg via ORAL
  Filled 2016-02-24 (×3): qty 1

## 2016-02-24 MED ORDER — MORPHINE SULFATE (PF) 2 MG/ML IV SOLN
2.0000 mg | INTRAVENOUS | Status: DC | PRN
  Administered 2016-02-24: 2 mg via INTRAVENOUS
  Filled 2016-02-24: qty 1

## 2016-02-24 MED ORDER — FINASTERIDE 5 MG PO TABS
5.0000 mg | ORAL_TABLET | Freq: Every day | ORAL | Status: DC
  Administered 2016-02-24 – 2016-02-26 (×3): 5 mg via ORAL
  Filled 2016-02-24 (×3): qty 1

## 2016-02-24 NOTE — Progress Notes (Signed)
Informed by CCMD that patient had 10beat run of [place]. Vital signs obtained and stable. Patient is resting/sleeping comfortably. Jonette Eva, NP paged. Will continue to monitor patient.

## 2016-02-24 NOTE — Progress Notes (Signed)
PROGRESS NOTE    Marcus [name]  [id number] DOB: [birth year] DOA: 02/23/2016 PCP: Chesley Noon, MD   No chief complaint on file.   Brief Narrative:  HPI On 02/23/2016 by Dr. Phillips Hay Marcus [name] is a 82 y.o. male with medical history significant of locally advanced esophageal cancer, CKD,  stroke, gout, DVT of her right arm, HLD, HTN, macular degeneration, subclavian artery stenosis   Presented with shortness of breath and chest pain since this a.m. and he will copy faint was worsening his chest radiating to his right arm and radiated across his whole chest felt a sharp had mild increased swelling his lower extremities. Chest pain was better by being still and not taking deep breaths. He never had similar chest pain in the past. patient went to be evaluated at at hospital. He was found to have elevated d-dimer CT scan angiogram of his chest was done showing P with moderate burden. Patient was started on Lovenox and 2 L nasal cannula. And was transferred to [facility] long.  Patient denies recent travel. Chest pain was worse with inspiration. He fevers or chills. He has been feeling weak and losing weight. Reports some bruising no melena no black stools.   Assessment & Plan   Pulmonary embolism/DVT -Found at outside facility on CT angiogram -Lower extremity Doppler: Short segment of DVT in the right proximal posterior tibial vein -Pending echocardiogram -Placed on Lovenox -Case management consulted for drug coverage -Will likely transition to oral anticoagulation in 24 hours  Chest discomfort/pain -Likely secondary to pulmonary embolism -Appears to be pleuritic in nature -Troponin unremarkable 2  Chronic Anemia -Hemoglobin currently 9.2 -baseline 11 -continue to monitor closely  BPH -Will restart Proscar  Chronic kidney disease, stage III -creatinine currently stable, continue to monitor BMP  Esophageal cancer -Follow up with oncology  Essential hypertension    -Will restart patient's home medications, metoprolol and amlodipine  NSVT -Overnight, patient had 10 beat run of V. tach however was asymptomatic -Pending echocardiogram -Potassium 4.5, Magnesium 1.7 (will replace magnesium)  DVT Prophylaxis  Lovenox  Code Status: Full  Family Communication: None at bedside  Disposition Plan: Admitted. Pending echocardiogram and transition to oral anticoagulation  Consultants None  Procedures  Lower ext doppler  Antibiotics   Anti-infectives    None      Subjective:   Marcus [name] seen and examined today.  Patient continues to complain of pain located in his chest, which worsens with deep breathing and cough. Denies any shortness of breath at this time. Denies any abdominal pain, nausea or vomiting, diarrhea or constipation.  Objective:   Vitals:   02/23/16 2052 02/24/16 0533  BP: (!) 181/64 133/62  Pulse: 73 65  Resp: 16 16  Temp: 97.9 F (36.6 C) 98.2 F (36.8 C)  TempSrc: Oral Oral  SpO2: 100% 97%  Weight: 87.9 kg (193 lb 12.6 oz)   Height: 6\' 2"  (1.88 m)     Intake/Output Summary (Last 24 hours) at 02/24/16 1145 Last data filed at 02/24/16 0931  Gross per 24 hour  Intake           968.33 ml  Output              500 ml  Net           468.33 ml   Filed Weights   02/23/16 2052  Weight: 87.9 kg (193 lb 12.6 oz)    Exam  General: Well developed, well nourished, NAD, appears stated age  HEENT: NCAT,  mucous membranes moist.   Cardiovascular: S1 S2 auscultated, RRR, no murmurs  Respiratory: Clear to auscultation bilaterally with equal chest rise  Abdomen: Soft, nontender, nondistended, + bowel sounds  Extremities: warm dry without cyanosis clubbing or edema  Neuro: AAOx3, nonfocal  Skin: Without rashes exudates, various nodules on knees, elbows, hands  Psych: Normal affect and demeanor with intact judgement and insight   Data Reviewed: I have personally reviewed following labs and imaging  studies  CBC:  Recent Labs Lab 02/24/16 0435  WBC 5.3  HGB 9.2*  HCT 26.9*  MCV 100.7*  PLT [id number]*   Basic Metabolic Panel:  Recent Labs Lab 02/24/16 0435  NA 138  K 4.5  CL 110  CO2 22  GLUCOSE 106*  BUN 29*  CREATININE 1.16  CALCIUM 8.4*  MG 1.7  PHOS 3.8   GFR: Estimated Creatinine Clearance: 57.1 mL/min (by C-G formula based on SCr of 1.16 mg/dL). Liver Function Tests:  Recent Labs Lab 02/24/16 0435  AST 15  ALT 14*  ALKPHOS 70  BILITOT 0.9  PROT 5.7*  ALBUMIN 2.8*   No results for input(s): LIPASE, AMYLASE in the last 168 hours. No results for input(s): AMMONIA in the last 168 hours. Coagulation Profile: No results for input(s): INR, PROTIME in the last 168 hours. Cardiac Enzymes:  Recent Labs Lab 02/23/16 2345 02/24/16 0435  TROPONINI <0.03 <0.03   BNP (last 3 results) No results for input(s): PROBNP in the last 8760 hours. HbA1C: No results for input(s): HGBA1C in the last 72 hours. CBG:  Recent Labs Lab 02/24/16 0740  GLUCAP 105*   Lipid Profile: No results for input(s): CHOL, HDL, LDLCALC, TRIG, CHOLHDL, LDLDIRECT in the last 72 hours. Thyroid Function Tests:  Recent Labs  02/24/16 0435  TSH 2.446   Anemia Panel: No results for input(s): VITAMINB12, FOLATE, FERRITIN, TIBC, IRON, RETICCTPCT in the last 72 hours. Urine analysis: No results found for: COLORURINE, APPEARANCEUR, LABSPEC, PHURINE, GLUCOSEU, HGBUR, BILIRUBINUR, KETONESUR, PROTEINUR, UROBILINOGEN, NITRITE, LEUKOCYTESUR Sepsis Labs: @LABRCNTIP (procalcitonin:4,lacticidven:4)  )No results found for this or any previous visit (from the past 240 hour(s)).    Radiology Studies: No results found.   Scheduled Meds: . amLODipine  2.5 mg Oral Daily  . atorvastatin  20 mg Oral Q breakfast  . clopidogrel  75 mg Oral Q breakfast  . dorzolamide  1 drop Both Eyes BID  . enoxaparin (LOVENOX) injection  1 mg/kg Subcutaneous BID  . finasteride  5 mg Oral Daily  .  latanoprost  1 drop Both Eyes QHS  . magnesium oxide  400 mg Oral Daily  . mouth rinse  15 mL Mouth Rinse BID  . metoprolol succinate  25 mg Oral Daily  . sodium chloride flush  3 mL Intravenous Q12H  . timolol  1 drop Both Eyes BID   Continuous Infusions:    LOS: 1 day   Time Spent in minutes   30 minutes  Marcus [name] D.O. on 02/24/2016 at 11:45 AM  Between 7am to 7pm - Pager - [phone]  After 7pm go to [email or web address] - password [email or web address]  And look for the night coverage person covering for me after hours  [facility] Office  [phone]

## 2016-02-24 NOTE — Progress Notes (Signed)
Echocardiogram 2D Echocardiogram has been performed.  Marcus [name] 02/24/2016, 10:06 AM

## 2016-02-24 NOTE — Progress Notes (Signed)
*  PRELIMINARY RESULTS* Vascular Ultrasound Lower extremity venous duplex has been completed.  Preliminary findings: Short segment of DVT noted in the right proximal posterior tibial veins. No DVT LLE. Large left baker's cyst noted.   Gave results to [place], RN  Landry Mellow, RDMS, RVT  02/24/2016, 8:41 AM

## 2016-02-25 DIAGNOSIS — I82401 Acute embolism and thrombosis of unspecified deep veins of right lower extremity: Secondary | ICD-10-CM

## 2016-02-25 LAB — CBC
HEMATOCRIT: 24.9 % — AB (ref 39.0–52.0)
HEMOGLOBIN: 8.2 g/dL — AB (ref 13.0–17.0)
MCH: 33.5 pg (ref 26.0–34.0)
MCHC: 32.9 g/dL (ref 30.0–36.0)
MCV: 101.6 fL — ABNORMAL HIGH (ref 78.0–100.0)
Platelets: 99 10*3/uL — ABNORMAL LOW (ref 150–400)
RBC: 2.45 MIL/uL — ABNORMAL LOW (ref 4.22–5.81)
RDW: 16.6 % — AB (ref 11.5–15.5)
WBC: 4.8 10*3/uL (ref 4.0–10.5)

## 2016-02-25 LAB — IRON AND TIBC
IRON: 32 ug/dL — AB (ref 45–182)
Saturation Ratios: 13 % — ABNORMAL LOW (ref 17.9–39.5)
TIBC: 242 ug/dL — ABNORMAL LOW (ref 250–450)
UIBC: 210 ug/dL

## 2016-02-25 LAB — BASIC METABOLIC PANEL
ANION GAP: 6 (ref 5–15)
BUN: 24 mg/dL — ABNORMAL HIGH (ref 6–20)
CALCIUM: 8.1 mg/dL — AB (ref 8.9–10.3)
CHLORIDE: 110 mmol/L (ref 101–111)
CO2: 22 mmol/L (ref 22–32)
Creatinine, Ser: 1.03 mg/dL (ref 0.61–1.24)
GFR calc non Af Amer: >60 mL/min (ref >60)
Glucose, Bld: 93 mg/dL (ref 65–99)
Potassium: 4.2 mmol/L (ref 3.5–5.1)
Sodium: 138 mmol/L (ref 135–145)

## 2016-02-25 LAB — FOLATE: FOLATE: 11.9 ng/mL (ref >5.9)

## 2016-02-25 LAB — VITAMIN B12: VITAMIN B 12: 903 pg/mL (ref 180–914)

## 2016-02-25 LAB — OCCULT BLOOD X 1 CARD TO LAB, STOOL: Fecal Occult Bld: NEGATIVE

## 2016-02-25 LAB — FERRITIN: Ferritin: 500 ng/mL — ABNORMAL HIGH (ref 24–336)

## 2016-02-25 LAB — RETICULOCYTES
RBC.: 2.78 MIL/uL — ABNORMAL LOW (ref 4.22–5.81)
RETIC COUNT ABSOLUTE: 111.2 10*3/uL (ref 19.0–186.0)
Retic Ct Pct: 4 % — ABNORMAL HIGH (ref 0.4–3.1)

## 2016-02-25 MED ORDER — APIXABAN 5 MG PO TABS
10.0000 mg | ORAL_TABLET | Freq: Two times a day (BID) | ORAL | Status: DC
  Administered 2016-02-25 – 2016-02-26 (×3): 10 mg via ORAL
  Filled 2016-02-25 (×3): qty 2

## 2016-02-25 MED ORDER — TERAZOSIN HCL 5 MG PO CAPS
10.0000 mg | ORAL_CAPSULE | Freq: Every day | ORAL | Status: DC
  Administered 2016-02-25: 10 mg via ORAL
  Filled 2016-02-25: qty 2

## 2016-02-25 MED ORDER — APIXABAN 5 MG PO TABS: 5.0000 mg | ORAL_TABLET | Freq: Two times a day (BID) | ORAL | Status: DC

## 2016-02-25 NOTE — Discharge Instructions (Signed)
Information on my medicine - ELIQUIS (apixaban)  This medication education was reviewed with me or my healthcare representative as part of my discharge preparation.  The pharmacist that spoke with me during my hospital stay was:  Hershal Coria, [facility]  Why was Eliquis prescribed for you? Eliquis was prescribed to treat blood clots that may have been found in the veins of your legs (deep vein thrombosis) or in your lungs (pulmonary embolism) and to reduce the risk of them occurring again.  What do You need to know about Eliquis ? The starting dose is 10 mg (two 5 mg tablets) taken TWICE daily for the FIRST SEVEN (7) DAYS, then on (enter date)  03/03/16  the dose is reduced to ONE 5 mg tablet taken TWICE daily.  Eliquis may be taken with or without food.   Try to take the dose about the same time in the morning and in the evening. If you have difficulty swallowing the tablet whole please discuss with your pharmacist how to take the medication safely.  Take Eliquis exactly as prescribed and DO NOT stop taking Eliquis without talking to the doctor who prescribed the medication.  Stopping may increase your risk of developing a new blood clot.  Refill your prescription before you run out.  After discharge, you should have regular check-up appointments with your healthcare provider that is prescribing your Eliquis.    What do you do if you miss a dose? If a dose of ELIQUIS is not taken at the scheduled time, take it as soon as possible on the same day and twice-daily administration should be resumed. The dose should not be doubled to make up for a missed dose.  Important Safety Information A possible side effect of Eliquis is bleeding. You should call your healthcare provider right away if you experience any of the following: ? Bleeding from an injury or your nose that does not stop. ? Unusual colored urine (red or dark brown) or unusual colored stools (red or black). ? Unusual bruising for  unknown reasons. ? A serious fall or if you hit your head (even if there is no bleeding).  Some medicines may interact with Eliquis and might increase your risk of bleeding or clotting while on Eliquis. To help avoid this, consult your healthcare provider or pharmacist prior to using any new prescription or non-prescription medications, including herbals, vitamins, non-steroidal anti-inflammatory drugs (NSAIDs) and supplements.  This website has more information on Eliquis (apixaban): http://[email or web address].com/eliquis/home

## 2016-02-25 NOTE — Progress Notes (Signed)
PROGRESS NOTE    Marcus [name]  [id number] DOB: 1931-10-21 DOA: 02/23/2016 PCP: Chesley Noon, MD   No chief complaint on file.   Brief Narrative:  HPI On 02/23/2016 by Dr. Phillips Hay Marcus [name] is a 82 y.o. male with medical history significant of locally advanced esophageal cancer, CKD,  stroke, gout, DVT of her right arm, HLD, HTN, macular degeneration, subclavian artery stenosis   Presented with shortness of breath and chest pain since this a.m. and he will copy faint was worsening his chest radiating to his right arm and radiated across his whole chest felt a sharp had mild increased swelling his lower extremities. Chest pain was better by being still and not taking deep breaths. He never had similar chest pain in the past. patient went to be evaluated at at hospital. He was found to have elevated d-dimer CT scan angiogram of his chest was done showing P with moderate burden. Patient was started on Lovenox and 2 L nasal cannula. And was transferred to [facility] long.  Patient denies recent travel. Chest pain was worse with inspiration. He fevers or chills. He has been feeling weak and losing weight. Reports some bruising no melena no black stools.   Assessment & Plan   Pulmonary embolism/DVT -Found at outside facility on CT angiogram -Lower extremity Doppler: Short segment of DVT in the right proximal posterior tibial vein -Echocardiogram: EF Q000111Q, grade 1 diastolic dysfunction -Placed on Lovenox -Case management consulted for drug coverage -Will transition to Eliquis today  Chest discomfort/pain -Likely secondary to pulmonary embolism -Appears to be pleuritic in nature -Troponin unremarkable 2  Chronic Anemia -Hemoglobin currently 8.2 -baseline 11 -Given drop in hemoglobin, will order anemia panel and FOBT -Will continue to monitor CBC as patient receiving anticoagulation  BPH -Will restart hytrin -Continue proscar  Chronic kidney disease, stage  III -creatinine currently stable, continue to monitor BMP  Esophageal cancer -Follow up with oncology  Essential hypertension   -Will restart patient's home medications, metoprolol and amlodipine  NSVT -Overnight, patient had 10 beat run of V. tach however was asymptomatic -Pending echocardiogram -Potassium 4.2, Magnesium 1.7 (replaced)  DVT Prophylaxis  Lovenox --> Eliquis  Code Status: Full  Family Communication: Wife at bedside  Disposition Plan: Admitted. Transitioned to oral anticoagulation today. Hemoglobin dropped, continue to monitor.  Consultants None  Procedures  Lower ext doppler Echocardiogram  Antibiotics   Anti-infectives    None      Subjective:   Marcus [name] seen and examined today.  Patient feels overwhelmed.  Continues to complain of pain in the chest, worsened by deep breathing.  Denies  Current shortness of breath, abdominal pain, nausea or vomiting, diarrhea or constipation.  Objective:   Vitals:   02/24/16 0533 02/24/16 1341 02/24/16 2229 02/25/16 0648  BP: 133/62 135/60 122/64 131/61  Pulse: 65 63 66 68  Resp: 16 18 18 18   Temp: 98.2 F (36.8 C) 97.9 F (36.6 C) 99.2 F (37.3 C) 98.4 F (36.9 C)  TempSrc: Oral Oral Oral Oral  SpO2: 97% 99% 95% 90%  Weight:      Height:        Intake/Output Summary (Last 24 hours) at 02/25/16 1050 Last data filed at 02/25/16 0500  Gross per 24 hour  Intake                0 ml  Output              400 ml  Net             -  400 ml   Filed Weights   02/23/16 2052  Weight: 87.9 kg (193 lb 12.6 oz)    Exam  General: Well developed, well nourished, NAD  HEENT: NCAT,  mucous membranes moist.   Cardiovascular: S1 S2 auscultated, RRR, no murmurs  Respiratory: Clear to auscultation bilaterally with equal chest rise  Abdomen: Soft, nontender, nondistended, + bowel sounds  Extremities: warm dry without cyanosis clubbing or edema  Neuro: AAOx3, nonfocal  Skin: Without rashes exudates,  various nodules on knees, elbows, hands  Psych: Appropriate mood and affect   Data Reviewed: I have personally reviewed following labs and imaging studies  CBC:  Recent Labs Lab 02/24/16 0435 02/25/16 0500  WBC 5.3 4.8  HGB 9.2* 8.2*  HCT 26.9* 24.9*  MCV 100.7* 101.6*  PLT 102* 99*   Basic Metabolic Panel:  Recent Labs Lab 02/24/16 0435 02/25/16 0500  NA 138 138  K 4.5 4.2  CL 110 110  CO2 22 22  GLUCOSE 106* 93  BUN 29* 24*  CREATININE 1.16 1.03  CALCIUM 8.4* 8.1*  MG 1.7  --   PHOS 3.8  --    GFR: Estimated Creatinine Clearance: 64.3 mL/min (by C-G formula based on SCr of 1.03 mg/dL). Liver Function Tests:  Recent Labs Lab 02/24/16 0435  AST 15  ALT 14*  ALKPHOS 70  BILITOT 0.9  PROT 5.7*  ALBUMIN 2.8*   No results for input(s): LIPASE, AMYLASE in the last 168 hours. No results for input(s): AMMONIA in the last 168 hours. Coagulation Profile: No results for input(s): INR, PROTIME in the last 168 hours. Cardiac Enzymes:  Recent Labs Lab 02/23/16 2345 02/24/16 0435 02/24/16 1355  TROPONINI <0.03 <0.03 <0.03   BNP (last 3 results) No results for input(s): PROBNP in the last 8760 hours. HbA1C: No results for input(s): HGBA1C in the last 72 hours. CBG:  Recent Labs Lab 02/24/16 0740  GLUCAP 105*   Lipid Profile: No results for input(s): CHOL, HDL, LDLCALC, TRIG, CHOLHDL, LDLDIRECT in the last 72 hours. Thyroid Function Tests:  Recent Labs  02/24/16 0435  TSH 2.446   Anemia Panel:  Recent Labs  02/25/16 0825  RETICCTPCT 4.0*   Urine analysis: No results found for: COLORURINE, APPEARANCEUR, LABSPEC, PHURINE, GLUCOSEU, HGBUR, BILIRUBINUR, KETONESUR, PROTEINUR, UROBILINOGEN, NITRITE, LEUKOCYTESUR Sepsis Labs: @LABRCNTIP (procalcitonin:4,lacticidven:4)  )No results found for this or any previous visit (from the past 240 hour(s)).    Radiology Studies: No results found.   Scheduled Meds: . amLODipine  2.5 mg Oral Daily  .  apixaban  10 mg Oral BID   Followed by  . [START ON 03/03/2016] apixaban  5 mg Oral BID  . atorvastatin  20 mg Oral Q breakfast  . clopidogrel  75 mg Oral Q breakfast  . dorzolamide  1 drop Both Eyes BID  . finasteride  5 mg Oral Daily  . latanoprost  1 drop Both Eyes QHS  . magnesium oxide  400 mg Oral Daily  . mouth rinse  15 mL Mouth Rinse BID  . metoprolol succinate  25 mg Oral Daily  . sodium chloride flush  3 mL Intravenous Q12H  . terazosin  10 mg Oral QHS  . timolol  1 drop Both Eyes BID   Continuous Infusions:    LOS: 2 days   Time Spent in minutes   30 minutes  Eliyas Suddreth D.O. on 02/25/2016 at 10:50 AM  Between 7am to 7pm - Pager - [phone]  After 7pm go to [email or web address] - password [facility]  And look for the night coverage person covering for me after hours  [facility] Office  [phone]

## 2016-02-25 NOTE — Progress Notes (Signed)
Initial Nutrition Assessment  DOCUMENTATION CODES:   Non-severe (moderate) malnutrition in context of chronic illness  INTERVENTION:   Provide Boost Plus TID, each supplement provides 360 kcal and 14 grams of protein. Encourage PO intake RD to continue to monitor  NUTRITION DIAGNOSIS:   Increased nutrient needs related to cancer and cancer related treatments as evidenced by estimated needs.  GOAL:   Patient will meet greater than or equal to 90% of their needs  MONITOR:   PO intake, Supplement acceptance, Labs, Weight trends, I & O's  REASON FOR ASSESSMENT:   Malnutrition Screening Tool    ASSESSMENT:   82 y.o. male with medical history significant of locally advanced esophageal cancer, CKD,  stroke, gout, DVT of her right arm, HLD, HTN, macular degeneration, subclavian artery stenosis  Pt in room with family at bedside. Pt states his appetite is poor but he did manage to consume 1/2 a bagel, and 1/3 of his egg whites and potatoes this morning for breakfast. Pt states he is planning to order lunch soon. Pt drinks Boost supplements at home, at least 2-3 times daily. Pt denies swallowing or chewing issues. Pt does endorse taste changes, where he could not taste any of his food. He states this is improving slightly.   Per weight history ,pt has lost 18 lb since 7/17 (9% wt loss x 2 months, significant for time frame). Pt with mild muscle depletion in temporal region.   Medications: MAG-OX tablet daily Labs reviewed: Mg/Phos WNL  Diet Order:  Diet Heart Room service appropriate? Yes; Fluid consistency: Thin  Skin:  Reviewed, no issues  Last BM:  9/21  Height:   Ht Readings from Last 1 Encounters:  02/23/16 6\' 2"  (1.88 m)    Weight:   Wt Readings from Last 1 Encounters:  02/23/16 193 lb 12.6 oz (87.9 kg)    Ideal Body Weight:  86.3 kg  BMI:  Body mass index is 24.88 kg/m.  Estimated Nutritional Needs:   Kcal:  2400-2600  Protein:  120-130g  Fluid:   2.4L/day  EDUCATION NEEDS:   No education needs identified at this time  Clayton Bibles, MS, RD, LDN Pager: [phone] After Hours Pager: [phone]

## 2016-02-25 NOTE — Progress Notes (Signed)
[place] for Enoxaparin --> apixaban Indication: pulmonary embolus  Allergies  Allergen Reactions  . Codeine Other (See Comments) and Anaphylaxis    Swollen tongue, numb around mouth, slurred speech Swollen tongue, numb around mouth, slurred speech  . Allopurinol Other (See Comments)    Hand pain  . Simvastatin Other (See Comments)    myalgias  . Uloric [Febuxostat] Other (See Comments)    Stomach pain    Patient Measurements: Height: 6\' 2"  (188 cm) Weight: 193 lb 12.6 oz (87.9 kg) IBW/kg (Calculated) : 82.2  Vital Signs: Temp: 98.4 F (36.9 C) (09/24 0648) Temp Source: Oral (09/24 0648) BP: 131/61 (09/24 0648) Pulse Rate: 68 (09/24 0648)  Labs:  Recent Labs  02/23/16 2345 02/24/16 0435 02/24/16 1355 02/25/16 0500  HGB  --  9.2*  --  8.2*  HCT  --  26.9*  --  24.9*  PLT  --  102*  --  99*  CREATININE  --  1.16  --  1.03  TROPONINI <0.03 <0.03 <0.03  --     Estimated Creatinine Clearance: 64.3 mL/min (by C-G formula based on SCr of 1.03 mg/dL).   Medical History: Past Medical History:  Diagnosis Date  . Aphakia of right eye   . Arthritis   . Cancer ([place])    SKIN  . Enlarged prostate without lower urinary tract symptoms (luts)   . Esophageal cancer ([place])   . GERD (gastroesophageal reflux disease)   . Glaucoma   . Gout   . History of CVA (cerebrovascular accident)    Cerebral infarction Nov. 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 History of Doppler ultrasound    dopplers revealed mild left ICA stenosis which has remained stable, occluded left subclavian artery with retrograde left vertebral filling and moderately severe right subclavian artery stenosis. he has no symptoms of upper extermity claudication or subclavian steal symptoms.   . History of DVT (deep vein thrombosis)    right arm  . History of stress test 09/02/2009   abnormal myocardial perfusion scan demonstrating an attenuation defect in the  inferior region of the myocardium. No ischemia or infarct/scar is seen in the remaining myocardium. No prior study available for comparison. Abnormal myocardial study EF 79%  . Hyperlipemia   . Hypertension   . Macular degeneration   . Stroke ([place])   . Subclavian artery stenosis [facility])     Assessment: 82 y.o. male admitted 02/23/2016 for acute PE, right LE DVT.  Started on Lovenox and now transitioning to apixaban.   CBC: Hgb 8.2, plts 99K but appear stable  AKI resolving, CrCl~68 ml/min  Last dose of treatment Lovenox given at ~2100 last night  Noted also on Plavix for h/o CVA.  Plan:  Apixaban 10 mg BID x 7 days then 5 mg BID.  Will provide education prior to discharge.   Hershal Coria, PharmD, BCPS Pager: [phone] 02/25/2016 9:08 AM

## 2016-02-26 DIAGNOSIS — E440 Moderate protein-calorie malnutrition: Secondary | ICD-10-CM | POA: Insufficient documentation

## 2016-02-26 LAB — BASIC METABOLIC PANEL
ANION GAP: 6 (ref 5–15)
BUN: 23 mg/dL — ABNORMAL HIGH (ref 6–20)
CHLORIDE: 109 mmol/L (ref 101–111)
CO2: 22 mmol/L (ref 22–32)
CREATININE: 1.05 mg/dL (ref 0.61–1.24)
Calcium: 8.3 mg/dL — ABNORMAL LOW (ref 8.9–10.3)
GFR calc non Af Amer: >60 mL/min (ref >60)
Glucose, Bld: 94 mg/dL (ref 65–99)
POTASSIUM: 4.2 mmol/L (ref 3.5–5.1)
SODIUM: 137 mmol/L (ref 135–145)

## 2016-02-26 LAB — CBC
HEMATOCRIT: 26.3 % — AB (ref 39.0–52.0)
HEMOGLOBIN: 8.7 g/dL — AB (ref 13.0–17.0)
MCH: 34.3 pg — ABNORMAL HIGH (ref 26.0–34.0)
MCHC: 33.1 g/dL (ref 30.0–36.0)
MCV: 103.5 fL — ABNORMAL HIGH (ref 78.0–100.0)
Platelets: 110 10*3/uL — ABNORMAL LOW (ref 150–400)
RBC: 2.54 MIL/uL — AB (ref 4.22–5.81)
RDW: 16.6 % — ABNORMAL HIGH (ref 11.5–15.5)
WBC: 4.5 10*3/uL (ref 4.0–10.5)

## 2016-02-26 LAB — MAGNESIUM: MAGNESIUM: 1.8 mg/dL (ref 1.7–2.4)

## 2016-02-26 MED ORDER — APIXABAN 5 MG PO TABS: 5.0000 mg | ORAL_TABLET | Freq: Two times a day (BID) | ORAL | 0 refills | Status: AC

## 2016-02-26 MED ORDER — HEPARIN SOD (PORK) LOCK FLUSH 100 UNIT/ML IV SOLN
500.0000 [IU] | INTRAVENOUS | Status: AC | PRN
  Administered 2016-02-26: 500 [IU]

## 2016-02-26 MED ORDER — APIXABAN 5 MG PO TABS: 10.0000 mg | ORAL_TABLET | Freq: Two times a day (BID) | ORAL | 0 refills | Status: DC

## 2016-02-26 NOTE — Discharge Summary (Signed)
Physician Discharge Summary  Marcus [name] [id number] DOB: 1932/03/01 DOA: 02/23/2016  PCP: Chesley Noon, MD  Admit date: 02/23/2016 Discharge date: 02/26/2016  Time spent: 45 minutes  Recommendations for Outpatient Follow-up:  Patient will be discharged to home.  Patient will need to follow up with primary care provider within one week of discharge, monitor CBC.  Follow up with oncologist. Patient should continue medications as prescribed.  Patient should follow a heart healthy diet.   Discharge Diagnoses:  Pulmonary embolism/DVT Chest discomfort/pain Chronic Anemia BPH Chronic kidney disease, stage III Esophageal cancer Essential hypertension   NSVT  Discharge Condition: Stable  Diet recommendation: Heart healthy  Filed Weights   02/23/16 2052  Weight: 87.9 kg (193 lb 12.6 oz)    History of present illness:  On 02/23/2016 by Dr. Karin Golden Connellis a 82 y.o.malewith medical history significant of locally advanced esophageal cancer, CKD, stroke, gout, DVT of her right arm, HLD, HTN, macular degeneration, subclavian artery stenosis  Presented with shortness of breath and chest pain sincethis a.m. and he will copy faint was worsening his chest radiating to his right arm and radiated across his whole chest felt a sharp had mild increased swelling his lower extremities. Chest pain was better by being still and not taking deep breaths. He never had similar chest pain in the past.patient went to be evaluated at at hospital. He was found to haveelevated d-dimer CT scan angiogram of his chest was done showing P with moderate burden. Patient was started on Lovenox and 2 L nasal cannula. And was transferred to [facility] long. Patient denies recent travel. Chest pain was worse with inspiration. He fevers or chills. He has been feeling weak and losing weight. Reports some bruising no melena no black stools.  Hospital Course:  Pulmonary embolism/DVT -Found at  outside facility on CT angiogram -Lower extremity Doppler: Short segment of DVT in the right proximal posterior tibial vein -Echocardiogram: EF Q000111Q, grade 1 diastolic dysfunction -Placed on Lovenox -Case management consulted for drug coverage -Transitioned to Eliquis -Of note, patient's chart states he has had DVT in the right arm, however, he and his wife state this is not true.  Chest discomfort/pain -Likely secondary to pulmonary embolism -Appears to be pleuritic in nature, is currently approving -Troponin unremarkable 3  Chronic Anemia -Hemoglobin currently 8.7 -baseline 11 -Anemia panel showed mild iron def (32), but adequate iron stores  BPH -Continue proscar and hytrin  Chronic kidney disease, stage III -creatinine currently stable, currently 1.05  Esophageal cancer -Follow up with oncology  Essential hypertension   -Will restart patient's home medications, metoprolol and amlodipine  NSVT -Overnight, patient had 10 beat run of V. tach however was asymptomatic -Pending echocardiogram -Potassium 4.2, Magnesium 1.8 (continue Mag-ox)  Consultants None  Procedures  Lower ext doppler Echocardiogram  Discharge Exam: Vitals:   02/25/16 2200 02/26/16 0500  BP: (!) 117/55 (!) 110/52  Pulse: 63 64  Resp: 18 16  Temp: 99 F (37.2 C) 98.5 F (36.9 C)   Exam  General: Well developed, well nourished, NAD  HEENT: NCAT,  mucous membranes moist.   Cardiovascular: S1 S2 auscultated, RRR, no murmurs  Respiratory: Clear to auscultation bilaterally with equal chest rise  Abdomen: Soft, nontender, nondistended, + bowel sounds  Extremities: warm dry without cyanosis clubbing or edema  Neuro: AAOx3, nonfocal  Skin: Without rashes exudates, various nodules on knees, elbows, hands  Psych: Appropriate mood and affect, pleasant  Discharge Instructions Discharge Instructions    Discharge instructions  Complete by:  As directed    Patient will be  discharged to home.  Patient will need to follow up with primary care provider within one week of discharge, monitor CBC.  Follow up with oncologist. Patient should continue medications as prescribed.  Patient should follow a heart healthy diet.     Current Discharge Medication List    START taking these medications   Details  !! apixaban (ELIQUIS) 5 MG TABS tablet Take 2 tablets (10 mg total) by mouth 2 (two) times daily. Qty: 14 tablet, Refills: 0    !! apixaban (ELIQUIS) 5 MG TABS tablet Take 1 tablet (5 mg total) by mouth 2 (two) times daily. Start on 03/03/2016 Qty: 60 tablet, Refills: 0     !! - Potential duplicate medications found. Please discuss with provider.    CONTINUE these medications which have NOT CHANGED   Details  amLODipine (NORVASC) 2.5 MG tablet Take 2.5 mg by mouth daily. Refills: 0    atorvastatin (LIPITOR) 20 MG tablet Take 1 tablet (20 mg total) by mouth daily at 6 PM. Qty: 30 tablet, Refills: 1    clopidogrel (PLAVIX) 75 MG tablet Take 1 tablet (75 mg total) by mouth daily. Qty: 30 tablet, Refills: 1    Coenzyme Q10 (COQ10) 100 MG CAPS Take 100 mg by mouth daily. Reported on 11/10/2015   Associated Diagnoses: Lacunar infarct, acute (HCC)    cyanocobalamin 500 MCG tablet Take 500 mcg by mouth daily.    dexamethasone (DECADRON) 4 MG tablet Take 8 mg by mouth as directed. 8 mg the day after chemo for 2 days following chemotherapy Refills: 0    dorzolamide (TRUSOPT) 2 % ophthalmic solution Place 1 drop into both eyes 2 (two) times daily.    finasteride (PROSCAR) 5 MG tablet Take 5 mg by mouth daily. Refills: 10    latanoprost (XALATAN) 0.005 % ophthalmic solution Place 1 drop into both eyes at bedtime.    lidocaine-prilocaine (EMLA) cream Apply 1 application topically as needed. Apply generously over the Mediport 45 minutes prior to chemotherapy. Qty: 30 g, Refills: 0    magnesium oxide (MAG-OX) 400 MG tablet Take 400 mg by mouth daily.    metoprolol  succinate (TOPROL-XL) 25 MG 24 hr tablet TAKE ONE TABLET (25 MG TOTAL) BY MOUTH DAILY.   Associated Diagnoses: Lacunar infarct, acute (HCC)    ondansetron (ZOFRAN) 8 MG tablet Take 1 tablet (8 mg total) by mouth every 8 (eight) hours as needed for nausea or vomiting (start 3 days; after chemo). Qty: 40 tablet, Refills: 1    prochlorperazine (COMPAZINE) 10 MG tablet Take 1 tablet (10 mg total) by mouth every 6 (six) hours as needed for nausea or vomiting. Qty: 40 tablet, Refills: 1    terazosin (HYTRIN) 10 MG capsule Take 10 mg by mouth at bedtime.    timolol (TIMOPTIC) 0.5 % ophthalmic solution Place 1 drop into both eyes 2 (two) times daily. Refills: 3    Vitamin D, Ergocalciferol, (DRISDOL) 50000 UNITS CAPS Take 50,000 Units by mouth every 7 (seven) days.    lansoprazole (PREVACID) 30 MG capsule Take 1 capsule (30 mg total) by mouth daily at 12 noon. Qty: 30 capsule, Refills: 11      STOP taking these medications     predniSONE (DELTASONE) 20 MG tablet      sucralfate (CARAFATE) 1 GM/10ML suspension        Allergies  Allergen Reactions  . Codeine Other (See Comments) and Anaphylaxis  Swollen tongue, numb around mouth, slurred speech Swollen tongue, numb around mouth, slurred speech  . Allopurinol Other (See Comments)    Hand pain  . Simvastatin Other (See Comments)    myalgias  . Uloric [Febuxostat] Other (See Comments)    Stomach pain   Follow-up Information    BADGER,MICHAEL C, MD. Schedule an appointment as soon as possible for a visit in 1 week(s).   Specialty:  Family Medicine Why:  Hospital follow up Contact information: [address] [place] [address] [phone]            The results of significant diagnostics from this hospitalization (including imaging, microbiology, ancillary and laboratory) are listed below for reference.    Significant Diagnostic Studies: No results found.  Microbiology: No results found for this or any previous  visit (from the past 240 hour(s)).   Labs: Basic Metabolic Panel:  Recent Labs Lab 02/24/16 0435 02/25/16 0500 02/26/16 0545  NA 138 138 137  K 4.5 4.2 4.2  CL 110 110 109  CO2 22 22 22   GLUCOSE 106* 93 94  BUN 29* 24* 23*  CREATININE 1.16 1.03 1.05  CALCIUM 8.4* 8.1* 8.3*  MG 1.7  --  1.8  PHOS 3.8  --   --    Liver Function Tests:  Recent Labs Lab 02/24/16 0435  AST 15  ALT 14*  ALKPHOS 70  BILITOT 0.9  PROT 5.7*  ALBUMIN 2.8*   No results for input(s): LIPASE, AMYLASE in the last 168 hours. No results for input(s): AMMONIA in the last 168 hours. CBC:  Recent Labs Lab 02/24/16 0435 02/25/16 0500 02/26/16 0545  WBC 5.3 4.8 4.5  HGB 9.2* 8.2* 8.7*  HCT 26.9* 24.9* 26.3*  MCV 100.7* 101.6* 103.5*  PLT 102* 99* 110*   Cardiac Enzymes:  Recent Labs Lab 02/23/16 2345 02/24/16 0435 02/24/16 1355  TROPONINI <0.03 <0.03 <0.03   BNP: BNP (last 3 results) No results for input(s): BNP in the last 8760 hours.  ProBNP (last 3 results) No results for input(s): PROBNP in the last 8760 hours.  CBG:  Recent Labs Lab 02/24/16 0740  GLUCAP 105*       Signed:  Cristal Ford  [facility] 02/26/2016, 10:25 AM

## 2016-02-28 ENCOUNTER — Ambulatory Visit: Payer: Self-pay | Admitting: Radiation Oncology

## 2016-03-18 ENCOUNTER — Encounter: Payer: Self-pay | Admitting: Radiation Oncology

## 2016-03-18 ENCOUNTER — Ambulatory Visit
Admission: RE | Admit: 2016-03-18 | Discharge: 2016-03-18 | Disposition: A | Discharge status: Home / Self Care | Payer: Medicare Other | Source: Ambulatory Visit | Attending: Radiation Oncology | Admitting: Radiation Oncology

## 2016-03-18 VITALS — BP 127/62 | HR 75 | Temp 98.2°F | Resp 20 | Wt 199.1 lb

## 2016-03-18 DIAGNOSIS — C155 Malignant neoplasm of lower third of esophagus: Secondary | ICD-10-CM | POA: Diagnosis not present

## 2016-03-18 DIAGNOSIS — Z9221 Personal history of antineoplastic chemotherapy: Secondary | ICD-10-CM | POA: Insufficient documentation

## 2016-03-18 DIAGNOSIS — R630 Anorexia: Secondary | ICD-10-CM | POA: Diagnosis not present

## 2016-03-18 DIAGNOSIS — R1310 Dysphagia, unspecified: Secondary | ICD-10-CM | POA: Diagnosis not present

## 2016-03-18 DIAGNOSIS — Z923 Personal history of irradiation: Secondary | ICD-10-CM | POA: Insufficient documentation

## 2016-03-18 NOTE — Progress Notes (Signed)
Radiation Oncology Follow up Note  Name: Marcus [name]   Date:   03/18/2016 MRN:  [id number] DOB: 03/12/1932    This 82 y.o. male presents to the clinic today for one-month follow-up status post chemoradiation for distal third esophageal cancer. Tumor clinically was a stage II lesion.  REFERRING PROVIDER: Chesley Noon, MD  HPI: Patient is a 82 year old male now out 1 month having completed combined modality treatment for a distal third esophagus adenocarcinoma. He is seen today in routine follow-up and is doing fairly well. He states his dysphasia has improved he still has poor appetite although he believes his weight is stable. He is scheduled for a PET CT scan next week.Marland Kitchen He is currently on boost supplements. He also has completed consolidative chemotherapy.  COMPLICATIONS OF TREATMENT: none  FOLLOW UP COMPLIANCE: keeps appointments   PHYSICAL EXAM:  BP 127/62   Pulse 75   Temp 98.2 F (36.8 C)   Resp 20   Wt 199 lb 1.2 oz (90.3 kg)   BMI 25.56 kg/m  Well-developed well-nourished patient in NAD. HEENT reveals PERLA, EOMI, discs not visualized.  Oral cavity is clear. No oral mucosal lesions are identified. Neck is clear without evidence of cervical or supraclavicular adenopathy. Lungs are clear to A&P. Cardiac examination is essentially unremarkable with regular rate and rhythm without murmur rub or thrill. Abdomen is benign with no organomegaly or masses noted. Motor sensory and DTR levels are equal and symmetric in the upper and lower extremities. Cranial nerves II through XII are grossly intact. Proprioception is intact. No peripheral adenopathy or edema is identified. No motor or sensory levels are noted. Crude visual fields are within normal range.  RADIOLOGY RESULTS: PET scan will be reviewed independently when complete later this week  PLAN: Present time patient appears to be doing well. I've interest in seeing a PET CT results and he may need further upper endoscopy in  the near future. He continues close follow-up care with medical oncology. I'm please was overall progress. I've instructed him to try to eat and advance his diet somewhat. I will see him back in 3-4 months for follow-up.  I would like to take this opportunity to thank you for allowing me to participate in the care of your patient.Armstead Peaks., MD

## 2016-03-20 ENCOUNTER — Encounter
Admission: RE | Admit: 2016-03-20 | Discharge: 2016-03-20 | Disposition: A | Discharge status: Home / Self Care | Payer: Medicare Other | Source: Ambulatory Visit | Attending: Internal Medicine | Admitting: Internal Medicine

## 2016-03-20 DIAGNOSIS — C155 Malignant neoplasm of lower third of esophagus: Secondary | ICD-10-CM | POA: Diagnosis not present

## 2016-03-20 LAB — GLUCOSE, CAPILLARY: Glucose-Capillary: 92 mg/dL (ref 65–99)

## 2016-03-20 MED ORDER — FLUDEOXYGLUCOSE F - 18 (FDG) INJECTION
12.4300 | Freq: Once | INTRAVENOUS | Status: AC | PRN
  Administered 2016-03-20: 12.43 via INTRAVENOUS

## 2016-03-26 NOTE — Progress Notes (Signed)
[facility] OFFICE PROGRESS NOTE  Patient Care Team: Chesley Noon, MD as PCP - General (Family Medicine)  No matching staging information was found for the patient.   Oncology History   # June 2017- Localized [? Stage II; no EUS]Distal Esophagus Adeno CA [Bx Dr.Elliot]; PET- no distant Mets; July 5th 2017-concurrent Carbo-Taxol- RT Vespasian.Sprague ]  # LLL nodule- monitor for now [PET June 2017]   # CKD [creat 1.4-1.7]; Hx of stroke [facility] 2016]  June 2017-MOLECULAR STUDIES- Her 2- IHC-NEG; MMR-12/18/2015- ordered.      Esophageal cancer ([place])   11/10/2015 Initial Diagnosis    Esophageal cancer (HCC)- distal esophagus- non-circumferential partially obstructing ulcerating mass- biopsy positive for mod diff  adenocarcinoma [Dr.Elliot]       Malignant neoplasm of lower third of esophagus ([place])   12/18/2015 Initial Diagnosis    Malignant neoplasm of lower third of esophagus (HCC)         INTERVAL HISTORY:  Marcus [name] 82 y.o.  male pleasant patient above history of Locally advanced esophageal cancer- currently s/p definitive chemoradiation is here for further evaluation and discussion of his PET scan results. He completed his XRT on January 16, 2016. He currently feels well and is asymptomatic. He has no neurologic complaints. He denies any fevers. He has no chest pain or shortness of breath. He denies any difficulty swallowing. Appetite has improved and he has gained some weight in the interim. He denies any nausea, vomiting, constipation, or diarrhea. Patient offers no further specific complaints today.   REVIEW OF SYSTEMS:  A complete 10 point review of system is done which is negative except mentioned above/history of present illness.   PAST MEDICAL HISTORY :  Past Medical History:  Diagnosis Date  . Aphakia of right eye   . Arthritis   . Cancer ([place])    SKIN  . Enlarged prostate without lower urinary tract symptoms (luts)   . Esophageal cancer ([place])   .  GERD (gastroesophageal reflux disease)   . Glaucoma   . Gout   . History of CVA (cerebrovascular accident)    Cerebral infarction Nov. 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 History of Doppler ultrasound    dopplers revealed mild left ICA stenosis which has remained stable, occluded left subclavian artery with retrograde left vertebral filling and moderately severe right subclavian artery stenosis. he has no symptoms of upper extermity claudication or subclavian steal symptoms.   . History of DVT (deep vein thrombosis)    right arm  . History of stress test 09/02/2009   abnormal myocardial perfusion scan demonstrating an attenuation defect in the inferior region of the myocardium. No ischemia or infarct/scar is seen in the remaining myocardium. No prior study available for comparison. Abnormal myocardial study EF 79%  . Hyperlipemia   . Hypertension   . Macular degeneration   . Stroke ([place])   . Subclavian artery stenosis (HCC)     PAST SURGICAL HISTORY :   Past Surgical History:  Procedure Laterality Date  . CATARACT EXTRACTION    . COLONOSCOPY  2002  . ESOPHAGOGASTRODUODENOSCOPY (EGD) WITH PROPOFOL N/A 11/10/2015   Procedure: ESOPHAGOGASTRODUODENOSCOPY (EGD) WITH PROPOFOL;  Surgeon: Manya Silvas, MD;  Location: [facility] ENDOSCOPY;  Service: Endoscopy;  Laterality: N/A;  . FRACTURE SURGERY    . MOHS SURGERY    . PERIPHERAL VASCULAR CATHETERIZATION N/A 11/29/2015   Procedure: Glori Luis Cath Insertion;  Surgeon: Algernon Huxley, MD;  Location: [facility] CV LAB;  Service: Cardiovascular;  Laterality: N/A;    FAMILY HISTORY :   Family History  Problem Relation Age of Onset  . Heart disease Brother   . Cancer - Colon Sister   . Cancer Sister   . Other Brother     CABG    SOCIAL HISTORY:   Social History  Substance Use Topics  . Smoking status: Former Smoker    Packs/day: 1.00    Years: 40.00    Types: Cigarettes    Quit date: 06/03/1993  . Smokeless tobacco: Never Used   . Alcohol use No    ALLERGIES:  is allergic to codeine; allopurinol; simvastatin; and uloric [febuxostat].  MEDICATIONS:  Current Outpatient Prescriptions  Medication Sig Dispense Refill  . amLODipine (NORVASC) 2.5 MG tablet Take 2.5 mg by mouth daily.  0  . apixaban (ELIQUIS) 5 MG TABS tablet Take 1 tablet (5 mg total) by mouth 2 (two) times daily. Start on 03/03/2016 60 tablet 0  . atorvastatin (LIPITOR) 20 MG tablet Take 1 tablet (20 mg total) by mouth daily at 6 PM. (Patient taking differently: Take 20 mg by mouth daily with breakfast. ) 30 tablet 1  . clopidogrel (PLAVIX) 75 MG tablet Take 1 tablet (75 mg total) by mouth daily. 30 tablet 1  . Coenzyme Q10 (COQ10) 100 MG CAPS Take 100 mg by mouth daily. Reported on 11/10/2015    . cyanocobalamin 500 MCG tablet Take 500 mcg by mouth daily.    . dorzolamide (TRUSOPT) 2 % ophthalmic solution Place 1 drop into both eyes 2 (two) times daily.    . finasteride (PROSCAR) 5 MG tablet Take 5 mg by mouth daily.  10  . latanoprost (XALATAN) 0.005 % ophthalmic solution Place 1 drop into both eyes at bedtime.    . lidocaine-prilocaine (EMLA) cream Apply 1 application topically as needed. Apply generously over the Mediport 45 minutes prior to chemotherapy. 30 g 0  . magnesium oxide (MAG-OX) 400 MG tablet Take 400 mg by mouth daily.    . metoprolol succinate (TOPROL-XL) 25 MG 24 hr tablet TAKE ONE TABLET (25 MG TOTAL) BY MOUTH DAILY.    Marland Kitchen timolol (TIMOPTIC) 0.5 % ophthalmic solution Place 1 drop into both eyes 2 (two) times daily.  3  . Vitamin D, Ergocalciferol, (DRISDOL) 50000 UNITS CAPS Take 50,000 Units by mouth every 7 (seven) days.    Marland Kitchen apixaban (ELIQUIS) 5 MG TABS tablet Take 2 tablets (10 mg total) by mouth 2 (two) times daily. 14 tablet 0  . dexamethasone (DECADRON) 4 MG tablet Take 8 mg by mouth as directed. 8 mg the day after chemo for 2 days following chemotherapy  0  . ondansetron (ZOFRAN) 8 MG tablet Take 1 tablet (8 mg total) by mouth every  8 (eight) hours as needed for nausea or vomiting (start 3 days; after chemo). (Patient not taking: Reported on 03/27/2016) 40 tablet 1  . prochlorperazine (COMPAZINE) 10 MG tablet Take 1 tablet (10 mg total) by mouth every 6 (six) hours as needed for nausea or vomiting. (Patient not taking: Reported on 03/27/2016) 40 tablet 1   No current facility-administered medications for this visit.     PHYSICAL EXAMINATION: ECOG PERFORMANCE STATUS: 2 - Symptomatic, <50% confined to bed  BP 127/65 (BP Location: Right Arm, Patient Position: Sitting)   Pulse 64   Temp (!) 95.8 F (35.4 C) (Tympanic)   Resp 17   Ht 6' 2"  (1.88 m)   Wt 198 lb 3.1 oz (89.9 kg)  BMI 25.45 kg/m   Filed Weights   03/27/16 1110  Weight: 198 lb 3.1 oz (89.9 kg)    GENERAL: Well-nourished well-developed; Alert, no distress and comfortable.  Accompanied by his wife. He walks with a cane. EYES: no pallor or icterus OROPHARYNX: no thrush or ulceration;   NECK: supple, no masses felt LYMPH:  no palpable lymphadenopathy in the cervical, axillary or inguinal regions LUNGS: clear to auscultation and  No wheeze or crackles HEART/CVS: regular rate & rhythm and no murmurs; No lower extremity edema ABDOMEN:abdomen soft, non-tender and normal bowel sounds Musculoskeletal:no cyanosis of digits and no clubbing  PSYCH: alert & oriented x 3 with fluent speech NEURO: no focal motor/sensory deficits SKIN:  no rashes; improving scabbing lesions noted chest upper extremity and left lower extremity.  LABORATORY DATA:  I have reviewed the data as listed    Component Value Date/Time   NA 137 03/27/2016 1034   K 4.2 03/27/2016 1034   CL 109 03/27/2016 1034   CO2 19 (L) 03/27/2016 1034   GLUCOSE 114 (H) 03/27/2016 1034   BUN 27 (H) 03/27/2016 1034   CREATININE 1.48 (H) 03/27/2016 1034   CALCIUM 8.8 (L) 03/27/2016 1034   PROT 7.1 03/27/2016 1034   ALBUMIN 3.4 (L) 03/27/2016 1034   AST 19 03/27/2016 1034   ALT 8 (L) 03/27/2016  1034   ALKPHOS 100 03/27/2016 1034   BILITOT 0.4 03/27/2016 1034   GFRNONAA 42 (L) 03/27/2016 1034   GFRAA 49 (L) 03/27/2016 1034    No results found for: SPEP, UPEP  Lab Results  Component Value Date   WBC 4.3 03/27/2016   NEUTROABS 3.2 03/27/2016   HGB 10.4 (L) 03/27/2016   HCT 30.2 (L) 03/27/2016   MCV 100.5 (H) 03/27/2016   PLT 157 03/27/2016      Chemistry      Component Value Date/Time   NA 137 03/27/2016 1034   K 4.2 03/27/2016 1034   CL 109 03/27/2016 1034   CO2 19 (L) 03/27/2016 1034   BUN 27 (H) 03/27/2016 1034   CREATININE 1.48 (H) 03/27/2016 1034      Component Value Date/Time   CALCIUM 8.8 (L) 03/27/2016 1034   ALKPHOS 100 03/27/2016 1034   AST 19 03/27/2016 1034   ALT 8 (L) 03/27/2016 1034   BILITOT 0.4 03/27/2016 1034       RADIOGRAPHIC STUDIES:   PET scan (March 20, 2016):  Decreased size and hypermetabolic activity of distal esophageal mass compared to previous study.  No definite evidence of metastatic disease.  Stable left lower lobe nodular density with minimal low-grade FDG uptake, likely postinflammatory in etiology. Recommend followup by chest CT in 6 months.  I have personally reviewed the radiological images as listed and agreed with the findings in the report. No results found.   ASSESSMENT & PLAN:   Localized esophageal cancer; no evidence of any local adenopathy. Patient last received chemotherapy and XRT in August 2017. He did not receive any consolidative treatment. PET scan results reviewed independently and reported as above with improvement of disease and no definitive evidence of metastatic disease.   # weight loss- patient's weight is now improving.  # Nose bleeds/ on plavix -resolved patient does not complain of this today.  # CKD- creat 1.48- stable.   # follow up in 3 months with repeat laboratory work and imaging with CT scan. Patient will then follow-up for M.D. evaluation one to 2 days later.      Orders Placed This  Encounter  Procedures  . CT SOFT TISSUE NECK W CONTRAST    Standing Status:   Future    Standing Expiration Date:   06/27/2017    Order Specific Question:   If indicated for the ordered procedure, I authorize the administration of contrast media per Radiology protocol    Answer:   Yes    Order Specific Question:   Reason for Exam (SYMPTOM  OR DIAGNOSIS REQUIRED)    Answer:   follow up esophageal cancer    Order Specific Question:   Preferred imaging location?    Answer:   [facility]  . CT Chest W Contrast    Standing Status:   Future    Standing Expiration Date:   03/27/2017    Order Specific Question:   If indicated for the ordered procedure, I authorize the administration of contrast media per Radiology protocol    Answer:   Yes    Order Specific Question:   Reason for Exam (SYMPTOM  OR DIAGNOSIS REQUIRED)    Answer:   follow up esophageal cancer    Order Specific Question:   Preferred imaging location?    Answer:   [facility]  . CBC with Differential/Platelet    Standing Status:   Future    Standing Expiration Date:   03/27/2017  . Comprehensive metabolic panel    Standing Status:   Future    Standing Expiration Date:   03/27/2017   All questions were answered. The patient knows to call the clinic with any problems, questions or concerns.      Lloyd Huger, MD 03/31/2016 4:19 PM

## 2016-03-27 ENCOUNTER — Encounter: Payer: Self-pay | Admitting: Oncology

## 2016-03-27 ENCOUNTER — Inpatient Hospital Stay (HOSPITAL_BASED_OUTPATIENT_CLINIC_OR_DEPARTMENT_OTHER): Payer: Medicare Other | Admitting: Oncology

## 2016-03-27 ENCOUNTER — Inpatient Hospital Stay: Payer: Medicare Other

## 2016-03-27 ENCOUNTER — Inpatient Hospital Stay: Payer: Medicare Other | Attending: Oncology

## 2016-03-27 ENCOUNTER — Other Ambulatory Visit: Payer: Self-pay

## 2016-03-27 VITALS — BP 127/65 | HR 64 | Temp 95.8°F | Resp 17 | Ht 74.0 in | Wt 198.2 lb

## 2016-03-27 DIAGNOSIS — Z79899 Other long term (current) drug therapy: Secondary | ICD-10-CM | POA: Diagnosis not present

## 2016-03-27 DIAGNOSIS — C155 Malignant neoplasm of lower third of esophagus: Secondary | ICD-10-CM

## 2016-03-27 DIAGNOSIS — N189 Chronic kidney disease, unspecified: Secondary | ICD-10-CM | POA: Insufficient documentation

## 2016-03-27 DIAGNOSIS — Z452 Encounter for adjustment and management of vascular access device: Secondary | ICD-10-CM | POA: Insufficient documentation

## 2016-03-27 DIAGNOSIS — M109 Gout, unspecified: Secondary | ICD-10-CM | POA: Diagnosis not present

## 2016-03-27 DIAGNOSIS — E785 Hyperlipidemia, unspecified: Secondary | ICD-10-CM | POA: Insufficient documentation

## 2016-03-27 DIAGNOSIS — M1990 Unspecified osteoarthritis, unspecified site: Secondary | ICD-10-CM | POA: Insufficient documentation

## 2016-03-27 DIAGNOSIS — H3530 Unspecified macular degeneration: Secondary | ICD-10-CM | POA: Diagnosis not present

## 2016-03-27 DIAGNOSIS — H409 Unspecified glaucoma: Secondary | ICD-10-CM | POA: Insufficient documentation

## 2016-03-27 DIAGNOSIS — N400 Enlarged prostate without lower urinary tract symptoms: Secondary | ICD-10-CM | POA: Insufficient documentation

## 2016-03-27 DIAGNOSIS — R040 Epistaxis: Secondary | ICD-10-CM | POA: Insufficient documentation

## 2016-03-27 DIAGNOSIS — Z7901 Long term (current) use of anticoagulants: Secondary | ICD-10-CM | POA: Insufficient documentation

## 2016-03-27 DIAGNOSIS — Z8673 Personal history of transient ischemic attack (TIA), and cerebral infarction without residual deficits: Secondary | ICD-10-CM | POA: Diagnosis not present

## 2016-03-27 DIAGNOSIS — I129 Hypertensive chronic kidney disease with stage 1 through stage 4 chronic kidney disease, or unspecified chronic kidney disease: Secondary | ICD-10-CM | POA: Diagnosis not present

## 2016-03-27 DIAGNOSIS — Z87891 Personal history of nicotine dependence: Secondary | ICD-10-CM | POA: Insufficient documentation

## 2016-03-27 DIAGNOSIS — K219 Gastro-esophageal reflux disease without esophagitis: Secondary | ICD-10-CM | POA: Insufficient documentation

## 2016-03-27 DIAGNOSIS — Z923 Personal history of irradiation: Secondary | ICD-10-CM

## 2016-03-27 DIAGNOSIS — Z86718 Personal history of other venous thrombosis and embolism: Secondary | ICD-10-CM | POA: Insufficient documentation

## 2016-03-27 DIAGNOSIS — Z95828 Presence of other vascular implants and grafts: Secondary | ICD-10-CM

## 2016-03-27 LAB — CBC WITH DIFFERENTIAL/PLATELET
BASOS ABS: 0 10*3/uL (ref 0–0.1)
BASOS PCT: 1 %
EOS ABS: 0.1 10*3/uL (ref 0–0.7)
EOS PCT: 3 %
HCT: 30.2 % — ABNORMAL LOW (ref 40.0–52.0)
Hemoglobin: 10.4 g/dL — ABNORMAL LOW (ref 13.0–18.0)
Lymphocytes Relative: 14 %
Lymphs Abs: 0.6 10*3/uL — ABNORMAL LOW (ref 1.0–3.6)
MCH: 34.7 pg — ABNORMAL HIGH (ref 26.0–34.0)
MCHC: 34.5 g/dL (ref 32.0–36.0)
MCV: 100.5 fL — ABNORMAL HIGH (ref 80.0–100.0)
MONO ABS: 0.4 10*3/uL (ref 0.2–1.0)
Monocytes Relative: 10 %
Neutro Abs: 3.2 10*3/uL (ref 1.4–6.5)
Neutrophils Relative %: 72 %
PLATELETS: 157 10*3/uL (ref 150–440)
RBC: 3.01 MIL/uL — ABNORMAL LOW (ref 4.40–5.90)
RDW: 15.3 % — AB (ref 11.5–14.5)
WBC: 4.3 10*3/uL (ref 3.8–10.6)

## 2016-03-27 LAB — COMPREHENSIVE METABOLIC PANEL
ALBUMIN: 3.4 g/dL — AB (ref 3.5–5.0)
ALT: 8 U/L — ABNORMAL LOW (ref 17–63)
ANION GAP: 9 (ref 5–15)
AST: 19 U/L (ref 15–41)
Alkaline Phosphatase: 100 U/L (ref 38–126)
BUN: 27 mg/dL — AB (ref 6–20)
CHLORIDE: 109 mmol/L (ref 101–111)
CO2: 19 mmol/L — ABNORMAL LOW (ref 22–32)
Calcium: 8.8 mg/dL — ABNORMAL LOW (ref 8.9–10.3)
Creatinine, Ser: 1.48 mg/dL — ABNORMAL HIGH (ref 0.61–1.24)
GFR calc Af Amer: 49 mL/min — ABNORMAL LOW (ref >60)
GFR, EST NON AFRICAN AMERICAN: 42 mL/min — AB (ref >60)
GLUCOSE: 114 mg/dL — AB (ref 65–99)
POTASSIUM: 4.2 mmol/L (ref 3.5–5.1)
Sodium: 137 mmol/L (ref 135–145)
Total Bilirubin: 0.4 mg/dL (ref 0.3–1.2)
Total Protein: 7.1 g/dL (ref 6.5–8.1)

## 2016-03-27 MED ORDER — SODIUM CHLORIDE 0.9% FLUSH
10.0000 mL | INTRAVENOUS | Status: DC | PRN
  Administered 2016-03-27: 10 mL via INTRAVENOUS
  Filled 2016-03-27: qty 10

## 2016-03-27 MED ORDER — HEPARIN SOD (PORK) LOCK FLUSH 100 UNIT/ML IV SOLN
500.0000 [IU] | Freq: Once | INTRAVENOUS | Status: AC
  Administered 2016-03-27: 500 [IU] via INTRAVENOUS

## 2016-03-27 NOTE — Progress Notes (Signed)
Here for PET results

## 2016-03-28 ENCOUNTER — Telehealth: Payer: Self-pay | Admitting: Internal Medicine

## 2016-03-28 LAB — CEA: CEA: 1.4 ng/mL (ref 0.0–4.7)

## 2016-03-28 NOTE — Telephone Encounter (Signed)
Spoke to pt's wife re: PEt scan results; recommend Holding off chemo; recommend EGD in one month; recommend calling Dr. Dillard [place] office for appt.   Pt needs to follow up with me in 2 weeks post EGD with me; cbc; Cea;Please schedule.   I also spoke to [place]. He will call pt call.

## 2016-04-02 ENCOUNTER — Other Ambulatory Visit: Payer: Self-pay | Admitting: *Deleted

## 2016-04-02 DIAGNOSIS — C155 Malignant neoplasm of lower third of esophagus: Secondary | ICD-10-CM

## 2016-04-11 ENCOUNTER — Telehealth: Payer: Self-pay

## 2016-04-11 NOTE — Telephone Encounter (Signed)
  Oncology Nurse Navigator Documentation Received call from Ms. Dina Rich asking if Mr. Dina Rich can consume shellfish now. Last chemo was in Sept. He eats all shellfish cooked. Instructed it is fine for him to consume cooked shellfish. Navigator Location: [facility] (04/11/16 1400)   )Navigator Encounter Type: Telephone (04/11/16 1400) Telephone: Incoming Call;Education (04/11/16 1400)                                                  Time Spent with Patient: 15 (04/11/16 1400)

## 2016-04-29 ENCOUNTER — Telehealth: Payer: Self-pay

## 2016-04-29 ENCOUNTER — Encounter
Admission: RE | Disposition: A | Discharge status: Home / Self Care | Payer: Self-pay | Source: Ambulatory Visit | Attending: Unknown Physician Specialty

## 2016-04-29 ENCOUNTER — Encounter: Payer: Self-pay | Admitting: *Deleted

## 2016-04-29 ENCOUNTER — Ambulatory Visit: Payer: Medicare Other | Admitting: Anesthesiology

## 2016-04-29 ENCOUNTER — Ambulatory Visit
Admission: RE | Admit: 2016-04-29 | Discharge: 2016-04-29 | Disposition: A | Discharge status: Home / Self Care | Payer: Medicare Other | Source: Ambulatory Visit | Attending: Unknown Physician Specialty | Admitting: Unknown Physician Specialty

## 2016-04-29 DIAGNOSIS — Z87891 Personal history of nicotine dependence: Secondary | ICD-10-CM | POA: Diagnosis not present

## 2016-04-29 DIAGNOSIS — Z79899 Other long term (current) drug therapy: Secondary | ICD-10-CM | POA: Insufficient documentation

## 2016-04-29 DIAGNOSIS — Z85828 Personal history of other malignant neoplasm of skin: Secondary | ICD-10-CM | POA: Insufficient documentation

## 2016-04-29 DIAGNOSIS — Z08 Encounter for follow-up examination after completed treatment for malignant neoplasm: Secondary | ICD-10-CM | POA: Diagnosis not present

## 2016-04-29 DIAGNOSIS — E785 Hyperlipidemia, unspecified: Secondary | ICD-10-CM | POA: Insufficient documentation

## 2016-04-29 DIAGNOSIS — Z923 Personal history of irradiation: Secondary | ICD-10-CM | POA: Insufficient documentation

## 2016-04-29 DIAGNOSIS — I69851 Hemiplegia and hemiparesis following other cerebrovascular disease affecting right dominant side: Secondary | ICD-10-CM | POA: Insufficient documentation

## 2016-04-29 DIAGNOSIS — K2950 Unspecified chronic gastritis without bleeding: Secondary | ICD-10-CM | POA: Insufficient documentation

## 2016-04-29 DIAGNOSIS — I10 Essential (primary) hypertension: Secondary | ICD-10-CM | POA: Diagnosis not present

## 2016-04-29 DIAGNOSIS — K219 Gastro-esophageal reflux disease without esophagitis: Secondary | ICD-10-CM | POA: Diagnosis not present

## 2016-04-29 DIAGNOSIS — Z86718 Personal history of other venous thrombosis and embolism: Secondary | ICD-10-CM | POA: Diagnosis not present

## 2016-04-29 DIAGNOSIS — H409 Unspecified glaucoma: Secondary | ICD-10-CM | POA: Diagnosis not present

## 2016-04-29 DIAGNOSIS — Z9221 Personal history of antineoplastic chemotherapy: Secondary | ICD-10-CM | POA: Insufficient documentation

## 2016-04-29 DIAGNOSIS — I739 Peripheral vascular disease, unspecified: Secondary | ICD-10-CM | POA: Diagnosis not present

## 2016-04-29 DIAGNOSIS — Z7902 Long term (current) use of antithrombotics/antiplatelets: Secondary | ICD-10-CM | POA: Diagnosis not present

## 2016-04-29 DIAGNOSIS — Z7901 Long term (current) use of anticoagulants: Secondary | ICD-10-CM | POA: Insufficient documentation

## 2016-04-29 HISTORY — PX: ESOPHAGOGASTRODUODENOSCOPY (EGD) WITH PROPOFOL: SHX5813

## 2016-04-29 SURGERY — ESOPHAGOGASTRODUODENOSCOPY (EGD) WITH PROPOFOL
Anesthesia: General

## 2016-04-29 MED ORDER — PROPOFOL 10 MG/ML IV BOLUS
INTRAVENOUS | Status: DC | PRN
  Administered 2016-04-29: 100 mg via INTRAVENOUS

## 2016-04-29 MED ORDER — EPHEDRINE SULFATE 50 MG/ML IJ SOLN
INTRAMUSCULAR | Status: DC | PRN
  Administered 2016-04-29: 10 mg via INTRAVENOUS

## 2016-04-29 MED ORDER — MIDAZOLAM HCL 5 MG/5ML IJ SOLN
INTRAMUSCULAR | Status: DC | PRN
  Administered 2016-04-29: 1 mg via INTRAVENOUS

## 2016-04-29 MED ORDER — PHENYLEPHRINE HCL 10 MG/ML IJ SOLN
INTRAMUSCULAR | Status: DC | PRN
  Administered 2016-04-29 (×2): 100 ug via INTRAVENOUS

## 2016-04-29 MED ORDER — SODIUM CHLORIDE 0.9 % IV SOLN
INTRAVENOUS | Status: DC
  Administered 2016-04-29: 11:00:00 via INTRAVENOUS

## 2016-04-29 MED ORDER — FENTANYL CITRATE (PF) 100 MCG/2ML IJ SOLN
INTRAMUSCULAR | Status: DC | PRN
  Administered 2016-04-29: 50 ug via INTRAVENOUS

## 2016-04-29 MED ORDER — LIDOCAINE 2% (20 MG/ML) 5 ML SYRINGE
INTRAMUSCULAR | Status: DC | PRN
  Administered 2016-04-29: 40 mg via INTRAVENOUS

## 2016-04-29 MED ORDER — PROPOFOL 500 MG/50ML IV EMUL
INTRAVENOUS | Status: DC | PRN
  Administered 2016-04-29: 150 ug/kg/min via INTRAVENOUS

## 2016-04-29 MED ORDER — GLYCOPYRROLATE 0.2 MG/ML IJ SOLN
INTRAMUSCULAR | Status: DC | PRN
  Administered 2016-04-29: 0.2 mg via INTRAVENOUS

## 2016-04-29 NOTE — Anesthesia Preprocedure Evaluation (Signed)
Anesthesia Evaluation  Patient identified by MRN, date of birth, ID band  Reviewed: Allergy & Precautions, NPO status , Patient's Chart, lab work & pertinent test results, reviewed documented beta blocker date and time   History of Anesthesia Complications Negative for: history of anesthetic complications  Airway Mallampati: II       Dental  (+) Upper Dentures   Pulmonary neg pulmonary ROS, former smoker,           Cardiovascular hypertension, Pt. on medications and Pt. on home beta blockers + Peripheral Vascular Disease (s/p PE)       Neuro/Psych CVA (right sided weakness and numbeness, upper and lower extremity), Residual Symptoms negative neurological ROS     GI/Hepatic Neg liver ROS, GERD  Medicated,  Endo/Other  negative endocrine ROS  Renal/GU Renal InsufficiencyRenal disease     Musculoskeletal negative musculoskeletal ROS (+)   Abdominal   Peds  Hematology negative hematology ROS (+)   Anesthesia Other Findings   Reproductive/Obstetrics                            Anesthesia Physical Anesthesia Plan  ASA: III  Anesthesia Plan: General   Post-op Pain Management:    Induction: Intravenous  Airway Management Planned: Nasal Cannula  Additional Equipment:   Intra-op Plan:   Post-operative Plan:   Informed Consent: I have reviewed the patients History and Physical, chart, labs and discussed the procedure including the risks, benefits and alternatives for the proposed anesthesia with the patient or authorized representative who has indicated his/her understanding and acceptance.     Plan Discussed with:   Anesthesia Plan Comments:         Anesthesia Quick Evaluation

## 2016-04-29 NOTE — Op Note (Signed)
[facility] Gastroenterology Patient Name: Marcus [name] Procedure Date: 04/29/2016 11:04 AM MRN: [id number] Account #: [id number] Date of Birth: July 11, 1931 Admit Type: Outpatient Age: 82 Room: [facility] ENDO ROOM 4 Gender: Male Note Status: Finalized Procedure:            Upper GI endoscopy Indications:          Follow-up of malignant esophageal adenocarcinoma Providers:            Manya Silvas, MD Referring MD:         Chesley Noon (Referring MD) Medicines:            Propofol per Anesthesia Complications:        No immediate complications. Procedure:            Pre-Anesthesia Assessment:                       - After reviewing the risks and benefits, the patient                        was deemed in satisfactory condition to undergo the                        procedure.                       After obtaining informed consent, the endoscope was                        passed under direct vision. Throughout the procedure,                        the patient's blood pressure, pulse, and oxygen                        saturations were monitored continuously. The Endoscope                        was introduced through the mouth, and advanced to the                        second part of duodenum. The upper GI endoscopy was                        accomplished without difficulty. The patient tolerated                        the procedure well. Findings:      Localized moderate mucosal changes characterized by discoloration,       flattening, scarring and a decreased vascular pattern were found in the       distal esophagus. No sign of neoplastic tissue, some mucosa with an off       color reddish tint, proximal to the whitish scar tissue.      Localized moderate mucosal changes characterized by erythema, friability       (with contact bleeding) and granularity were found in the cardia.       Biopsies were taken with a cold forceps for histology.      In the area of  scar and the area of reddish tint no neoplastic tissue       seen.  The examined duodenum was normal.      The gastric body, gastric antrum and pylorus were normal. Impression:           - Discolored, flattened, scarred, decreased vascular                        pattern mucosa in the esophagus.                       - Erythematous, friable (with contact bleeding) and                        granular mucosa in the cardia. Biopsied.                       - Normal examined duodenum.                       - Normal gastric body, antrum and pylorus. Recommendation:       Follow up with oncologist. Repeat EGD in 4-6 months or                        as per their recommendation.                       - soft food for 3 days, eat slowly, chew well, take                        small bites Manya Silvas, MD 04/29/2016 11:35:41 AM This report has been signed electronically. Number of Addenda: 0 Note Initiated On: 04/29/2016 11:04 AM      [facility]

## 2016-04-29 NOTE — Transfer of Care (Signed)
Immediate Anesthesia Transfer of Care Note  Patient: Marcus [name]  Procedure(s) Performed: Procedure(s): ESOPHAGOGASTRODUODENOSCOPY (EGD) WITH PROPOFOL (N/A)  Patient Location: PACU and Endoscopy Unit  Anesthesia Type:General  Level of Consciousness: sedated  Airway & Oxygen Therapy: Patient Spontanous Breathing and Patient connected to nasal cannula oxygen  Post-op Assessment: Report given to RN and Post -op Vital signs reviewed and stable  Post vital signs: Reviewed and stable  Last Vitals:  Vitals:   04/29/16 1033  BP: (!) 152/54  Pulse: 64  Resp: 16  Temp: 36.4 C    Last Pain:  Vitals:   04/29/16 1033  TempSrc: Tympanic         Complications: No apparent anesthesia complications

## 2016-04-29 NOTE — H&P (Signed)
Primary Care Physician:  Chesley Noon, MD Primary Gastroenterologist:  Dr. Vira Agar  Pre-Procedure History & Physical: HPI:  Marcus [name] is a 82 y.o. male is here for an endoscopy.   Past Medical History:  Diagnosis Date  . Aphakia of right eye   . Arthritis   . Cancer ([place])    SKIN  . Enlarged prostate without lower urinary tract symptoms (luts)   . Esophageal cancer ([place])   . GERD (gastroesophageal reflux disease)   . Glaucoma   . Gout   . History of CVA (cerebrovascular accident)    Cerebral infarction Nov. 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 History of Doppler ultrasound    dopplers revealed mild left ICA stenosis which has remained stable, occluded left subclavian artery with retrograde left vertebral filling and moderately severe right subclavian artery stenosis. he has no symptoms of upper extermity claudication or subclavian steal symptoms.   . History of DVT (deep vein thrombosis)    right arm  . History of stress test 09/02/2009   abnormal myocardial perfusion scan demonstrating an attenuation defect in the inferior region of the myocardium. No ischemia or infarct/scar is seen in the remaining myocardium. No prior study available for comparison. Abnormal myocardial study EF 79%  . Hyperlipemia   . Hypertension   . Macular degeneration   . Stroke ([place])   . Subclavian artery stenosis [facility])     Past Surgical History:  Procedure Laterality Date  . CATARACT EXTRACTION    . COLONOSCOPY  2002  . ESOPHAGOGASTRODUODENOSCOPY (EGD) WITH PROPOFOL N/A 11/10/2015   Procedure: ESOPHAGOGASTRODUODENOSCOPY (EGD) WITH PROPOFOL;  Surgeon: Manya Silvas, MD;  Location: [facility] ENDOSCOPY;  Service: Endoscopy;  Laterality: N/A;  . FRACTURE SURGERY    . MOHS SURGERY    . PERIPHERAL VASCULAR CATHETERIZATION N/A 11/29/2015   Procedure: Glori Luis Cath Insertion;  Surgeon: Algernon Huxley, MD;  Location: [place] CV LAB;  Service: Cardiovascular;  Laterality: N/A;    Prior  to Admission medications   Medication Sig Start Date End Date Taking? Authorizing Provider  amLODipine (NORVASC) 2.5 MG tablet Take 2.5 mg by mouth daily. 02/11/16  Yes Historical Provider, MD  Coenzyme Q10 (COQ10) 100 MG CAPS Take 100 mg by mouth daily. Reported on 11/10/2015   Yes Historical Provider, MD  cyanocobalamin 500 MCG tablet Take 500 mcg by mouth daily.   Yes Historical Provider, MD  dorzolamide (TRUSOPT) 2 % ophthalmic solution Place 1 drop into both eyes 2 (two) times daily.   Yes Historical Provider, MD  finasteride (PROSCAR) 5 MG tablet Take 5 mg by mouth daily. 12/06/14  Yes Historical Provider, MD  latanoprost (XALATAN) 0.005 % ophthalmic solution Place 1 drop into both eyes at bedtime.   Yes Historical Provider, MD  magnesium oxide (MAG-OX) 400 MG tablet Take 400 mg by mouth daily.   Yes Historical Provider, MD  metoprolol succinate (TOPROL-XL) 25 MG 24 hr tablet TAKE ONE TABLET (25 MG TOTAL) BY MOUTH DAILY. 04/19/15  Yes Historical Provider, MD  timolol (TIMOPTIC) 0.5 % ophthalmic solution Place 1 drop into both eyes 2 (two) times daily. 10/27/14  Yes Historical Provider, MD  Vitamin D, Ergocalciferol, (DRISDOL) 50000 UNITS CAPS Take 50,000 Units by mouth every 7 (seven) days.   Yes Historical Provider, MD  apixaban (ELIQUIS) 5 MG TABS tablet Take 2 tablets (10 mg total) by mouth 2 (two) times daily. 02/26/16 03/02/16  Maryann Mikhail, DO  apixaban (ELIQUIS) 5 MG TABS tablet Take 1  tablet (5 mg total) by mouth 2 (two) times daily. Start on 03/03/2016 03/03/16   Maryann Mikhail, DO  atorvastatin (LIPITOR) 20 MG tablet Take 1 tablet (20 mg total) by mouth daily at 6 PM. Patient taking differently: Take 20 mg by mouth daily with breakfast.  12/26/14   Delfina Redwood, MD  clopidogrel (PLAVIX) 75 MG tablet Take 1 tablet (75 mg total) by mouth daily. 12/26/14   Delfina Redwood, MD  dexamethasone (DECADRON) 4 MG tablet Take 8 mg by mouth as directed. 8 mg the day after chemo for 2 days  following chemotherapy 12/04/15   Historical Provider, MD  lidocaine-prilocaine (EMLA) cream Apply 1 application topically as needed. Apply generously over the Mediport 45 minutes prior to chemotherapy. 11/24/15   Cammie Sickle, MD  ondansetron (ZOFRAN) 8 MG tablet Take 1 tablet (8 mg total) by mouth every 8 (eight) hours as needed for nausea or vomiting (start 3 days; after chemo). Patient not taking: Reported on 03/27/2016 11/24/15   Cammie Sickle, MD  prochlorperazine (COMPAZINE) 10 MG tablet Take 1 tablet (10 mg total) by mouth every 6 (six) hours as needed for nausea or vomiting. Patient not taking: Reported on 03/27/2016 11/24/15   Cammie Sickle, MD    Allergies as of 04/18/2016 - Review Complete 03/27/2016  Allergen Reaction Noted  . Codeine Other (See Comments) and Anaphylaxis 12/03/2012  . Allopurinol Other (See Comments) 12/24/2014  . Simvastatin Other (See Comments) 12/24/2014  . Uloric [febuxostat] Other (See Comments) 12/24/2014    Family History  Problem Relation Age of Onset  . Heart disease Brother   . Cancer - Colon Sister   . Cancer Sister   . Other Brother     CABG    Social History   Social History  . Marital status: Married    Spouse name: N/A  . Number of children: N/A  . Years of education: N/A   Occupational History  . Not on file.   Social History Main Topics  . Smoking status: Former Smoker    Packs/day: 1.00    Years: 40.00    Types: Cigarettes    Quit date: 06/03/1993  . Smokeless tobacco: Never Used  . Alcohol use No  . Drug use: No  . Sexual activity: Not on file   Other Topics Concern  . Not on file   Social History Narrative  . No narrative on file    Review of Systems: See HPI, otherwise negative ROS  Physical Exam: BP (!) 152/54   Pulse 64   Temp 97.6 F (36.4 C) (Tympanic)   Resp 16   Ht 6\' 2"  (1.88 m)   Wt 86.2 kg (190 lb)   SpO2 96%   BMI 24.39 kg/m  General:   Alert,  pleasant and cooperative in  NAD Head:  Normocephalic and atraumatic. Neck:  Supple; no masses or thyromegaly. Lungs:  Clear throughout to auscultation.    Heart:  Regular rate and rhythm. Abdomen:  Soft, nontender and nondistended. Normal bowel sounds, without guarding, and without rebound.   Neurologic:  Alert and  oriented x4;  grossly normal neurologically.  Impression/Plan: Red Christians is here for an endoscopy to be performed for follow up of esophageal cancer after chemotherapy and radiation therapy.  Risks, benefits, limitations, and alternatives regarding  endoscopy have been reviewed with the patient.  Questions have been answered.  All parties agreeable.   Gaylyn Cheers, MD  04/29/2016, 11:02 AM

## 2016-04-29 NOTE — Telephone Encounter (Signed)
  Oncology Nurse Navigator Documentation Went over upcoming appts with Dr Rogue Bussing for 12/11. No further scans needed at this time. Appts also mailed to home address as requested. Navigator Location: [facility] (04/29/16 1600)   )Navigator Encounter Type: Telephone (04/29/16 1600) Telephone: Lahoma Crocker Call;Appt Confirmation/Clarification (04/29/16 1600)                                                  Time Spent with Patient: 15 (04/29/16 1600)

## 2016-04-29 NOTE — Anesthesia Postprocedure Evaluation (Signed)
Anesthesia Post Note  Patient: Marcus [name]  Procedure(s) Performed: Procedure(s) (LRB): ESOPHAGOGASTRODUODENOSCOPY (EGD) WITH PROPOFOL (N/A)  Patient location during evaluation: Endoscopy Anesthesia Type: General Level of consciousness: awake and alert Pain management: pain level controlled Vital Signs Assessment: post-procedure vital signs reviewed and stable Respiratory status: spontaneous breathing and respiratory function stable Cardiovascular status: stable Anesthetic complications: no    Last Vitals:  Vitals:   04/29/16 1033 04/29/16 1134  BP: (!) 152/54 (!) 87/50  Pulse: 64 60  Resp: 16 18  Temp: 36.4 C 37 C    Last Pain:  Vitals:   04/29/16 1033  TempSrc: Tympanic                 KEPHART,WILLIAM K

## 2016-04-30 ENCOUNTER — Encounter: Payer: Self-pay | Admitting: Unknown Physician Specialty

## 2016-04-30 LAB — SURGICAL PATHOLOGY

## 2016-05-13 ENCOUNTER — Encounter: Payer: Self-pay | Admitting: Internal Medicine

## 2016-05-13 ENCOUNTER — Inpatient Hospital Stay: Payer: Medicare Other

## 2016-05-13 ENCOUNTER — Inpatient Hospital Stay: Payer: Medicare Other | Attending: Internal Medicine | Admitting: Internal Medicine

## 2016-05-13 VITALS — BP 150/63 | HR 69 | Temp 97.2°F | Ht 74.0 in | Wt 192.6 lb

## 2016-05-13 DIAGNOSIS — H409 Unspecified glaucoma: Secondary | ICD-10-CM | POA: Insufficient documentation

## 2016-05-13 DIAGNOSIS — Z9221 Personal history of antineoplastic chemotherapy: Secondary | ICD-10-CM | POA: Diagnosis not present

## 2016-05-13 DIAGNOSIS — Z8673 Personal history of transient ischemic attack (TIA), and cerebral infarction without residual deficits: Secondary | ICD-10-CM | POA: Insufficient documentation

## 2016-05-13 DIAGNOSIS — Z79899 Other long term (current) drug therapy: Secondary | ICD-10-CM | POA: Insufficient documentation

## 2016-05-13 DIAGNOSIS — Z923 Personal history of irradiation: Secondary | ICD-10-CM | POA: Insufficient documentation

## 2016-05-13 DIAGNOSIS — Z87891 Personal history of nicotine dependence: Secondary | ICD-10-CM | POA: Insufficient documentation

## 2016-05-13 DIAGNOSIS — Z86718 Personal history of other venous thrombosis and embolism: Secondary | ICD-10-CM | POA: Insufficient documentation

## 2016-05-13 DIAGNOSIS — C155 Malignant neoplasm of lower third of esophagus: Secondary | ICD-10-CM | POA: Diagnosis present

## 2016-05-13 DIAGNOSIS — I129 Hypertensive chronic kidney disease with stage 1 through stage 4 chronic kidney disease, or unspecified chronic kidney disease: Secondary | ICD-10-CM | POA: Diagnosis not present

## 2016-05-13 DIAGNOSIS — E785 Hyperlipidemia, unspecified: Secondary | ICD-10-CM | POA: Diagnosis not present

## 2016-05-13 DIAGNOSIS — Z7901 Long term (current) use of anticoagulants: Secondary | ICD-10-CM | POA: Insufficient documentation

## 2016-05-13 DIAGNOSIS — N400 Enlarged prostate without lower urinary tract symptoms: Secondary | ICD-10-CM | POA: Insufficient documentation

## 2016-05-13 DIAGNOSIS — K219 Gastro-esophageal reflux disease without esophagitis: Secondary | ICD-10-CM | POA: Insufficient documentation

## 2016-05-13 DIAGNOSIS — N189 Chronic kidney disease, unspecified: Secondary | ICD-10-CM | POA: Diagnosis not present

## 2016-05-13 DIAGNOSIS — H3530 Unspecified macular degeneration: Secondary | ICD-10-CM | POA: Insufficient documentation

## 2016-05-13 DIAGNOSIS — M109 Gout, unspecified: Secondary | ICD-10-CM | POA: Insufficient documentation

## 2016-05-13 LAB — CBC WITH DIFFERENTIAL/PLATELET
Basophils Absolute: 0 10*3/uL (ref 0–0.1)
Basophils Relative: 1 %
Eosinophils Absolute: 0.1 10*3/uL (ref 0–0.7)
Eosinophils Relative: 3 %
HCT: 31.2 % — ABNORMAL LOW (ref 40.0–52.0)
HEMOGLOBIN: 10.6 g/dL — AB (ref 13.0–18.0)
LYMPHS PCT: 16 %
Lymphs Abs: 0.7 10*3/uL — ABNORMAL LOW (ref 1.0–3.6)
MCH: 33.2 pg (ref 26.0–34.0)
MCHC: 33.8 g/dL (ref 32.0–36.0)
MCV: 98.1 fL (ref 80.0–100.0)
Monocytes Absolute: 0.4 10*3/uL (ref 0.2–1.0)
Monocytes Relative: 10 %
NEUTROS PCT: 70 %
Neutro Abs: 3.1 10*3/uL (ref 1.4–6.5)
Platelets: 147 10*3/uL — ABNORMAL LOW (ref 150–440)
RBC: 3.18 MIL/uL — AB (ref 4.40–5.90)
RDW: 14.5 % (ref 11.5–14.5)
WBC: 4.4 10*3/uL (ref 3.8–10.6)

## 2016-05-13 LAB — COMPREHENSIVE METABOLIC PANEL
ALK PHOS: 109 U/L (ref 38–126)
ALT: 8 U/L — ABNORMAL LOW (ref 17–63)
AST: 18 U/L (ref 15–41)
Albumin: 3.5 g/dL (ref 3.5–5.0)
Anion gap: 5 (ref 5–15)
BUN: 18 mg/dL (ref 6–20)
CALCIUM: 8.7 mg/dL — AB (ref 8.9–10.3)
CO2: 25 mmol/L (ref 22–32)
Chloride: 108 mmol/L (ref 101–111)
Creatinine, Ser: 1.26 mg/dL — ABNORMAL HIGH (ref 0.61–1.24)
GFR, EST AFRICAN AMERICAN: 60 mL/min — AB (ref >60)
GFR, EST NON AFRICAN AMERICAN: 51 mL/min — AB (ref >60)
Glucose, Bld: 118 mg/dL — ABNORMAL HIGH (ref 65–99)
Potassium: 4.5 mmol/L (ref 3.5–5.1)
Sodium: 138 mmol/L (ref 135–145)
Total Bilirubin: 0.4 mg/dL (ref 0.3–1.2)
Total Protein: 7 g/dL (ref 6.5–8.1)

## 2016-05-13 NOTE — Progress Notes (Signed)
[place] OFFICE PROGRESS NOTE  Patient Care Team: Chesley Noon, MD as PCP - General (Family Medicine)  No matching staging information was found for the patient.   Oncology History   # June 2017- Localized [? Stage II; no EUS]Distal Esophagus Adeno CA [Bx Dr.Elliot]; PET- no distant Mets; July 5th 2017-concurrent Carbo-Taxol- RT [place] ]; OCT 18th PET- improved; DEC 2017- EGD- CR.   # LLL nodule- monitor for now [PET June 2017]   # CKD [creat 1.4-1.7]; Hx of stroke [facility] 2016]  June 2017-MOLECULAR STUDIES- Her 2- IHC-NEG; MMR-12/18/2015- ordered.      Esophageal cancer ([place])   11/10/2015 Initial Diagnosis    Esophageal cancer (H[facility])- distal esophagus- non-circumferential partially obstructing ulcerating mass- biopsy positive for mod diff  adenocarcinoma [Dr.Elliot]       Malignant neoplasm of lower third of esophagus ([place])   12/18/2015 Initial Diagnosis    Malignant neoplasm of lower third of esophagus ([place])         INTERVAL HISTORY:  Marcus [name] 82 y.o.  male pleasant patient above history of Locally advanced esophageal cancer- currently s/p definitive chemoradiation is here for follow-up; patient's last radiation was in 01/16/2016; Patient's last PET scan October 18 improved. Patient also had EGD interim with Dr. Vira Agar.  In the interim Patient was diagnosed with DVT of the right lower extremity.; He is on Xarelto. Complains of easy bruising. Otherwise denies any falls. Denies any difficulty swallowing or pain with swallowing.  Patient complains of swelling in the legs; left leg also. He has not been using compression stockings. No nausea no vomiting. No difficulty swallowing. Chronic mild swelling in the legs. No tingling or numbness.    His concern about sister being diagnosed with a tumor in the brain in [place].  REVIEW OF SYSTEMS:  A complete 10 point review of system is done which is negative except mentioned above/history of present  illness.   PAST MEDICAL HISTORY :  Past Medical History:  Diagnosis Date  . Aphakia of right eye   . Arthritis   . Cancer ([place])    SKIN  . Enlarged prostate without lower urinary tract symptoms (luts)   . Esophageal cancer ([place])   . GERD (gastroesophageal reflux disease)   . Glaucoma   . Gout   . History of CVA (cerebrovascular accident)    Cerebral infarction Nov. 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 History of Doppler ultrasound    dopplers revealed mild left ICA stenosis which has remained stable, occluded left subclavian artery with retrograde left vertebral filling and moderately severe right subclavian artery stenosis. he has no symptoms of upper extermity claudication or subclavian steal symptoms.   . History of DVT (deep vein thrombosis)    right arm  . History of stress test 09/02/2009   abnormal myocardial perfusion scan demonstrating an attenuation defect in the inferior region of the myocardium. No ischemia or infarct/scar is seen in the remaining myocardium. No prior study available for comparison. Abnormal myocardial study EF 79%  . Hyperlipemia   . Hypertension   . Macular degeneration   . Stroke ([place])   . Subclavian artery stenosis ([facility])     PAST SURGICAL HISTORY :   Past Surgical History:  Procedure Laterality Date  . CATARACT EXTRACTION    . COLONOSCOPY  2002  . ESOPHAGOGASTRODUODENOSCOPY (EGD) WITH PROPOFOL N/A 11/10/2015   Procedure: ESOPHAGOGASTRODUODENOSCOPY (EGD) WITH PROPOFOL;  Surgeon: Manya Silvas, MD;  Location: [place];  Service: Endoscopy;  Laterality: N/A;  . ESOPHAGOGASTRODUODENOSCOPY (EGD) WITH PROPOFOL N/A 04/29/2016   Procedure: ESOPHAGOGASTRODUODENOSCOPY (EGD) WITH PROPOFOL;  Surgeon: Manya Silvas, MD;  Location: [facility] ENDOSCOPY;  Service: Endoscopy;  Laterality: N/A;  . FRACTURE SURGERY    . MOHS SURGERY    . PERIPHERAL VASCULAR CATHETERIZATION N/A 11/29/2015   Procedure: Glori Luis Cath Insertion;  Surgeon: Algernon Huxley,  MD;  Location: [place] [facility] LAB;  Service: Cardiovascular;  Laterality: N/A;    FAMILY HISTORY :   Family History  Problem Relation Age of Onset  . Heart disease Brother   . Cancer - Colon Sister   . Cancer Sister   . Other Brother     CABG    SOCIAL HISTORY:   Social History  Substance Use Topics  . Smoking status: Former Smoker    Packs/day: 1.00    Years: 40.00    Types: Cigarettes    Quit date: 06/03/1993  . Smokeless tobacco: Never Used  . Alcohol use No    ALLERGIES:  is allergic to codeine; allopurinol; simvastatin; and uloric [febuxostat].  MEDICATIONS:  Current Outpatient Prescriptions  Medication Sig Dispense Refill  . amLODipine (NORVASC) 2.5 MG tablet Take 2.5 mg by mouth daily.  0  . apixaban (ELIQUIS) 5 MG TABS tablet Take 1 tablet (5 mg total) by mouth 2 (two) times daily. Start on 03/03/2016 60 tablet 0  . atorvastatin (LIPITOR) 20 MG tablet Take 1 tablet (20 mg total) by mouth daily at 6 PM. (Patient taking differently: Take 20 mg by mouth daily with breakfast. ) 30 tablet 1  . clopidogrel (PLAVIX) 75 MG tablet Take 1 tablet (75 mg total) by mouth daily. 30 tablet 1  . Coenzyme Q10 (COQ10) 100 MG CAPS Take 100 mg by mouth daily. Reported on 11/10/2015    . cyanocobalamin 500 MCG tablet Take 500 mcg by mouth daily.    . dorzolamide (TRUSOPT) 2 % ophthalmic solution Place 1 drop into both eyes 2 (two) times daily.    . finasteride (PROSCAR) 5 MG tablet Take 5 mg by mouth daily.  10  . latanoprost (XALATAN) 0.005 % ophthalmic solution Place 1 drop into both eyes at bedtime.    . lidocaine-prilocaine (EMLA) cream Apply 1 application topically as needed. Apply generously over the Mediport 45 minutes prior to chemotherapy. 30 g 0  . magnesium oxide (MAG-OX) 400 MG tablet Take 400 mg by mouth daily.    . metoprolol succinate (TOPROL-XL) 25 MG 24 hr tablet TAKE ONE TABLET (25 MG TOTAL) BY MOUTH DAILY.    Marland Kitchen terazosin (HYTRIN) 10 MG capsule Take 10 mg by mouth at  bedtime.  0  . timolol (TIMOPTIC) 0.5 % ophthalmic solution Place 1 drop into both eyes 2 (two) times daily.  3  . Vitamin D, Ergocalciferol, (DRISDOL) 50000 UNITS CAPS Take 50,000 Units by mouth every 7 (seven) days.    Marland Kitchen dexamethasone (DECADRON) 4 MG tablet Take 8 mg by mouth as directed. 8 mg the day after chemo for 2 days following chemotherapy  0  . ondansetron (ZOFRAN) 8 MG tablet Take 1 tablet (8 mg total) by mouth every 8 (eight) hours as needed for nausea or vomiting (start 3 days; after chemo). (Patient not taking: Reported on 05/13/2016) 40 tablet 1  . prochlorperazine (COMPAZINE) 10 MG tablet Take 1 tablet (10 mg total) by mouth every 6 (six) hours as needed for nausea or vomiting. (Patient not taking: Reported on 05/13/2016) 40 tablet 1   No  current facility-administered medications for this visit.     PHYSICAL EXAMINATION: ECOG PERFORMANCE STATUS: 2 - Symptomatic, <50% confined to bed  BP (!) 150/63 (BP Location: Left Arm, Patient Position: Sitting)   Pulse 69   Temp 97.2 F (36.2 C) (Oral)   Ht 6' 2"  (1.88 m)   Wt 192 lb 9.6 oz (87.4 kg)   SpO2 94%   BMI 24.73 kg/m   Filed Weights   05/13/16 1118  Weight: 192 lb 9.6 oz (87.4 kg)    GENERAL: Well-nourished well-developed; Alert, no distress and comfortable.  Accompanied by his wife. He walks with a cane. EYES: no pallor or icterus OROPHARYNX: no thrush or ulceration;   NECK: supple, no masses felt LYMPH:  no palpable lymphadenopathy in the cervical, axillary or inguinal regions LUNGS: clear to auscultation and  No wheeze or crackles HEART/CVS: regular rate & rhythm and no murmurs; 2 plus bil lower extremity edema ABDOMEN:abdomen soft, non-tender and normal bowel sounds Musculoskeletal:no cyanosis of digits and no clubbing  PSYCH: alert & oriented x 3 with fluent speech NEURO: no focal motor/sensory deficits SKIN:  no rashes;  LABORATORY DATA:  I have reviewed the data as listed    Component Value Date/Time    NA 138 05/13/2016 1050   K 4.5 05/13/2016 1050   CL 108 05/13/2016 1050   CO2 25 05/13/2016 1050   GLUCOSE 118 (H) 05/13/2016 1050   BUN 18 05/13/2016 1050   CREATININE 1.26 (H) 05/13/2016 1050   CALCIUM 8.7 (L) 05/13/2016 1050   PROT 7.0 05/13/2016 1050   ALBUMIN 3.5 05/13/2016 1050   AST 18 05/13/2016 1050   ALT 8 (L) 05/13/2016 1050   ALKPHOS 109 05/13/2016 1050   BILITOT 0.4 05/13/2016 1050   GFRNONAA 51 (L) 05/13/2016 1050   GFRAA 60 (L) 05/13/2016 1050    No results found for: SPEP, UPEP  Lab Results  Component Value Date   WBC 4.4 05/13/2016   NEUTROABS 3.1 05/13/2016   HGB 10.6 (L) 05/13/2016   HCT 31.2 (L) 05/13/2016   MCV 98.1 05/13/2016   PLT 147 (L) 05/13/2016      Chemistry      Component Value Date/Time   NA 138 05/13/2016 1050   K 4.5 05/13/2016 1050   CL 108 05/13/2016 1050   CO2 25 05/13/2016 1050   BUN 18 05/13/2016 1050   CREATININE 1.26 (H) 05/13/2016 1050      Component Value Date/Time   CALCIUM 8.7 (L) 05/13/2016 1050   ALKPHOS 109 05/13/2016 1050   AST 18 05/13/2016 1050   ALT 8 (L) 05/13/2016 1050   BILITOT 0.4 05/13/2016 1050       RADIOGRAPHIC STUDIES: I have personally reviewed the radiological images as listed and agreed with the findings in the report. No results found.   ASSESSMENT & PLAN:  Malignant neoplasm of lower third of esophagus (HCC) Localized esophageal cancer; no evidence of any local adenopathy. Patient s/p chemotherapy -RT [RT finished Aug 15th per pt]. OCt 18th PET- Improved; DEC 2017- EGD- Complete response/ path- NEGATIVE.   #  Plan repeat PET in mid April 2018; will decide on EGD [planned 6 months] based on PET.   # RLE [below knee DVT] on Eliquis. LLE- swelling- dependant edema; recommend leg elevation; if not improved then LE [place].   # follow up MD/labs; few days after PET scan; and then decide on the EGD.    Orders Placed This Encounter  Procedures  . NM PET Image Restag (  PS) Skull Base To Thigh     Standing Status:   Future    Standing Expiration Date:   07/13/2017    Order Specific Question:   Reason for Exam (SYMPTOM  OR DIAGNOSIS REQUIRED)    Answer:   esophageal cancer    Order Specific Question:   Preferred imaging location?    Answer:    [facility]    Standing Status:   Future    Standing Expiration Date:   05/13/2017  . CBC with Differential/Platelet    Standing Status:   Future    Standing Expiration Date:   05/13/2017  . Comprehensive metabolic panel    Standing Status:   Future    Standing Expiration Date:   05/13/2017   All questions were answered. The patient knows to call the clinic with any problems, questions or concerns.      Cammie Sickle, MD 05/13/2016 12:24 PM

## 2016-05-13 NOTE — Progress Notes (Signed)
Patient here for follow up. Since last visit patient has been hospitalized for blood clots in bilateral lungs and right leg. He has swelling in both lower extremities. + 2 pitting edema the swelling started about two months ago.

## 2016-05-13 NOTE — Assessment & Plan Note (Addendum)
Localized esophageal cancer; no evidence of any local adenopathy. Patient s/p chemotherapy -RT [RT finished Aug 15th per pt]. OCt 18th PET- Improved; DEC 2017- EGD- Complete response/ path- NEGATIVE.   #  Plan repeat PET in mid April 2018; will decide on EGD [planned 6 months] based on PET.   # RLE [below knee DVT] on Eliquis. LLE- swelling- dependant edema; recommend leg elevation; if not improved then LE [place].   # follow up MD/labs; few days after PET scan; and then decide on the EGD.

## 2016-06-07 ENCOUNTER — Inpatient Hospital Stay: Payer: Medicare Other | Attending: Internal Medicine

## 2016-06-07 DIAGNOSIS — C155 Malignant neoplasm of lower third of esophagus: Secondary | ICD-10-CM | POA: Insufficient documentation

## 2016-06-07 DIAGNOSIS — Z923 Personal history of irradiation: Secondary | ICD-10-CM | POA: Insufficient documentation

## 2016-06-07 DIAGNOSIS — Z86718 Personal history of other venous thrombosis and embolism: Secondary | ICD-10-CM | POA: Insufficient documentation

## 2016-06-07 DIAGNOSIS — Z9221 Personal history of antineoplastic chemotherapy: Secondary | ICD-10-CM | POA: Insufficient documentation

## 2016-06-07 DIAGNOSIS — Z7901 Long term (current) use of anticoagulants: Secondary | ICD-10-CM | POA: Insufficient documentation

## 2016-06-07 DIAGNOSIS — Z452 Encounter for adjustment and management of vascular access device: Secondary | ICD-10-CM | POA: Insufficient documentation

## 2016-06-13 ENCOUNTER — Inpatient Hospital Stay: Payer: Medicare Other

## 2016-06-13 DIAGNOSIS — Z452 Encounter for adjustment and management of vascular access device: Secondary | ICD-10-CM | POA: Diagnosis not present

## 2016-06-13 DIAGNOSIS — C155 Malignant neoplasm of lower third of esophagus: Secondary | ICD-10-CM | POA: Diagnosis not present

## 2016-06-13 DIAGNOSIS — Z86718 Personal history of other venous thrombosis and embolism: Secondary | ICD-10-CM | POA: Diagnosis not present

## 2016-06-13 DIAGNOSIS — Z923 Personal history of irradiation: Secondary | ICD-10-CM | POA: Diagnosis not present

## 2016-06-13 DIAGNOSIS — Z95828 Presence of other vascular implants and grafts: Secondary | ICD-10-CM

## 2016-06-13 DIAGNOSIS — Z7901 Long term (current) use of anticoagulants: Secondary | ICD-10-CM | POA: Diagnosis not present

## 2016-06-13 DIAGNOSIS — Z9221 Personal history of antineoplastic chemotherapy: Secondary | ICD-10-CM | POA: Diagnosis not present

## 2016-06-13 MED ORDER — HEPARIN SOD (PORK) LOCK FLUSH 100 UNIT/ML IV SOLN
500.0000 [IU] | Freq: Once | INTRAVENOUS | Status: AC
  Administered 2016-06-13: 500 [IU] via INTRAVENOUS

## 2016-06-13 MED ORDER — SODIUM CHLORIDE 0.9% FLUSH
10.0000 mL | INTRAVENOUS | Status: DC | PRN
  Administered 2016-06-13: 10 mL via INTRAVENOUS
  Filled 2016-06-13: qty 10

## 2016-06-25 ENCOUNTER — Other Ambulatory Visit: Payer: Medicare Other

## 2016-06-25 ENCOUNTER — Ambulatory Visit: Payer: Medicare Other

## 2016-06-27 ENCOUNTER — Ambulatory Visit: Payer: Medicare Other | Admitting: Internal Medicine

## 2016-06-27 ENCOUNTER — Other Ambulatory Visit: Payer: Medicare Other

## 2016-07-19 ENCOUNTER — Inpatient Hospital Stay: Payer: Medicare Other

## 2016-07-26 ENCOUNTER — Inpatient Hospital Stay: Payer: Medicare Other | Attending: Internal Medicine

## 2016-07-26 DIAGNOSIS — Z452 Encounter for adjustment and management of vascular access device: Secondary | ICD-10-CM | POA: Diagnosis not present

## 2016-07-26 DIAGNOSIS — Z95828 Presence of other vascular implants and grafts: Secondary | ICD-10-CM

## 2016-07-26 DIAGNOSIS — C155 Malignant neoplasm of lower third of esophagus: Secondary | ICD-10-CM | POA: Diagnosis present

## 2016-07-26 MED ORDER — SODIUM CHLORIDE 0.9% FLUSH
10.0000 mL | INTRAVENOUS | Status: DC | PRN
  Administered 2016-07-26: 10 mL via INTRAVENOUS
  Filled 2016-07-26: qty 10

## 2016-07-26 MED ORDER — HEPARIN SOD (PORK) LOCK FLUSH 100 UNIT/ML IV SOLN
500.0000 [IU] | Freq: Once | INTRAVENOUS | Status: AC
  Administered 2016-07-26: 500 [IU] via INTRAVENOUS

## 2016-07-29 ENCOUNTER — Encounter: Payer: Self-pay | Admitting: Radiation Oncology

## 2016-07-29 ENCOUNTER — Ambulatory Visit
Admission: RE | Admit: 2016-07-29 | Discharge: 2016-07-29 | Disposition: A | Discharge status: Home / Self Care | Payer: Medicare Other | Source: Ambulatory Visit | Attending: Radiation Oncology | Admitting: Radiation Oncology

## 2016-07-29 VITALS — BP 127/60 | HR 62 | Temp 97.8°F | Wt 200.5 lb

## 2016-07-29 DIAGNOSIS — Z8501 Personal history of malignant neoplasm of esophagus: Secondary | ICD-10-CM | POA: Insufficient documentation

## 2016-07-29 DIAGNOSIS — C155 Malignant neoplasm of lower third of esophagus: Secondary | ICD-10-CM

## 2016-07-29 DIAGNOSIS — Z87891 Personal history of nicotine dependence: Secondary | ICD-10-CM | POA: Diagnosis not present

## 2016-07-29 DIAGNOSIS — Z9221 Personal history of antineoplastic chemotherapy: Secondary | ICD-10-CM | POA: Insufficient documentation

## 2016-07-29 DIAGNOSIS — Z923 Personal history of irradiation: Secondary | ICD-10-CM | POA: Insufficient documentation

## 2016-07-29 NOTE — Progress Notes (Signed)
Radiation Oncology Follow up Note  Name: Marcus [name]   Date:   07/29/2016 MRN:  [id number] DOB: 1932-03-10    This 83 y.o. male presents to the clinic today for 5 month follow-up status post chemoradiation for stage II esophageal carcinoma.  REFERRING PROVIDER: Chesley Noon, MD  HPI: Patient is an 83 year old male now out 5 months having completed combined modality treatment for stage II esophageal carcinoma of the distal third of the esophagus and adenocarcinoma. Seen today in routine follow-up he is doing well. He's had an excellent response. By PET CT criteria. Also had upper endoscopy by Dr. Tiffany Kocher showed no evidence of disease. He is doing well he is having no dysphagia. His weight is stable. He has a follow-up PET CT scan ordered in April.  COMPLICATIONS OF TREATMENT: none  FOLLOW UP COMPLIANCE: keeps appointments   PHYSICAL EXAM:  BP 127/60   Pulse 62   Temp 97.8 F (36.6 C)   Wt 200 lb 8.1 oz (90.9 kg)   BMI 25.74 kg/m  Well-developed well-nourished patient in NAD. HEENT reveals PERLA, EOMI, discs not visualized.  Oral cavity is clear. No oral mucosal lesions are identified. Neck is clear without evidence of cervical or supraclavicular adenopathy. Lungs are clear to A&P. Cardiac examination is essentially unremarkable with regular rate and rhythm without murmur rub or thrill. Abdomen is benign with no organomegaly or masses noted. Motor sensory and DTR levels are equal and symmetric in the upper and lower extremities. Cranial nerves II through XII are grossly intact. Proprioception is intact. No peripheral adenopathy or edema is identified. No motor or sensory levels are noted. Crude visual fields are within normal range.  RADIOLOGY RESULTS: PET CT scan and endoscopy report of both reviewed  PLAN: Present time patient is done extremely well with no evidence of disease by upper endoscopy. I am please was overall progress. I've asked to see him back in 6 months for  follow-up. I will review his PET/CT scan in April. Patient knows to call with any concerns.  I would like to take this opportunity to thank you for allowing me to participate in the care of your patient.Armstead Peaks., MD

## 2016-07-31 ENCOUNTER — Ambulatory Visit: Payer: Medicare Other | Admitting: Radiation Oncology

## 2016-08-30 ENCOUNTER — Inpatient Hospital Stay: Payer: Medicare Other

## 2016-09-04 ENCOUNTER — Encounter
Admission: RE | Admit: 2016-09-04 | Discharge: 2016-09-04 | Disposition: A | Discharge status: Home / Self Care | Payer: Medicare Other | Source: Ambulatory Visit | Attending: Internal Medicine | Admitting: Internal Medicine

## 2016-09-04 DIAGNOSIS — C155 Malignant neoplasm of lower third of esophagus: Secondary | ICD-10-CM

## 2016-09-04 LAB — GLUCOSE, CAPILLARY: Glucose-Capillary: 102 mg/dL — ABNORMAL HIGH (ref 65–99)

## 2016-09-04 MED ORDER — FLUDEOXYGLUCOSE F - 18 (FDG) INJECTION
13.3700 | Freq: Once | INTRAVENOUS | Status: AC | PRN
  Administered 2016-09-04: 13.37 via INTRAVENOUS

## 2016-09-06 ENCOUNTER — Inpatient Hospital Stay: Payer: Medicare Other

## 2016-09-06 ENCOUNTER — Inpatient Hospital Stay: Payer: Medicare Other | Attending: Internal Medicine | Admitting: Internal Medicine

## 2016-09-06 VITALS — BP 167/76 | HR 50 | Temp 96.4°F | Resp 18 | Wt 197.2 lb

## 2016-09-06 DIAGNOSIS — I129 Hypertensive chronic kidney disease with stage 1 through stage 4 chronic kidney disease, or unspecified chronic kidney disease: Secondary | ICD-10-CM | POA: Insufficient documentation

## 2016-09-06 DIAGNOSIS — R911 Solitary pulmonary nodule: Secondary | ICD-10-CM | POA: Diagnosis not present

## 2016-09-06 DIAGNOSIS — H409 Unspecified glaucoma: Secondary | ICD-10-CM | POA: Diagnosis not present

## 2016-09-06 DIAGNOSIS — M109 Gout, unspecified: Secondary | ICD-10-CM | POA: Diagnosis not present

## 2016-09-06 DIAGNOSIS — Z8673 Personal history of transient ischemic attack (TIA), and cerebral infarction without residual deficits: Secondary | ICD-10-CM | POA: Insufficient documentation

## 2016-09-06 DIAGNOSIS — C155 Malignant neoplasm of lower third of esophagus: Secondary | ICD-10-CM | POA: Insufficient documentation

## 2016-09-06 DIAGNOSIS — H3530 Unspecified macular degeneration: Secondary | ICD-10-CM | POA: Diagnosis not present

## 2016-09-06 DIAGNOSIS — N183 Chronic kidney disease, stage 3 (moderate): Secondary | ICD-10-CM | POA: Diagnosis not present

## 2016-09-06 DIAGNOSIS — Z79899 Other long term (current) drug therapy: Secondary | ICD-10-CM | POA: Insufficient documentation

## 2016-09-06 DIAGNOSIS — K5730 Diverticulosis of large intestine without perforation or abscess without bleeding: Secondary | ICD-10-CM | POA: Insufficient documentation

## 2016-09-06 DIAGNOSIS — Z923 Personal history of irradiation: Secondary | ICD-10-CM | POA: Diagnosis not present

## 2016-09-06 DIAGNOSIS — Z87891 Personal history of nicotine dependence: Secondary | ICD-10-CM | POA: Diagnosis not present

## 2016-09-06 DIAGNOSIS — N400 Enlarged prostate without lower urinary tract symptoms: Secondary | ICD-10-CM | POA: Diagnosis not present

## 2016-09-06 DIAGNOSIS — Z86718 Personal history of other venous thrombosis and embolism: Secondary | ICD-10-CM | POA: Diagnosis not present

## 2016-09-06 DIAGNOSIS — K219 Gastro-esophageal reflux disease without esophagitis: Secondary | ICD-10-CM | POA: Insufficient documentation

## 2016-09-06 DIAGNOSIS — E785 Hyperlipidemia, unspecified: Secondary | ICD-10-CM | POA: Insufficient documentation

## 2016-09-06 DIAGNOSIS — Z95828 Presence of other vascular implants and grafts: Secondary | ICD-10-CM

## 2016-09-06 DIAGNOSIS — Z9221 Personal history of antineoplastic chemotherapy: Secondary | ICD-10-CM | POA: Diagnosis not present

## 2016-09-06 LAB — CBC WITH DIFFERENTIAL/PLATELET
Basophils Absolute: 0 10*3/uL (ref 0–0.1)
Basophils Relative: 1 %
EOS ABS: 0.1 10*3/uL (ref 0–0.7)
EOS PCT: 2 %
HCT: 28.6 % — ABNORMAL LOW (ref 40.0–52.0)
HEMOGLOBIN: 9.9 g/dL — AB (ref 13.0–18.0)
LYMPHS ABS: 0.6 10*3/uL — AB (ref 1.0–3.6)
LYMPHS PCT: 15 %
MCH: 33 pg (ref 26.0–34.0)
MCHC: 34.5 g/dL (ref 32.0–36.0)
MCV: 95.8 fL (ref 80.0–100.0)
MONOS PCT: 10 %
Monocytes Absolute: 0.4 10*3/uL (ref 0.2–1.0)
NEUTROS PCT: 72 %
Neutro Abs: 2.9 10*3/uL (ref 1.4–6.5)
Platelets: 164 10*3/uL (ref 150–440)
RBC: 2.99 MIL/uL — ABNORMAL LOW (ref 4.40–5.90)
RDW: 15.4 % — ABNORMAL HIGH (ref 11.5–14.5)
WBC: 4 10*3/uL (ref 3.8–10.6)

## 2016-09-06 LAB — COMPREHENSIVE METABOLIC PANEL
ALK PHOS: 111 U/L (ref 38–126)
ALT: 8 U/L — ABNORMAL LOW (ref 17–63)
ANION GAP: 6 (ref 5–15)
AST: 17 U/L (ref 15–41)
Albumin: 3.4 g/dL — ABNORMAL LOW (ref 3.5–5.0)
BUN: 29 mg/dL — ABNORMAL HIGH (ref 6–20)
CO2: 20 mmol/L — AB (ref 22–32)
CREATININE: 1.54 mg/dL — AB (ref 0.61–1.24)
Calcium: 8.6 mg/dL — ABNORMAL LOW (ref 8.9–10.3)
Chloride: 111 mmol/L (ref 101–111)
GFR, EST AFRICAN AMERICAN: 46 mL/min — AB (ref >60)
GFR, EST NON AFRICAN AMERICAN: 40 mL/min — AB (ref >60)
Glucose, Bld: 117 mg/dL — ABNORMAL HIGH (ref 65–99)
Potassium: 4.3 mmol/L (ref 3.5–5.1)
SODIUM: 137 mmol/L (ref 135–145)
TOTAL PROTEIN: 7.2 g/dL (ref 6.5–8.1)
Total Bilirubin: 0.3 mg/dL (ref 0.3–1.2)

## 2016-09-06 LAB — FERRITIN: FERRITIN: 54 ng/mL (ref 24–336)

## 2016-09-06 LAB — IRON AND TIBC
IRON: 47 ug/dL (ref 45–182)
Saturation Ratios: 15 % — ABNORMAL LOW (ref 17.9–39.5)
TIBC: 316 ug/dL (ref 250–450)
UIBC: 269 ug/dL

## 2016-09-06 MED ORDER — HEPARIN SOD (PORK) LOCK FLUSH 100 UNIT/ML IV SOLN
500.0000 [IU] | Freq: Once | INTRAVENOUS | Status: AC
  Administered 2016-09-06: 500 [IU] via INTRAVENOUS

## 2016-09-06 MED ORDER — SODIUM CHLORIDE 0.9% FLUSH
10.0000 mL | INTRAVENOUS | Status: DC | PRN
  Administered 2016-09-06: 10 mL via INTRAVENOUS
  Filled 2016-09-06: qty 10

## 2016-09-06 NOTE — Progress Notes (Signed)
Patient states he has had some center chest pain when he takes a deep breath.  Tylenol takes care of it.  Since last visit, he was hospitalized for PE. Currently on 5 mg Eliquis daily.

## 2016-09-06 NOTE — Assessment & Plan Note (Addendum)
Localized esophageal cancer- Patient s/p chemotherapy -RT [RT finished Aug 15th 2018 ]. Patient had a complete response posttreatment based on EGD/pathology imaging in December 2017. However unfortunately today PET scan shows- local recurrence in the lower esophagus; and also a right axillary lymph node [ unusual for lymphatic drainage].   # Recommend biopsy of the right axillary lymph node [most easily accessible]; and if negative recommend EGD with biopsy. Discussed that the patient would need systemic therapy- 5-FU based after confirmation. We will check PDL 1 status.  # LLL nodule ~ 2cm/ low SUV; slow growing since 2007; stable from 2017. Monitor for now.   # Anemia hemoglobin 9.5. Recommend checking iron studies.  # CKD stage III creatinine 1.5. Stable.  # Sep 2017 RLE [below knee DVT] on Eliquis x 6 months ; will discontinue Eliquis at the next visit.  /Hx of stroke- recommend holding Plavix and week prior to biopsy and also Eliquis 4 days prior to biopsy.I spoke to Dr.Green in radiology.   # Talked to terri/daughter; regarding the above plan. She agrees. Patient's wife interested in a second opinion; recommend [facility]. Patient will follow-up with me 4-5 days post biopsy.  # I reviewed the blood work- with the patient in detail; also reviewed the imaging independently [as summarized above]; and with the patient in detail.   # # 40 minutes face-to-face with the patient discussing the above plan of care; more than 50% of time spent on prognosis/ natural history; counseling and coordination.

## 2016-09-06 NOTE — Progress Notes (Signed)
Marc[name] OFFICE PROGRESS NOTE  Patient Care Team: Chesley Noon, MD as PCP - General (Family Medicine)  Cancer Staging No matching staging information was found for the patient.   Oncology History   # June 2017- Localized [? Stage II; no EUS]Distal Esophagus Adeno CA [Bx Dr.Elliot]; PET- no distant Mets; July 5th 2017-concurrent Carbo-Taxol- RT Vespasian.Sprague ]; OCT 18th PET- improved; DEC 2017- EGD- CR.   # LLL nodule- monitor for now [PET June 2017]  # CKD [creat 1.4-1.7]; Hx of stroke [facility] 2016]  June 2017-MOLECULAR STUDIES- Her 2- IHC-NEG; MMR-STABLE; 09/2016- PDL-1 ordered.      Malignant neoplasm of lower third of esophagus (HCC)      INTERVAL HISTORY:  Marcus [name] 83 y.o.  male pleasant patient above history of Locally advanced esophageal cancer- currently s/p definitive chemoradiation is here for follow-up; patient's last radiation was in 01/16/2016; Patient is seen to review the results of his restaging PET scan.  Patient denies any difficult swallowing. Denies any Pain with swallowing. No nausea vomiting. Appetite good. No weight loss. He denies any new lumps or bumps. Denies any blood in stools or black stools.   Patient complains of swelling in the legs; left leg also. He has not been using compression stockings. No nausea no vomiting. No tingling or numbness.     REVIEW OF SYSTEMS:  A complete 10 point review of system is done which is negative except mentioned above/history of present illness.   PAST MEDICAL HISTORY :  Past Medical History:  Diagnosis Date  . Aphakia of right eye   . Arthritis   . Cancer ([place])    SKIN  . Enlarged prostate without lower urinary tract symptoms (luts)   . Esophageal cancer ([place])   . GERD (gastroesophageal reflux disease)   . Glaucoma   . Gout   . History of CVA (cerebrovascular accident)    Cerebral infarction Nov. 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 History of Doppler ultrasound     dopplers revealed mild left ICA stenosis which has remained stable, occluded left subclavian artery with retrograde left vertebral filling and moderately severe right subclavian artery stenosis. he has no symptoms of upper extermity claudication or subclavian steal symptoms.   . History of DVT (deep vein thrombosis)    right arm  . History of stress test 09/02/2009   abnormal myocardial perfusion scan demonstrating an attenuation defect in the inferior region of the myocardium. No ischemia or infarct/scar is seen in the remaining myocardium. No prior study available for comparison. Abnormal myocardial study EF 79%  . Hyperlipemia   . Hypertension   . Macular degeneration   . Stroke ([place])   . Subclavian artery stenosis (HCC)     PAST SURGICAL HISTORY :   Past Surgical History:  Procedure Laterality Date  . CATARACT EXTRACTION    . COLONOSCOPY  2002  . ESOPHAGOGASTRODUODENOSCOPY (EGD) WITH PROPOFOL N/A 11/10/2015   Procedure: ESOPHAGOGASTRODUODENOSCOPY (EGD) WITH PROPOFOL;  Surgeon: Manya Silvas, MD;  Location: [facility] ENDOSCOPY;  Service: Endoscopy;  Laterality: N/A;  . ESOPHAGOGASTRODUODENOSCOPY (EGD) WITH PROPOFOL N/A 04/29/2016   Procedure: ESOPHAGOGASTRODUODENOSCOPY (EGD) WITH PROPOFOL;  Surgeon: Manya Silvas, MD;  Location: [facility] ENDOSCOPY;  Service: Endoscopy;  Laterality: N/A;  . FRACTURE SURGERY    . MOHS SURGERY    . PERIPHERAL VASCULAR CATHETERIZATION N/A 11/29/2015   Procedure: Glori Luis Cath Insertion;  Surgeon: Algernon Huxley, MD;  Location: [place] CV LAB;  Service: Cardiovascular;  Laterality: N/A;    FAMILY HISTORY :   Family History  Problem Relation Age of Onset  . Heart disease Brother   . Cancer - Colon Sister   . Cancer Sister   . Other Brother     CABG    SOCIAL HISTORY:   Social History  Substance Use Topics  . Smoking status: Former Smoker    Packs/day: 1.00    Years: 40.00    Types: Cigarettes    Quit date: 06/03/1993  . Smokeless tobacco: Never Used   . Alcohol use No    ALLERGIES:  is allergic to codeine; allopurinol; simvastatin; and uloric [febuxostat].  MEDICATIONS:  Current Outpatient Prescriptions  Medication Sig Dispense Refill  . amLODipine (NORVASC) 2.5 MG tablet Take 2.5 mg by mouth daily.  0  . apixaban (ELIQUIS) 5 MG TABS tablet Take 1 tablet (5 mg total) by mouth 2 (two) times daily. Start on 03/03/2016 60 tablet 0  . atorvastatin (LIPITOR) 20 MG tablet Take 1 tablet (20 mg total) by mouth daily at 6 PM. (Patient taking differently: Take 20 mg by mouth daily with breakfast. ) 30 tablet 1  . clopidogrel (PLAVIX) 75 MG tablet Take 1 tablet (75 mg total) by mouth daily. 30 tablet 1  . Coenzyme Q10 (COQ10) 100 MG CAPS Take 100 mg by mouth daily. Reported on 11/10/2015    . cyanocobalamin 500 MCG tablet Take 500 mcg by mouth daily.    Marland Kitchen dexamethasone (DECADRON) 4 MG tablet Take 8 mg by mouth as directed. 8 mg the day after chemo for 2 days following chemotherapy  0  . dorzolamide (TRUSOPT) 2 % ophthalmic solution Place 1 drop into both eyes 2 (two) times daily.    . finasteride (PROSCAR) 5 MG tablet Take 5 mg by mouth daily.  10  . latanoprost (XALATAN) 0.005 % ophthalmic solution Place 1 drop into both eyes at bedtime.    . lidocaine-prilocaine (EMLA) cream Apply 1 application topically as needed. Apply generously over the Mediport 45 minutes prior to chemotherapy. 30 g 0  . magnesium oxide (MAG-OX) 400 MG tablet Take 400 mg by mouth daily.    . metoprolol succinate (TOPROL-XL) 25 MG 24 hr tablet TAKE ONE TABLET (25 MG TOTAL) BY MOUTH DAILY.    Marland Kitchen ondansetron (ZOFRAN) 8 MG tablet Take 1 tablet (8 mg total) by mouth every 8 (eight) hours as needed for nausea or vomiting (start 3 days; after chemo). 40 tablet 1  . prochlorperazine (COMPAZINE) 10 MG tablet Take 1 tablet (10 mg total) by mouth every 6 (six) hours as needed for nausea or vomiting. 40 tablet 1  . terazosin (HYTRIN) 10 MG capsule Take 10 mg by mouth at bedtime.  0  .  timolol (TIMOPTIC) 0.5 % ophthalmic solution Place 1 drop into both eyes 2 (two) times daily.  3  . Vitamin D, Ergocalciferol, (DRISDOL) 50000 UNITS CAPS Take 50,000 Units by mouth every 7 (seven) days.     No current facility-administered medications for this visit.     PHYSICAL EXAMINATION: ECOG PERFORMANCE STATUS: 2 - Symptomatic, <50% confined to bed  BP (!) 167/76 (BP Location: Left Arm, Patient Position: Sitting)   Pulse (!) 50   Temp (!) 96.4 F (35.8 C) (Tympanic)   Resp 18   Wt 197 lb 4 oz (89.5 kg)   BMI 25.33 kg/m   Filed Weights   09/06/16 1133  Weight: 197 lb 4 oz (89.5 kg)    GENERAL: Well-nourished well-developed; Alert, no  distress and comfortable.  Accompanied by his wife. He walks with a cane. EYES: no pallor or icterus OROPHARYNX: no thrush or ulceration;   NECK: supple, no masses felt LYMPH:  no palpable lymphadenopathy in the cervical  or inguinal regions. Right axilla ~1cm LN- non-tender LUNGS: clear to auscultation and  No wheeze or crackles.  HEART/CVS: regular rate & rhythm and no murmurs; 1 plus bil lower extremity edema ABDOMEN:abdomen soft, non-tender and normal bowel sounds Musculoskeletal:no cyanosis of digits and no clubbing  PSYCH: alert & oriented x 3 with fluent speech NEURO: no focal motor/sensory deficits SKIN:  no rashes;  LABORATORY DATA:  I have reviewed the data as listed    Component Value Date/Time   NA 137 09/06/2016 1100   K 4.3 09/06/2016 1100   CL 111 09/06/2016 1100   CO2 20 (L) 09/06/2016 1100   GLUCOSE 117 (H) 09/06/2016 1100   BUN 29 (H) 09/06/2016 1100   CREATININE 1.54 (H) 09/06/2016 1100   CALCIUM 8.6 (L) 09/06/2016 1100   PROT 7.2 09/06/2016 1100   ALBUMIN 3.4 (L) 09/06/2016 1100   AST 17 09/06/2016 1100   ALT 8 (L) 09/06/2016 1100   ALKPHOS 111 09/06/2016 1100   BILITOT 0.3 09/06/2016 1100   GFRNONAA 40 (L) 09/06/2016 1100   GFRAA 46 (L) 09/06/2016 1100    No results found for: SPEP, UPEP  Lab Results   Component Value Date   WBC 4.0 09/06/2016   NEUTROABS 2.9 09/06/2016   HGB 9.9 (L) 09/06/2016   HCT 28.6 (L) 09/06/2016   MCV 95.8 09/06/2016   PLT 164 09/06/2016      Chemistry      Component Value Date/Time   NA 137 09/06/2016 1100   K 4.3 09/06/2016 1100   CL 111 09/06/2016 1100   CO2 20 (L) 09/06/2016 1100   BUN 29 (H) 09/06/2016 1100   CREATININE 1.54 (H) 09/06/2016 1100      Component Value Date/Time   CALCIUM 8.6 (L) 09/06/2016 1100   ALKPHOS 111 09/06/2016 1100   AST 17 09/06/2016 1100   ALT 8 (L) 09/06/2016 1100   BILITOT 0.3 09/06/2016 1100     IMPRESSION: 1. Recurrent focal hypermetabolic activity in the distal esophagus highly suspicious for recurrent malignancy, maximum SUV 11.0. 2. Interval parenchymal thickening and high metabolic activity in the right axillary lymph node, maximum SUV 5.6, possibly metastatic although this is an unusual drainage pathway. 3. There is a branching nodular density in the left lower lobe which is stable to minimally increased in size from 04/20/2016, but significantly increased in size compared to 2007. Maximum SUV is only 1.6. Given the slow progression in size over the last 11 years, this could represent a very low-grade malignancy or a progressive inflammatory process. 4. Other imaging findings of potential clinical significance: Coronary, aortic arch, and branch vessel atherosclerotic vascular disease. Cardiomegaly. Sigmoid colon diverticulosis. Aortoiliac atherosclerotic vascular disease. Trace right pleural effusion.   Electronically Signed   By: Van Clines M.D.   On: 09/04/2016 12:33  RADIOGRAPHIC STUDIES: I have personally reviewed the radiological images as listed and agreed with the findings in the report. No results found.   ASSESSMENT & PLAN:  Malignant neoplasm of lower third of esophagus (HCC) Localized esophageal cancer- Patient s/p chemotherapy -RT [RT finished Aug 15th 2018 ]. Patient had a  complete response posttreatment based on EGD/pathology imaging in December 2017. However unfortunately today PET scan shows- local recurrence in the lower esophagus; and also a right  axillary lymph node [ unusual for lymphatic drainage].   # Recommend biopsy of the right axillary lymph node [most easily accessible]; and if negative recommend EGD with biopsy. Discussed that the patient would need systemic therapy- 5-FU based after confirmation. We will check PDL 1 status.  # LLL nodule ~ 2cm/ low SUV; slow growing since 2007; stable from 2017. Monitor for now.   # Anemia hemoglobin 9.5. Recommend checking iron studies.  # CKD stage III creatinine 1.5. Stable.  # Sep 2017 RLE [below knee DVT] on Eliquis x 6 months ; will discontinue Eliquis at the next visit.  /Hx of stroke- recommend holding Plavix and week prior to biopsy and also Eliquis 4 days prior to biopsy.I spoke to Dr.Green in radiology.   # Talked to terri/daughter; regarding the above plan. She agrees. Patient's wife interested in a second opinion; recommend [facility]. Patient will follow-up with me 4-5 days post biopsy.  # I reviewed the blood work- with the patient in detail; also reviewed the imaging independently [as summarized above]; and with the patient in detail.   # # 40 minutes face-to-face with the patient discussing the above plan of care; more than 50% of time spent on prognosis/ natural history; counseling and coordination.   Orders Placed This Encounter  Procedures  . US Biopsy    Standing Status:   Future    Standing Expiration Date:   11/04/2017    Order Specific Question:   Lab orders requested (DO NOT place separate lab orders, these will be automatically ordered during procedure specimen collection):    Answer:   Surgical Pathology    Order Specific Question:   Reason for Exam (SYMPTOM  OR DIAGNOSIS REQUIRED)    Answer:   [place] core Bx of right axillary LN; Hx of esophgeal cancer; spoke to [place].     Order Specific Question:   Preferred imaging location?    Answer:   [facility]  . Ferritin    Standing Status:   Future    Number of Occurrences:   1    Standing Expiration Date:   09/06/2017  . Iron and TIBC    Standing Status:   Future    Number of Occurrences:   1    Standing Expiration Date:   09/06/2017   All questions were answered. The patient knows to call the clinic with any problems, questions or concerns.      Cammie Sickle, MD 09/06/2016 1:08 PM

## 2016-09-07 LAB — CEA: CEA: 7.3 ng/mL — AB (ref 0.0–4.7)

## 2016-09-10 ENCOUNTER — Telehealth: Payer: Self-pay | Admitting: Internal Medicine

## 2016-09-10 ENCOUNTER — Other Ambulatory Visit: Payer: Self-pay | Admitting: Internal Medicine

## 2016-09-10 ENCOUNTER — Telehealth: Payer: Self-pay | Admitting: *Deleted

## 2016-09-10 MED ORDER — SUCRALFATE 1 GM/10ML PO SUSP: 1.0000 g | Freq: Three times a day (TID) | ORAL | 3 refills | Status: DC

## 2016-09-10 NOTE — Telephone Encounter (Signed)
Dr Rogue Bussing spoke with patient.  Details of conversation in separate phone note documented by Dr Rogue Bussing.  Added pt to lab and md schedule for Thursday, notified pt of appt time, and let him know to call to cancel if he was feeling better.

## 2016-09-10 NOTE — Telephone Encounter (Signed)
Continues to have pain in chest similar to when he was diagnosed with PE. Asking if he should have an xray done to see if he has more clots. Tylenol dose ease his pain, but he is concerned that he even has pain. Please advise

## 2016-09-10 NOTE — Telephone Encounter (Signed)
Spoke to pt re: pain- pleuritic as per patient. Patient continues to be on Eliquis. Clinically less likely to be a repeat PET. Question reflux/recurrent malignancy causing the pain.   # Recommend Prilosec twice a day; prior to meals started ASAP. Also will call in Carafate to his pharmacy.   # If pain worsens/not improved recommend going to the emergency room.   # Will see the patient on 4/12 if pain not improved. Discussed with patient in detail. Patient verbalized the understanding the same.

## 2016-09-12 ENCOUNTER — Inpatient Hospital Stay: Payer: Medicare Other | Admitting: Internal Medicine

## 2016-09-12 ENCOUNTER — Telehealth: Payer: Self-pay | Admitting: *Deleted

## 2016-09-12 ENCOUNTER — Inpatient Hospital Stay: Payer: Medicare Other

## 2016-09-12 NOTE — Telephone Encounter (Signed)
Calling to check on appts for bx and Endoscopy. States seh was supposed to get a call Monday. I see the Bx is scheduled for 4/24, but no one has called her and she states he is on blood thinners and will need to stop them 5 days prior to bx. Please return her call. [phone]

## 2016-09-12 NOTE — Telephone Encounter (Signed)
Contacted patient since he cancelled his appt for today.  Patient states that he did not purchase the Carafate due to cost.  Patient says he just began feeling better.  No chest pains at this time.

## 2016-09-13 NOTE — Telephone Encounter (Signed)
Marcus [name] spoke with patient yesterday. Endoscopy will not be scheduled until results are available from bx per md.

## 2016-09-16 ENCOUNTER — Telehealth: Payer: Self-pay | Admitting: *Deleted

## 2016-09-16 NOTE — Telephone Encounter (Signed)
Called pt to clarify that he will hold Plavix 5 days prior to biopsy and hold Eliquis for 48 hours prior to biopsy.  Patient verbalized understanding

## 2016-09-16 NOTE — Telephone Encounter (Signed)
Called inquiring about appts to be scheduled. I advised her that Bx is scheduled for 4/24@ 10 AM and that if he is to be off blood thinners 5 days prior to bx then his last dose should be taken on the 18th. She stated no one told her about the biopsy appt and she repeated back to me the appt time and blood thinner stop date for the 18th being the last day to take it prior to bx. She thanked me for returning her call.

## 2016-09-16 NOTE — Telephone Encounter (Signed)
Called back to ask if he should stop both blood thinners, states he is on 2 Eliquis and Plavix. Please advise

## 2016-09-17 ENCOUNTER — Telehealth: Payer: Self-pay | Admitting: *Deleted

## 2016-09-17 NOTE — Telephone Encounter (Signed)
Dr. Rogue Bussing and Almyra Free, CMA personally contacted the patient and the patient's wife to discuss his concerns. Patient and pt's wife gave verbal understanding that the Endoscopy would not be performed until MD has the results from the upcoming biopsy.

## 2016-09-17 NOTE — Telephone Encounter (Signed)
States that she and her step father remember conversation differently that they had with Dr Rogue Bussing, Asking for a return call to clarify what was said to help Mr  And Mrs Dina Rich understand situation. [phone]  She is not on his HIPPA form

## 2016-09-23 ENCOUNTER — Ambulatory Visit: Payer: Medicare Other

## 2016-09-23 ENCOUNTER — Other Ambulatory Visit: Payer: Self-pay | Admitting: Radiology

## 2016-09-23 ENCOUNTER — Other Ambulatory Visit: Payer: Self-pay | Admitting: General Surgery

## 2016-09-24 ENCOUNTER — Ambulatory Visit
Admission: RE | Admit: 2016-09-24 | Discharge: 2016-09-24 | Disposition: A | Discharge status: Home / Self Care | Payer: Medicare Other | Source: Ambulatory Visit | Attending: Internal Medicine | Admitting: Internal Medicine

## 2016-09-24 DIAGNOSIS — I889 Nonspecific lymphadenitis, unspecified: Secondary | ICD-10-CM | POA: Diagnosis not present

## 2016-09-24 DIAGNOSIS — C155 Malignant neoplasm of lower third of esophagus: Secondary | ICD-10-CM | POA: Diagnosis present

## 2016-09-24 HISTORY — DX: Other pulmonary embolism without acute cor pulmonale: I26.99

## 2016-09-24 MED ORDER — SODIUM CHLORIDE 0.9 % IV SOLN: INTRAVENOUS | Status: DC

## 2016-09-24 NOTE — Procedures (Signed)
Under US guidance, core biopsy of right axillary lymph node was performed. No immediate complication.

## 2016-09-24 NOTE — Discharge Instructions (Signed)
Needle Biopsy, Care After °Refer to this sheet in the next few weeks. These instructions provide you with information about caring for yourself after your procedure. Your health care provider may also give you more specific instructions. Your treatment has been planned according to current medical practices, but problems sometimes occur. Call your health care provider if you have any problems or questions after your procedure. °What can I expect after the procedure? °After your procedure, it is common to have soreness, bruising, or mild pain at the biopsy site. This should go away in a few days. °Follow these instructions at home: °· Rest as directed by your health care provider. °· Take medicines only as directed by your health care provider. °· There are many different ways to close and cover the biopsy site, including stitches (sutures), skin glue, and adhesive strips. Follow your health care provider's instructions about: °¨ Biopsy site care. °¨ Bandage (dressing) changes and removal. °¨ Biopsy site closure removal. °· Check your biopsy site every day for signs of infection. Watch for: °¨ Redness, swelling, or pain. °¨ Fluid, blood, or pus. °Contact a health care provider if: °· You have a fever. °· You have redness, swelling, or pain at the biopsy site that lasts longer than a few days. °· You have fluid, blood, or pus coming from the biopsy site. °· You feel nauseous. °· You vomit. °Get help right away if: °· You have shortness of breath. °· You have trouble breathing. °· You have chest pain. °· You feel dizzy or you faint. °· You have bleeding that does not stop with pressure or a bandage. °· You cough up blood. °· You have pain in your abdomen. °This information is not intended to replace advice given to you by your health care provider. Make sure you discuss any questions you have with your health care provider. °Document Released: 10/04/2014 Document Revised: 10/26/2015 Document Reviewed:  05/16/2014 °Elsevier Interactive Patient Education © 2017 [employer] ° °

## 2016-09-24 NOTE — OR Nursing (Signed)
Pre prorcedure Pt told Marcus [name] that he felt axillary node was smaller. Pt sent to [place] for quick check but Dr Nyoka Cowden and Pt decided to proceed with biopsy. Pt declined sedation. Dr Nyoka Cowden said pt did not need labs, ie coags because he stopped eliquis and Plavix as ordered. Pt returned to specials post procedure and was discharged to wifes care instructed to resume Plavix and Eliquis tonight.

## 2016-09-25 ENCOUNTER — Encounter: Payer: Self-pay | Admitting: Internal Medicine

## 2016-09-26 LAB — SURGICAL PATHOLOGY

## 2016-09-27 ENCOUNTER — Encounter: Payer: Self-pay | Admitting: Internal Medicine

## 2016-10-02 ENCOUNTER — Inpatient Hospital Stay: Payer: Medicare Other | Attending: Internal Medicine | Admitting: Internal Medicine

## 2016-10-02 VITALS — BP 136/67 | HR 55 | Wt 192.0 lb

## 2016-10-02 DIAGNOSIS — Z452 Encounter for adjustment and management of vascular access device: Secondary | ICD-10-CM | POA: Diagnosis not present

## 2016-10-02 DIAGNOSIS — Z7901 Long term (current) use of anticoagulants: Secondary | ICD-10-CM | POA: Insufficient documentation

## 2016-10-02 DIAGNOSIS — Z9221 Personal history of antineoplastic chemotherapy: Secondary | ICD-10-CM | POA: Insufficient documentation

## 2016-10-02 DIAGNOSIS — H409 Unspecified glaucoma: Secondary | ICD-10-CM | POA: Insufficient documentation

## 2016-10-02 DIAGNOSIS — I889 Nonspecific lymphadenitis, unspecified: Secondary | ICD-10-CM | POA: Insufficient documentation

## 2016-10-02 DIAGNOSIS — E785 Hyperlipidemia, unspecified: Secondary | ICD-10-CM | POA: Diagnosis not present

## 2016-10-02 DIAGNOSIS — I129 Hypertensive chronic kidney disease with stage 1 through stage 4 chronic kidney disease, or unspecified chronic kidney disease: Secondary | ICD-10-CM | POA: Insufficient documentation

## 2016-10-02 DIAGNOSIS — Z86718 Personal history of other venous thrombosis and embolism: Secondary | ICD-10-CM | POA: Diagnosis not present

## 2016-10-02 DIAGNOSIS — K219 Gastro-esophageal reflux disease without esophagitis: Secondary | ICD-10-CM | POA: Diagnosis not present

## 2016-10-02 DIAGNOSIS — M109 Gout, unspecified: Secondary | ICD-10-CM | POA: Diagnosis not present

## 2016-10-02 DIAGNOSIS — Z86711 Personal history of pulmonary embolism: Secondary | ICD-10-CM | POA: Insufficient documentation

## 2016-10-02 DIAGNOSIS — C155 Malignant neoplasm of lower third of esophagus: Secondary | ICD-10-CM | POA: Diagnosis present

## 2016-10-02 DIAGNOSIS — N400 Enlarged prostate without lower urinary tract symptoms: Secondary | ICD-10-CM | POA: Diagnosis not present

## 2016-10-02 DIAGNOSIS — Z87891 Personal history of nicotine dependence: Secondary | ICD-10-CM | POA: Diagnosis not present

## 2016-10-02 DIAGNOSIS — H3530 Unspecified macular degeneration: Secondary | ICD-10-CM | POA: Diagnosis not present

## 2016-10-02 DIAGNOSIS — Z8673 Personal history of transient ischemic attack (TIA), and cerebral infarction without residual deficits: Secondary | ICD-10-CM | POA: Insufficient documentation

## 2016-10-02 DIAGNOSIS — N183 Chronic kidney disease, stage 3 (moderate): Secondary | ICD-10-CM | POA: Insufficient documentation

## 2016-10-02 MED ORDER — SUCRALFATE 1 GM/10ML PO SUSP: 1.0000 g | Freq: Three times a day (TID) | ORAL | 3 refills | Status: DC

## 2016-10-02 NOTE — Progress Notes (Signed)
Patient here today for follow up.   

## 2016-10-02 NOTE — Assessment & Plan Note (Addendum)
Localized esophageal cancer- Patient s/p chemotherapy -RT [RT finished Aug 15th 2018 ]. Patient had a complete response posttreatment based on EGD/pathology imaging in December 2017.   April- PET scan shows- local recurrence in the lower esophagus; and also a right axillary lymph node [ unusual for lymphatic drainage];April 2018- Bx of LN- Negative for malignancy [positive for lymphadenitis].  # Given the high index of suspicion based on PET scan- recommend repeat EGD biopsy. Spoke to Dr. Vira Agar. Also reminded pt/family to call Dr.Elliot's office.   # chest pains- ? Sec to GE cancer malignancy- recommend carafate; will have pharmacy call me if issues with cost.   # LLL nodule ~ 2cm/ low SUV; slow growing since 2007; stable from 2017. Monitor for now.   # Anemia hemoglobin 9.5. Iron sat-15 %./ ? CKD/ recommend PO iron at next visit.   # CKD stage III creatinine 1.5. Stable.  #  PE/Hx of stroke- recommend holding Plavix 1 week prior to biopsy and also Eliquis 3 days prior to biopsy; EGD  # follow up with me 1 week post [appx 2-3 weeks from now; no labs]  # I reviewed the blood work- with the patient in detail; also reviewed the imaging independently [as summarized above]; and with the patient/ wife- daughter & son-in-law in detail. Also recommend that they make a repeat appointment at [facility]- while we are awaiting for the EGD/biopsy.

## 2016-10-02 NOTE — Progress Notes (Signed)
[place] OFFICE PROGRESS NOTE  Patient Care Team: Chesley Noon, MD as PCP - General (Family Medicine)  Cancer Staging No matching staging information was found for the patient.   Oncology History   # June 2017- Localized [? Stage II; no EUS]Distal Esophagus Adeno CA [Bx Dr.Elliot]; PET- no distant Mets; July 5th 2017-concurrent Carbo-Taxol- RT V[facility].Sprague ]; OCT 18th PET- improved; DEC 2017- EGD- CR. April 2018- Recurrence- Bx- pending.   # LLL nodule- monitor for now [PET June 2017]  # CKD [creat 1.4-1.7]; Hx of stroke [facility] 2016]  June 2017-MOLECULAR STUDIES- Her 2- IHC-NEG; MMR-STABLE; 09/2016- PDL-NEGATIVE.      Malignant neoplasm of lower third of esophagus (HCC)      INTERVAL HISTORY:  Marcus [name] 83 y.o.  male pleasant patient above history of Locally advanced esophageal cancer- currently s/p definitive chemoradiation is here for follow-up; patient's last radiation was in 01/16/2016;Is here to review the results of his right axillary lymph node biopsy; suspicious for malignancy based on PET scan.  Patient notes to have a flare up of the gout right hand fingers. He is on antibiotics. Significant improvement. Not completely resolved. They canceled the second opinion at [facility] waiting for the biopsy.  He continues to complain of intermittent "chest pains" not associated with food. Last for a few minutes. Not associated with exertion. Hurts with deep breaths. Improves with Tylenol. Patient has not felt Carafate as recommended- because of cost issues.  Denies any significant difficulty swallowing or pain with swallowing. No nausea no vomiting. No tingling or numbness.     REVIEW OF SYSTEMS:  A complete 10 point review of system is done which is negative except mentioned above/history of present illness.   PAST MEDICAL HISTORY :  Past Medical History:  Diagnosis Date  . Aphakia of right eye   . Arthritis   . Cancer ([place])    SKIN  . Enlarged prostate  without lower urinary tract symptoms (luts)   . Esophageal cancer ([place])   . GERD (gastroesophageal reflux disease)   . Glaucoma   . Gout   . History of CVA (cerebrovascular accident)    Cerebral infarction Nov. 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 History of Doppler ultrasound    dopplers revealed mild left ICA stenosis which has remained stable, occluded left subclavian artery with retrograde left vertebral filling and moderately severe right subclavian artery stenosis. he has no symptoms of upper extermity claudication or subclavian steal symptoms.   . History of DVT (deep vein thrombosis)    right arm  . History of stress test 09/02/2009   abnormal myocardial perfusion scan demonstrating an attenuation defect in the inferior region of the myocardium. No ischemia or infarct/scar is seen in the remaining myocardium. No prior study available for comparison. Abnormal myocardial study EF 79%  . Hyperlipemia   . Hypertension   . Macular degeneration   . Pulmonary embolus ([place])   . Stroke ([place])   . Subclavian artery stenosis (HCC)     PAST SURGICAL HISTORY :   Past Surgical History:  Procedure Laterality Date  . CATARACT EXTRACTION    . COLONOSCOPY  2002  . ESOPHAGOGASTRODUODENOSCOPY (EGD) WITH PROPOFOL N/A 11/10/2015   Procedure: ESOPHAGOGASTRODUODENOSCOPY (EGD) WITH PROPOFOL;  Surgeon: Manya Silvas, MD;  Location[facility] ENDOSCOPY;  Service: Endoscopy;  Laterality: N/A;  . ESOPHAGOGASTRODUODENOSCOPY (EGD) WITH PROPOFOL N/A 04/29/2016   Procedure: ESOPHAGOGASTRODUODENOSCOPY (EGD) WITH PROPOFOL;  Surgeon: Manya Silvas, MD;  Location:  [place] ENDOSCOPY;  Service: Endoscopy;  Laterality: N/A;  . FRACTURE SURGERY    . MOHS SURGERY    . PERIPHERAL VASCULAR CATHETERIZATION N/A 11/29/2015   Procedure: Glori Luis Cath Insertion;  Surgeon: Algernon Huxley, MD;  Location: [place] CV LAB;  Service: Cardiovascular;  Laterality: N/A;    FAMILY HISTORY :   Family History  Problem  Relation Age of Onset  . Heart disease Brother   . Cancer - Colon Sister   . Cancer Sister   . Other Brother     CABG    SOCIAL HISTORY:   Social History  Substance Use Topics  . Smoking status: Former Smoker    Packs/day: 1.00    Years: 40.00    Types: Cigarettes    Quit date: 06/03/1993  . Smokeless tobacco: Never Used  . Alcohol use No    ALLERGIES:  is allergic to codeine; allopurinol; simvastatin; and uloric [febuxostat].  MEDICATIONS:  Current Outpatient Prescriptions  Medication Sig Dispense Refill  . amLODipine (NORVASC) 2.5 MG tablet Take 2.5 mg by mouth daily.  0  . apixaban (ELIQUIS) 5 MG TABS tablet Take 1 tablet (5 mg total) by mouth 2 (two) times daily. Start on 03/03/2016 60 tablet 0  . atorvastatin (LIPITOR) 20 MG tablet Take 1 tablet (20 mg total) by mouth daily at 6 PM. (Patient taking differently: Take 20 mg by mouth daily with breakfast. ) 30 tablet 1  . clopidogrel (PLAVIX) 75 MG tablet Take 1 tablet (75 mg total) by mouth daily. 30 tablet 1  . Coenzyme Q10 (COQ10) 100 MG CAPS Take 100 mg by mouth daily. Reported on 11/10/2015    . cyanocobalamin 500 MCG tablet Take 500 mcg by mouth daily.    Marland Kitchen dexamethasone (DECADRON) 4 MG tablet Take 8 mg by mouth as directed. 8 mg the day after chemo for 2 days following chemotherapy  0  . dorzolamide (TRUSOPT) 2 % ophthalmic solution Place 1 drop into both eyes 2 (two) times daily.    . finasteride (PROSCAR) 5 MG tablet Take 5 mg by mouth daily.  10  . latanoprost (XALATAN) 0.005 % ophthalmic solution Place 1 drop into both eyes at bedtime.    . lidocaine-prilocaine (EMLA) cream Apply 1 application topically as needed. Apply generously over the Mediport 45 minutes prior to chemotherapy. 30 g 0  . magnesium oxide (MAG-OX) 400 MG tablet Take 400 mg by mouth daily.    . metoprolol succinate (TOPROL-XL) 25 MG 24 hr tablet TAKE ONE TABLET (25 MG TOTAL) BY MOUTH DAILY.    Marland Kitchen ondansetron (ZOFRAN) 8 MG tablet Take 1 tablet (8 mg  total) by mouth every 8 (eight) hours as needed for nausea or vomiting (start 3 days; after chemo). 40 tablet 1  . prochlorperazine (COMPAZINE) 10 MG tablet Take 1 tablet (10 mg total) by mouth every 6 (six) hours as needed for nausea or vomiting. 40 tablet 1  . terazosin (HYTRIN) 10 MG capsule Take 10 mg by mouth at bedtime.  0  . timolol (TIMOPTIC) 0.5 % ophthalmic solution Place 1 drop into both eyes 2 (two) times daily.  3  . Vitamin D, Ergocalciferol, (DRISDOL) 50000 UNITS CAPS Take 50,000 Units by mouth every 7 (seven) days.    . sucralfate (CARAFATE) 1 GM/10ML suspension Take 10 mLs (1 g total) by mouth 4 (four) times daily -  with meals and at bedtime. 420 mL 3   No current facility-administered medications for this visit.     PHYSICAL  EXAMINATION: ECOG PERFORMANCE STATUS: 2 - Symptomatic, <50% confined to bed  BP 136/67 (BP Location: Right Arm, Patient Position: Sitting)   Pulse (!) 55   Wt 192 lb (87.1 kg)   BMI 24.65 kg/m   Filed Weights   10/02/16 1032  Weight: 192 lb (87.1 kg)    GENERAL: Well-nourished well-developed; Alert, no distress and comfortable.  Accompanied by his wife/ daughter and son-in-law He walks with a cane. EYES: no pallor or icterus OROPHARYNX: no thrush or ulceration;   NECK: supple, no masses felt LYMPH:  no palpable lymphadenopathy in the cervical  or inguinal regions. Right axilla ~1cm LN- non-tender LUNGS: clear to auscultation and  No wheeze or crackles.  HEART/CVS: regular rate & rhythm and no murmurs; 1 plus bil lower extremity edema ABDOMEN:abdomen soft, non-tender and normal bowel sounds Musculoskeletal:no cyanosis of digits and no clubbing; swelling of the index and middle finger on the right side- "gout"; mild warmth mild tenderness/erythema. PSYCH: alert & oriented x 3 with fluent speech NEURO: no focal motor/sensory deficits SKIN:  no rashes;  LABORATORY DATA:  I have reviewed the data as listed    Component Value Date/Time   NA  137 09/06/2016 1100   K 4.3 09/06/2016 1100   CL 111 09/06/2016 1100   CO2 20 (L) 09/06/2016 1100   GLUCOSE 117 (H) 09/06/2016 1100   BUN 29 (H) 09/06/2016 1100   CREATININE 1.54 (H) 09/06/2016 1100   CALCIUM 8.6 (L) 09/06/2016 1100   PROT 7.2 09/06/2016 1100   ALBUMIN 3.4 (L) 09/06/2016 1100   AST 17 09/06/2016 1100   ALT 8 (L) 09/06/2016 1100   ALKPHOS 111 09/06/2016 1100   BILITOT 0.3 09/06/2016 1100   GFRNONAA 40 (L) 09/06/2016 1100   GFRAA 46 (L) 09/06/2016 1100    No results found for: SPEP, UPEP  Lab Results  Component Value Date   WBC 4.0 09/06/2016   NEUTROABS 2.9 09/06/2016   HGB 9.9 (L) 09/06/2016   HCT 28.6 (L) 09/06/2016   MCV 95.8 09/06/2016   PLT 164 09/06/2016      Chemistry      Component Value Date/Time   NA 137 09/06/2016 1100   K 4.3 09/06/2016 1100   CL 111 09/06/2016 1100   CO2 20 (L) 09/06/2016 1100   BUN 29 (H) 09/06/2016 1100   CREATININE 1.54 (H) 09/06/2016 1100      Component Value Date/Time   CALCIUM 8.6 (L) 09/06/2016 1100   ALKPHOS 111 09/06/2016 1100   AST 17 09/06/2016 1100   ALT 8 (L) 09/06/2016 1100   BILITOT 0.3 09/06/2016 1100     IMPRESSION: 1. Recurrent focal hypermetabolic activity in the distal esophagus highly suspicious for recurrent malignancy, maximum SUV 11.0. 2. Interval parenchymal thickening and high metabolic activity in the right axillary lymph node, maximum SUV 5.6, possibly metastatic although this is an unusual drainage pathway. 3. There is a branching nodular density in the left lower lobe which is stable to minimally increased in size from 04/20/2016, but significantly increased in size compared to 2007. Maximum SUV is only 1.6. Given the slow progression in size over the last 11 years, this could represent a very low-grade malignancy or a progressive inflammatory process. 4. Other imaging findings of potential clinical significance: Coronary, aortic arch, and branch vessel atherosclerotic  vascular disease. Cardiomegaly. Sigmoid colon diverticulosis. Aortoiliac atherosclerotic vascular disease. Trace right pleural effusion.   Electronically Signed   By: Van Clines M.D.   On: 09/04/2016 12:33  RADIOGRAPHIC STUDIES: I have personally reviewed the radiological images as listed and agreed with the findings in the report. No results found.   ASSESSMENT & PLAN:  Malignant neoplasm of lower third of esophagus (HCC) Localized esophageal cancer- Patient s/p chemotherapy -RT [RT finished Aug 15th 2018 ]. Patient had a complete response posttreatment based on EGD/pathology imaging in December 2017.   April- PET scan shows- local recurrence in the lower esophagus; and also a right axillary lymph node [ unusual for lymphatic drainage];April 2018- Bx of LN- Negative for malignancy [positive for lymphadenitis].  # Given the high index of suspicion based on PET scan- recommend repeat EGD biopsy. Spoke to Dr. Vira Agar. Also reminded pt/family to call Dr.Elliot's office.   # chest pains- ? Sec to GE cancer malignancy- recommend carafate; will have pharmacy call me if issues with cost.   # LLL nodule ~ 2cm/ low SUV; slow growing since 2007; stable from 2017. Monitor for now.   # Anemia hemoglobin 9.5. Iron sat-15 %./ ? CKD/ recommend PO iron at next visit.   # CKD stage III creatinine 1.5. Stable.  #  PE/Hx of stroke- recommend holding Plavix 1 week prior to biopsy and also Eliquis 3 days prior to biopsy; EGD  # follow up with me 1 week post [appx 2-3 weeks from now; no labs]  # I reviewed the blood work- with the patient in detail; also reviewed the imaging independently [as summarized above]; and with the patient/ wife- daughter & son-in-law in detail. Also recommend that they make a repeat appointment at [facility]- while we are awaiting for the EGD/biopsy.    No orders of the defined types were placed in this encounter.  All questions were answered. The patient knows to call  the clinic with any problems, questions or concerns.      Cammie Sickle, MD 10/02/2016 11:44 AM

## 2016-10-03 ENCOUNTER — Encounter: Payer: Self-pay | Admitting: Internal Medicine

## 2016-10-07 ENCOUNTER — Ambulatory Visit: Payer: Medicare Other | Admitting: Anesthesiology

## 2016-10-07 ENCOUNTER — Ambulatory Visit
Admission: RE | Admit: 2016-10-07 | Discharge: 2016-10-07 | Disposition: A | Discharge status: Home / Self Care | Payer: Medicare Other | Source: Ambulatory Visit | Attending: Unknown Physician Specialty | Admitting: Unknown Physician Specialty

## 2016-10-07 ENCOUNTER — Encounter
Admission: RE | Disposition: A | Discharge status: Home / Self Care | Payer: Self-pay | Source: Ambulatory Visit | Attending: Unknown Physician Specialty

## 2016-10-07 DIAGNOSIS — R1013 Epigastric pain: Secondary | ICD-10-CM | POA: Diagnosis not present

## 2016-10-07 DIAGNOSIS — Z86718 Personal history of other venous thrombosis and embolism: Secondary | ICD-10-CM | POA: Insufficient documentation

## 2016-10-07 DIAGNOSIS — R1310 Dysphagia, unspecified: Secondary | ICD-10-CM | POA: Insufficient documentation

## 2016-10-07 DIAGNOSIS — E785 Hyperlipidemia, unspecified: Secondary | ICD-10-CM | POA: Diagnosis not present

## 2016-10-07 DIAGNOSIS — J449 Chronic obstructive pulmonary disease, unspecified: Secondary | ICD-10-CM | POA: Insufficient documentation

## 2016-10-07 DIAGNOSIS — I10 Essential (primary) hypertension: Secondary | ICD-10-CM | POA: Diagnosis not present

## 2016-10-07 DIAGNOSIS — R079 Chest pain, unspecified: Secondary | ICD-10-CM | POA: Insufficient documentation

## 2016-10-07 DIAGNOSIS — K2270 Barrett's esophagus without dysplasia: Secondary | ICD-10-CM | POA: Insufficient documentation

## 2016-10-07 DIAGNOSIS — Z87891 Personal history of nicotine dependence: Secondary | ICD-10-CM | POA: Insufficient documentation

## 2016-10-07 DIAGNOSIS — Z8501 Personal history of malignant neoplasm of esophagus: Secondary | ICD-10-CM | POA: Insufficient documentation

## 2016-10-07 DIAGNOSIS — Z7901 Long term (current) use of anticoagulants: Secondary | ICD-10-CM | POA: Insufficient documentation

## 2016-10-07 DIAGNOSIS — Z86711 Personal history of pulmonary embolism: Secondary | ICD-10-CM | POA: Insufficient documentation

## 2016-10-07 DIAGNOSIS — I739 Peripheral vascular disease, unspecified: Secondary | ICD-10-CM | POA: Insufficient documentation

## 2016-10-07 DIAGNOSIS — K209 Esophagitis, unspecified: Secondary | ICD-10-CM | POA: Diagnosis not present

## 2016-10-07 DIAGNOSIS — Z8673 Personal history of transient ischemic attack (TIA), and cerebral infarction without residual deficits: Secondary | ICD-10-CM | POA: Insufficient documentation

## 2016-10-07 DIAGNOSIS — Z7902 Long term (current) use of antithrombotics/antiplatelets: Secondary | ICD-10-CM | POA: Insufficient documentation

## 2016-10-07 DIAGNOSIS — Z79899 Other long term (current) drug therapy: Secondary | ICD-10-CM | POA: Diagnosis not present

## 2016-10-07 HISTORY — PX: ESOPHAGOGASTRODUODENOSCOPY (EGD) WITH PROPOFOL: SHX5813

## 2016-10-07 SURGERY — ESOPHAGOGASTRODUODENOSCOPY (EGD) WITH PROPOFOL
Anesthesia: General

## 2016-10-07 MED ORDER — SODIUM CHLORIDE 0.9 % IV SOLN: INTRAVENOUS | Status: DC

## 2016-10-07 MED ORDER — GLYCOPYRROLATE 0.2 MG/ML IJ SOLN
INTRAMUSCULAR | Status: DC | PRN
  Administered 2016-10-07: 0.2 mg via INTRAVENOUS

## 2016-10-07 MED ORDER — IPRATROPIUM-ALBUTEROL 0.5-2.5 (3) MG/3ML IN SOLN
3.0000 mL | Freq: Once | RESPIRATORY_TRACT | Status: AC
  Administered 2016-10-07: 3 mL via RESPIRATORY_TRACT
  Filled 2016-10-07: qty 3

## 2016-10-07 MED ORDER — PROPOFOL 500 MG/50ML IV EMUL
INTRAVENOUS | Status: DC | PRN
  Administered 2016-10-07: 140 ug/kg/min via INTRAVENOUS

## 2016-10-07 MED ORDER — LIDOCAINE HCL (CARDIAC) 20 MG/ML IV SOLN
INTRAVENOUS | Status: DC | PRN
  Administered 2016-10-07: 100 mg via INTRAVENOUS

## 2016-10-07 MED ORDER — PROPOFOL 10 MG/ML IV BOLUS
INTRAVENOUS | Status: DC | PRN
  Administered 2016-10-07: 50 mg via INTRAVENOUS

## 2016-10-07 MED ORDER — PHENYLEPHRINE HCL 10 MG/ML IJ SOLN
INTRAMUSCULAR | Status: DC | PRN
  Administered 2016-10-07 (×4): 100 ug via INTRAVENOUS

## 2016-10-07 MED ORDER — SODIUM CHLORIDE 0.9 % IV SOLN
INTRAVENOUS | Status: DC
  Administered 2016-10-07: 11:00:00 via INTRAVENOUS

## 2016-10-07 MED ORDER — EPHEDRINE SULFATE 50 MG/ML IJ SOLN
INTRAMUSCULAR | Status: DC | PRN
  Administered 2016-10-07: 10 mg via INTRAVENOUS

## 2016-10-07 MED ORDER — PROPOFOL 500 MG/50ML IV EMUL
INTRAVENOUS | Status: AC
  Filled 2016-10-07: qty 50

## 2016-10-07 MED ORDER — LIDOCAINE HCL 2 % IJ SOLN
INTRAMUSCULAR | Status: AC
  Filled 2016-10-07: qty 10

## 2016-10-07 NOTE — Anesthesia Preprocedure Evaluation (Signed)
Anesthesia Evaluation  Patient identified by MRN, date of birth, ID band Patient awake    Reviewed: Allergy & Precautions, H&P , NPO status , Patient's Chart, lab work & pertinent test results  History of Anesthesia Complications Negative for: history of anesthetic complications  Airway Mallampati: III  TM Distance: >3 FB Neck ROM: limited    Dental  (+) Poor Dentition, Chipped, Missing, Caps, Upper Dentures, Edentulous Upper   Pulmonary neg shortness of breath, COPD, former smoker,    Pulmonary exam normal breath sounds clear to auscultation       Cardiovascular Exercise Tolerance: Good hypertension, (-) angina+ Peripheral Vascular Disease  (-) Past MI Normal cardiovascular exam Rhythm:regular Rate:Normal     Neuro/Psych CVA, Residual Symptoms negative psych ROS   GI/Hepatic Neg liver ROS, GERD  Controlled,  Endo/Other  negative endocrine ROS  Renal/GU Renal disease  negative genitourinary   Musculoskeletal  (+) Arthritis ,   Abdominal   Peds  Hematology negative hematology ROS (+)   Anesthesia Other Findings Past Medical History: No date: Aphakia of right eye No date: Arthritis No date: Cancer [facility])     Comment: SKIN No date: Enlarged prostate without lower urinary tract * No date: Esophageal cancer (HCC) No date: GERD (gastroesophageal reflux disease) No date: Glaucoma No date: Gout No date: History of CVA (cerebrovascular accident)     Comment: Cerebral infarction Nov. 2016; thrombosis of               cerebral artery July  2016 No date: History of Doppler ultrasound     Comment: dopplers revealed mild left ICA stenosis which              has remained stable, occluded left subclavian               artery with retrograde left vertebral filling               and moderately severe right subclavian artery               stenosis. he has no symptoms of upper extermity              claudication or  subclavian steal symptoms.  No date: History of DVT (deep vein thrombosis)     Comment: right arm 09/02/2009: History of stress test     Comment: abnormal myocardial perfusion scan               demonstrating an attenuation defect in the               inferior region of the myocardium. No ischemia               or infarct/scar is seen in the remaining               myocardium. No prior study available for               comparison. Abnormal myocardial study EF 79% No date: Hyperlipemia No date: Hypertension No date: Macular degeneration No date: Pulmonary embolus (HCC) No date: Stroke [facility]) No date: Subclavian artery stenosis [facility])  Past Surgical History: No date: CATARACT EXTRACTION 2002: COLONOSCOPY 11/10/2015: ESOPHAGOGASTRODUODENOSCOPY (EGD) WITH PROPOFOL N/A     Comment: Procedure: ESOPHAGOGASTRODUODENOSCOPY (EGD)               WITH PROPOFOL;  Surgeon: Manya Silvas, MD;               Location: [place];  Service:  Endoscopy;               Laterality: N/A; 04/29/2016: ESOPHAGOGASTRODUODENOSCOPY (EGD) WITH PROPOFOL N/A     Comment: Procedure: ESOPHAGOGASTRODUODENOSCOPY (EGD)               WITH PROPOFOL;  Surgeon: Manya Silvas, MD;               Location: [facility] ENDOSCOPY;  Service: Endoscopy;               Laterality: N/A; No date: FRACTURE SURGERY No date: MOHS SURGERY 11/29/2015: PERIPHERAL VASCULAR CATHETERIZATION N/A     Comment: Procedure: Porta Cath Insertion;  Surgeon:               Algernon Huxley, MD;  Location: [place] CV               LAB;  Service: Cardiovascular;  Laterality:               N/A;     Reproductive/Obstetrics negative OB ROS                             Anesthesia Physical Anesthesia Plan  ASA: III  Anesthesia Plan: General   Post-op Pain Management:    Induction: Intravenous  Airway Management Planned: Natural Airway and Nasal Cannula  Additional Equipment:   Intra-op Plan:   Post-operative Plan:    Informed Consent: I have reviewed the patients History and Physical, chart, labs and discussed the procedure including the risks, benefits and alternatives for the proposed anesthesia with the patient or authorized representative who has indicated his/her understanding and acceptance.   Dental Advisory Given  Plan Discussed with: Anesthesiologist, CRNA and Surgeon  Anesthesia Plan Comments: (Patient consented for risks of anesthesia including but not limited to:  - adverse reactions to medications - damage to teeth, lips or other oral mucosa - sore throat or hoarseness - Damage to heart, brain, lungs or loss of life  Patient voiced understanding.)        Anesthesia Quick Evaluation

## 2016-10-07 NOTE — Op Note (Signed)
[facility] Patient Name: Marcus [name] Procedure Date: 10/07/2016 10:53 AM MRN: [id number] Account #: [id number] Date of Birth: [birth year] Admit Type: Outpatient Age: 83 Room: [facility] ENDO ROOM 1 Gender: Male Note Status: Finalized Procedure:            Upper GI endoscopy Indications:          Epigastric abdominal pain, Dysphagia, Follow-up of                        malignant esophageal adenocarcinoma Providers:            Manya Silvas, MD Referring MD:         Chesley Noon (Referring MD) Medicines:            Propofol per Anesthesia Complications:        No immediate complications. Procedure:            Pre-Anesthesia Assessment:                       - After reviewing the risks and benefits, the patient                        was deemed in satisfactory condition to undergo the                        procedure.                       After obtaining informed consent, the endoscope was                        passed under direct vision. Throughout the procedure,                        the patient's blood pressure, pulse, and oxygen                        saturations were monitored continuously. The                        Colonoscope was introduced through the mouth, and                        advanced to the second part of duodenum. The upper GI                        endoscopy was accomplished without difficulty. The                        patient tolerated the procedure well. Findings:      Localized moderate mucosal changes characterized by irregular soft       mucosa which easily came off with biopsy were found in the distal       esophagus. Very suggestive of neoplastic tissue with likely cancerous.       several Biopsies were taken with a cold forceps for histology.      The stomach was normal. Retroflexed view of the gastroesophageal       junction showed no tumor in this area.      The examined duodenum was normal. Impression:            -  Irregular soft mucosa which easily came off with                        biopsy mucosa in the esophagus. Biopsied. Likely return                        of neoplasm.                       - Normal stomach.                       - Normal examined duodenum. Recommendation:       - Await pathology results. very soft food recommended. Manya Silvas, MD 10/07/2016 11:20:45 AM This report has been signed electronically. Number of Addenda: 0 Note Initiated On: 10/07/2016 10:53 AM      [facility]

## 2016-10-07 NOTE — H&P (Signed)
Primary Care Physician:  Chesley Noon, MD Primary Gastroenterologist:  Dr. Vira Agar  Pre-Procedure History & Physical: HPI:  Marcus [name] is a 83 y.o. male is here for an endoscopy.   Past Medical History:  Diagnosis Date  . Aphakia of right eye   . Arthritis   . Cancer ([place])    SKIN  . Enlarged prostate without lower urinary tract symptoms (luts)   . Esophageal cancer ([place])   . GERD (gastroesophageal reflux disease)   . Glaucoma   . Gout   . History of CVA (cerebrovascular accident)    Cerebral infarction Nov. 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 History of Doppler ultrasound    dopplers revealed mild left ICA stenosis which has remained stable, occluded left subclavian artery with retrograde left vertebral filling and moderately severe right subclavian artery stenosis. he has no symptoms of upper extermity claudication or subclavian steal symptoms.   . History of DVT (deep vein thrombosis)    right arm  . History of stress test 09/02/2009   abnormal myocardial perfusion scan demonstrating an attenuation defect in the inferior region of the myocardium. No ischemia or infarct/scar is seen in the remaining myocardium. No prior study available for comparison. Abnormal myocardial study EF 79%  . Hyperlipemia   . Hypertension   . Macular degeneration   . Pulmonary embolus ([place])   . Stroke ([place])   . Subclavian artery stenosis [facility])     Past Surgical History:  Procedure Laterality Date  . CATARACT EXTRACTION    . COLONOSCOPY  2002  . ESOPHAGOGASTRODUODENOSCOPY (EGD) WITH PROPOFOL N/A 11/10/2015   Procedure: ESOPHAGOGASTRODUODENOSCOPY (EGD) WITH PROPOFOL;  Surgeon: Manya Silvas, MD;  Location: [facility] ENDOSCOPY;  Service: Endoscopy;  Laterality: N/A;  . ESOPHAGOGASTRODUODENOSCOPY (EGD) WITH PROPOFOL N/A 04/29/2016   Procedure: ESOPHAGOGASTRODUODENOSCOPY (EGD) WITH PROPOFOL;  Surgeon: Manya Silvas, MD;  Location: [facility] ENDOSCOPY;  Service: Endoscopy;   Laterality: N/A;  . FRACTURE SURGERY    . MOHS SURGERY    . PERIPHERAL VASCULAR CATHETERIZATION N/A 11/29/2015   Procedure: Glori Luis Cath Insertion;  Surgeon: Algernon Huxley, MD;  Location: [place] CV LAB;  Service: Cardiovascular;  Laterality: N/A;    Prior to Admission medications   Medication Sig Start Date End Date Taking? Authorizing Provider  amLODipine (NORVASC) 2.5 MG tablet Take 2.5 mg by mouth daily. 02/11/16  Yes [provider]  atorvastatin (LIPITOR) 20 MG tablet Take 1 tablet (20 mg total) by mouth daily at 6 PM. Patient taking differently: Take 20 mg by mouth daily with breakfast.  12/26/14  Yes Delfina Redwood, MD  Coenzyme Q10 (COQ10) 100 MG CAPS Take 100 mg by mouth daily. Reported on 11/10/2015   Yes [provider]  cyanocobalamin 500 MCG tablet Take 500 mcg by mouth daily.   Yes [provider]  dorzolamide (TRUSOPT) 2 % ophthalmic solution Place 1 drop into both eyes 2 (two) times daily.   Yes [provider]  finasteride (PROSCAR) 5 MG tablet Take 5 mg by mouth daily. 12/06/14  Yes [provider]  latanoprost (XALATAN) 0.005 % ophthalmic solution Place 1 drop into both eyes at bedtime.   Yes [provider]  metoprolol succinate (TOPROL-XL) 25 MG 24 hr tablet TAKE ONE TABLET (25 MG TOTAL) BY MOUTH DAILY. 04/19/15  Yes [provider]  timolol (TIMOPTIC) 0.5 % ophthalmic solution Place 1 drop into both eyes 2 (two) times daily. 10/27/14  Yes [provider]  Vitamin D, Ergocalciferol, (DRISDOL) 50000 UNITS CAPS Take 50,000 Units by mouth every 7 (seven) days.   Yes [provider]  apixaban (ELIQUIS) 5 MG TABS tablet Take 1 tablet (5 mg total) by mouth 2 (two) times daily. Start on 03/03/2016 03/03/16   Cristal Ford, DO  clopidogrel (PLAVIX) 75 MG tablet Take 1 tablet (75 mg total) by mouth daily. 12/26/14   Delfina Redwood, MD  dexamethasone (DECADRON) 4 MG tablet Take 8 mg by mouth as  directed. 8 mg the day after chemo for 2 days following chemotherapy 12/04/15   [provider]  lidocaine-prilocaine (EMLA) cream Apply 1 application topically as needed. Apply generously over the Mediport 45 minutes prior to chemotherapy. 11/24/15   Cammie Sickle, MD  magnesium oxide (MAG-OX) 400 MG tablet Take 400 mg by mouth daily.    [provider]  ondansetron (ZOFRAN) 8 MG tablet Take 1 tablet (8 mg total) by mouth every 8 (eight) hours as needed for nausea or vomiting (start 3 days; after chemo). 11/24/15   Cammie Sickle, MD  prochlorperazine (COMPAZINE) 10 MG tablet Take 1 tablet (10 mg total) by mouth every 6 (six) hours as needed for nausea or vomiting. 11/24/15   Cammie Sickle, MD  sucralfate (CARAFATE) 1 GM/10ML suspension Take 10 mLs (1 g total) by mouth 4 (four) times daily -  with meals and at bedtime. Patient not taking: Reported on 10/07/2016 10/02/16   Cammie Sickle, MD  terazosin (HYTRIN) 10 MG capsule Take 10 mg by mouth at bedtime. 05/01/16   [provider]    Allergies as of 10/02/2016 - Review Complete 10/02/2016  Allergen Reaction Noted  . Codeine Other (See Comments) and Anaphylaxis 12/03/2012  . Allopurinol Other (See Comments) 12/24/2014  . Simvastatin Other (See Comments) 12/24/2014  . Uloric [febuxostat] Other (See Comments) 12/24/2014    Family History  Problem Relation Age of Onset  . Heart disease Brother   . Cancer - Colon Sister   . Cancer Sister   . Other Brother     CABG    Social History   Social History  . Marital status: Married    Spouse name: N/A  . Number of children: N/A  . Years of education: N/A   Occupational History  . Not on file.   Social History Main Topics  . Smoking status: Former Smoker    Packs/day: 1.00    Years: 40.00    Types: Cigarettes    Quit date: 06/03/1993  . Smokeless tobacco: Never Used  . Alcohol use No  . Drug use: No  . Sexual activity: Not on file    Other Topics Concern  . Not on file   Social History Narrative  . No narrative on file    Review of Systems: See HPI, otherwise negative ROS  Physical Exam: There were no vitals taken for this visit. General:   Alert,  pleasant and cooperative in NAD Head:  Normocephalic and atraumatic. Neck:  Supple; no masses or thyromegaly. Lungs:  Clear throughout to auscultation.    Heart:  Regular rate and rhythm. Abdomen:  Soft, nontender and nondistended. Normal bowel sounds, without guarding, and without rebound.   Neurologic:  Alert and  oriented x4;  grossly normal neurologically.  Impression/Plan: Red Christians is here for an endoscopy to be performed for chest pain and follow up esophageal cancer.  Risks, benefits, limitations, and alternatives regarding  endoscopy have been reviewed with the patient.  Questions  have been answered.  All parties agreeable.   Gaylyn Cheers, MD  10/07/2016, 11:00 AM

## 2016-10-07 NOTE — Transfer of Care (Signed)
Immediate Anesthesia Transfer of Care Note  Patient: Marcus [name]  Procedure(s) Performed: Procedure(s): ESOPHAGOGASTRODUODENOSCOPY (EGD) WITH PROPOFOL (N/A)  Patient Location: PACU  Anesthesia Type:General  Level of Consciousness: sedated  Airway & Oxygen Therapy: Patient Spontanous Breathing and Patient connected to nasal cannula oxygen  Post-op Assessment: Report given to RN and Post -op Vital signs reviewed and stable  Post vital signs: Reviewed and stable  Last Vitals: There were no vitals filed for this visit.  Last Pain: There were no vitals filed for this visit.       Complications: No apparent anesthesia complications

## 2016-10-07 NOTE — Anesthesia Post-op Follow-up Note (Cosign Needed)
Anesthesia QCDR form completed.        

## 2016-10-08 ENCOUNTER — Encounter: Payer: Self-pay | Admitting: Unknown Physician Specialty

## 2016-10-08 NOTE — Anesthesia Postprocedure Evaluation (Signed)
Anesthesia Post Note  Patient: Marcus [name]  Procedure(s) Performed: Procedure(s) (LRB): ESOPHAGOGASTRODUODENOSCOPY (EGD) WITH PROPOFOL (N/A)  Patient location during evaluation: Endoscopy Anesthesia Type: General Level of consciousness: awake and alert Pain management: pain level controlled Vital Signs Assessment: post-procedure vital signs reviewed and stable Respiratory status: spontaneous breathing, nonlabored ventilation, respiratory function stable and patient connected to nasal cannula oxygen Cardiovascular status: blood pressure returned to baseline and stable Postop Assessment: no signs of nausea or vomiting Anesthetic complications: no     Last Vitals:  Vitals:   10/07/16 1119 10/07/16 1129  BP: 90/64 (!) 84/63  Pulse: (!) 57 (!) 56  Resp: 20 19  Temp: 36.2 C     Last Pain:  Vitals:   10/08/16 0737  TempSrc:   PainSc: 0-No pain                 Precious Haws Saajan Willmon

## 2016-10-09 LAB — SURGICAL PATHOLOGY

## 2016-10-11 ENCOUNTER — Encounter: Payer: Self-pay | Admitting: Internal Medicine

## 2016-10-14 ENCOUNTER — Inpatient Hospital Stay: Payer: Medicare Other

## 2016-10-14 ENCOUNTER — Inpatient Hospital Stay (HOSPITAL_BASED_OUTPATIENT_CLINIC_OR_DEPARTMENT_OTHER): Payer: Medicare Other | Admitting: Internal Medicine

## 2016-10-14 VITALS — BP 131/63 | HR 67 | Temp 97.6°F | Resp 20 | Ht 74.0 in | Wt 190.0 lb

## 2016-10-14 DIAGNOSIS — C155 Malignant neoplasm of lower third of esophagus: Secondary | ICD-10-CM | POA: Diagnosis not present

## 2016-10-14 DIAGNOSIS — Z87891 Personal history of nicotine dependence: Secondary | ICD-10-CM

## 2016-10-14 DIAGNOSIS — I889 Nonspecific lymphadenitis, unspecified: Secondary | ICD-10-CM | POA: Diagnosis not present

## 2016-10-14 DIAGNOSIS — Z7901 Long term (current) use of anticoagulants: Secondary | ICD-10-CM

## 2016-10-14 DIAGNOSIS — Z86711 Personal history of pulmonary embolism: Secondary | ICD-10-CM | POA: Diagnosis not present

## 2016-10-14 DIAGNOSIS — Z95828 Presence of other vascular implants and grafts: Secondary | ICD-10-CM

## 2016-10-14 MED ORDER — SODIUM CHLORIDE 0.9% FLUSH
10.0000 mL | INTRAVENOUS | Status: DC | PRN
  Administered 2016-10-14: 10 mL via INTRAVENOUS
  Filled 2016-10-14: qty 10

## 2016-10-14 MED ORDER — HEPARIN SOD (PORK) LOCK FLUSH 100 UNIT/ML IV SOLN
500.0000 [IU] | Freq: Once | INTRAVENOUS | Status: AC
  Administered 2016-10-14: 500 [IU] via INTRAVENOUS

## 2016-10-14 NOTE — Progress Notes (Signed)
[place] OFFICE PROGRESS NOTE  Patient Care Team: Chesley Noon, MD as PCP - General (Family Medicine)  Cancer Staging No matching staging information was found for the patient.   Oncology History   # June 2017- Localized [? Stage II; no EUS]Distal Esophagus Adeno CA [Bx Dr.Elliot]; PET- no distant Mets; July 5th 2017-concurrent Carbo-Taxol- RT Vespasian.Sprague ]; OCT 18th PET- improved; DEC 2017- EGD- CR. April 2018- Recurrence- Bx- pending.   # LLL nodule- monitor for now [PET June 2017]  # CKD [creat 1.4-1.7]; Hx of stroke [facility] 2016]  June 2017-MOLECULAR STUDIES- Her 2- IHC-NEG; MMR-STABLE; 09/2016- PDL-NEGATIVE.      Malignant neoplasm of lower third of esophagus (HCC)      INTERVAL HISTORY:  Marcus [name] 83 y.o.  male pleasant patient above history of Locally advanced esophageal cancer- currently s/p definitive chemoradiation is here for follow-up; patient's last radiation was in 01/16/2016;Is here to review the results of his EGD /biopsy that was recommended based on recurrence of malignancy based on PET scan April 2018.  He has been taking Carafate prior to his meals- seems to be helping him. Denies any significant difficulty swallowing or pain with swallowing. No nausea no vomiting. No tingling or numbness.  Denies any blood in stools or black stools. Patient continues on antibiotics for his gout/arthritic infection.    REVIEW OF SYSTEMS:  A complete 10 point review of system is done which is negative except mentioned above/history of present illness.   PAST MEDICAL HISTORY :  Past Medical History:  Diagnosis Date  . Aphakia of right eye   . Arthritis   . Cancer ([place])    SKIN  . Enlarged prostate without lower urinary tract symptoms (luts)   . Esophageal cancer ([place])   . GERD (gastroesophageal reflux disease)   . Glaucoma   . Gout   . History of CVA (cerebrovascular accident)    Cerebral infarction Nov. 2016; thrombosis of cerebral artery July   2016  . History of Doppler ultrasound    dopplers revealed mild left ICA stenosis which has remained stable, occluded left subclavian artery with retrograde left vertebral filling and moderately severe right subclavian artery stenosis. he has no symptoms of upper extermity claudication or subclavian steal symptoms.   . History of DVT (deep vein thrombosis)    right arm  . History of stress test 09/02/2009   abnormal myocardial perfusion scan demonstrating an attenuation defect in the inferior region of the myocardium. No ischemia or infarct/scar is seen in the remaining myocardium. No prior study available for comparison. Abnormal myocardial study EF 79%  . Hyperlipemia   . Hypertension   . Macular degeneration   . Pulmonary embolus ([place])   . Stroke ([place])   . Subclavian artery stenosis (HCC)     PAST SURGICAL HISTORY :   Past Surgical History:  Procedure Laterality Date  . CATARACT EXTRACTION    . COLONOSCOPY  2002  . ESOPHAGOGASTRODUODENOSCOPY (EGD) WITH PROPOFOL N/A 11/10/2015   Procedure: ESOPHAGOGASTRODUODENOSCOPY (EGD) WITH PROPOFOL;  Surgeon: Manya Silvas, MD;  Location: [facility] ENDOSCOPY;  Service: Endoscopy;  Laterality: N/A;  . ESOPHAGOGASTRODUODENOSCOPY (EGD) WITH PROPOFOL N/A 04/29/2016   Procedure: ESOPHAGOGASTRODUODENOSCOPY (EGD) WITH PROPOFOL;  Surgeon: Manya Silvas, MD;  Location: [facility] ENDOSCOPY;  Service: Endoscopy;  Laterality: N/A;  . ESOPHAGOGASTRODUODENOSCOPY (EGD) WITH PROPOFOL N/A 10/07/2016   Procedure: ESOPHAGOGASTRODUODENOSCOPY (EGD) WITH PROPOFOL;  Surgeon: Manya Silvas, MD;  Location: [facility] ENDOSCOPY;  Service: Endoscopy;  Laterality: N/A;  .  FRACTURE SURGERY    . MOHS SURGERY    . PERIPHERAL VASCULAR CATHETERIZATION N/A 11/29/2015   Procedure: Glori Luis Cath Insertion;  Surgeon: Algernon Huxley, MD;  Location: [place] CV LAB;  Service: Cardiovascular;  Laterality: N/A;    FAMILY HISTORY :   Family History  Problem Relation Age of Onset  . Heart disease  Brother   . Cancer - Colon Sister   . Cancer Sister   . Other Brother        CABG    SOCIAL HISTORY:   Social History  Substance Use Topics  . Smoking status: Former Smoker    Packs/day: 1.00    Years: 40.00    Types: Cigarettes    Quit date: 06/03/1993  . Smokeless tobacco: Never Used  . Alcohol use No    ALLERGIES:  is allergic to codeine; allopurinol; simvastatin; and uloric [febuxostat].  MEDICATIONS:  Current Outpatient Prescriptions  Medication Sig Dispense Refill  . amLODipine (NORVASC) 2.5 MG tablet Take 2.5 mg by mouth daily.  0  . apixaban (ELIQUIS) 5 MG TABS tablet Take 1 tablet (5 mg total) by mouth 2 (two) times daily. Start on 03/03/2016 60 tablet 0  . atorvastatin (LIPITOR) 20 MG tablet Take 1 tablet (20 mg total) by mouth daily at 6 PM. (Patient taking differently: Take 20 mg by mouth daily with breakfast. ) 30 tablet 1  . clopidogrel (PLAVIX) 75 MG tablet Take 1 tablet (75 mg total) by mouth daily. 30 tablet 1  . Coenzyme Q10 (COQ10) 100 MG CAPS Take 100 mg by mouth daily. Reported on 11/10/2015    . cyanocobalamin 500 MCG tablet Take 500 mcg by mouth daily.    . dorzolamide (TRUSOPT) 2 % ophthalmic solution Place 1 drop into both eyes 2 (two) times daily.    Marland Kitchen doxycycline (VIBRA-TABS) 100 MG tablet Take 1 tablet by mouth 2 (two) times daily.    . finasteride (PROSCAR) 5 MG tablet Take 5 mg by mouth daily.  10  . latanoprost (XALATAN) 0.005 % ophthalmic solution Place 1 drop into both eyes at bedtime.    . lidocaine-prilocaine (EMLA) cream Apply 1 application topically as needed. Apply generously over the Mediport 45 minutes prior to chemotherapy. 30 g 0  . magnesium oxide (MAG-OX) 400 MG tablet Take 400 mg by mouth daily.    . metoprolol succinate (TOPROL-XL) 25 MG 24 hr tablet TAKE ONE TABLET (25 MG TOTAL) BY MOUTH DAILY.    Marland Kitchen terazosin (HYTRIN) 10 MG capsule Take 10 mg by mouth at bedtime.  0  . timolol (TIMOPTIC) 0.5 % ophthalmic solution Place 1 drop into both  eyes 2 (two) times daily.  3  . dexamethasone (DECADRON) 4 MG tablet Take 8 mg by mouth as directed. 8 mg the day after chemo for 2 days following chemotherapy  0  . ondansetron (ZOFRAN) 8 MG tablet Take 1 tablet (8 mg total) by mouth every 8 (eight) hours as needed for nausea or vomiting (start 3 days; after chemo). (Patient not taking: Reported on 10/14/2016) 40 tablet 1  . prochlorperazine (COMPAZINE) 10 MG tablet Take 1 tablet (10 mg total) by mouth every 6 (six) hours as needed for nausea or vomiting. (Patient not taking: Reported on 10/14/2016) 40 tablet 1  . sucralfate (CARAFATE) 1 GM/10ML suspension Take 10 mLs (1 g total) by mouth 4 (four) times daily -  with meals and at bedtime. (Patient not taking: Reported on 10/07/2016) 420 mL 3  . Vitamin D, Ergocalciferol, (  DRISDOL) 50000 UNITS CAPS Take 50,000 Units by mouth every 7 (seven) days.     No current facility-administered medications for this visit.    Facility-Administered Medications Ordered in Other Visits  Medication Dose Route Frequency Provider Last Rate Last Dose  . sodium chloride flush (NS) 0.9 % injection 10 mL  10 mL Intravenous PRN Lloyd Huger, MD   10 mL at 10/14/16 1614    PHYSICAL EXAMINATION: ECOG PERFORMANCE STATUS: 2 - Symptomatic, <50% confined to bed  BP 131/63   Pulse 67   Temp 97.6 F (36.4 C) (Tympanic)   Resp 20   Ht 6' 2"  (1.88 m)   Wt 190 lb (86.2 kg)   BMI 24.39 kg/m   Filed Weights   10/14/16 1526  Weight: 190 lb (86.2 kg)    GENERAL: Well-nourished well-developed; Alert, no distress and comfortable.  Accompanied by his wife/ daughtr He walks with a cane. EYES: no pallor or icterus OROPHARYNX: no thrush or ulceration;   NECK: supple, no masses felt LYMPH:  no palpable lymphadenopathy in the cervical  or inguinal regions. Right axilla ~1cm LN- non-tender LUNGS: clear to auscultation and  No wheeze or crackles.  HEART/CVS: regular rate & rhythm and no murmurs; 1 plus bil lower extremity  edema ABDOMEN:abdomen soft, non-tender and normal bowel sounds Musculoskeletal:no cyanosis of digits and no clubbing; swelling of the index and middle finger on the right side- "gout"; mild warmth mild tenderness/erythema [improving] PSYCH: alert & oriented x 3 with fluent speech NEURO: no focal motor/sensory deficits SKIN:  no rashes;  LABORATORY DATA:  I have reviewed the data as listed    Component Value Date/Time   NA 137 09/06/2016 1100   K 4.3 09/06/2016 1100   CL 111 09/06/2016 1100   CO2 20 (L) 09/06/2016 1100   GLUCOSE 117 (H) 09/06/2016 1100   BUN 29 (H) 09/06/2016 1100   CREATININE 1.54 (H) 09/06/2016 1100   CALCIUM 8.6 (L) 09/06/2016 1100   PROT 7.2 09/06/2016 1100   ALBUMIN 3.4 (L) 09/06/2016 1100   AST 17 09/06/2016 1100   ALT 8 (L) 09/06/2016 1100   ALKPHOS 111 09/06/2016 1100   BILITOT 0.3 09/06/2016 1100   GFRNONAA 40 (L) 09/06/2016 1100   GFRAA 46 (L) 09/06/2016 1100    No results found for: SPEP, UPEP  Lab Results  Component Value Date   WBC 4.0 09/06/2016   NEUTROABS 2.9 09/06/2016   HGB 9.9 (L) 09/06/2016   HCT 28.6 (L) 09/06/2016   MCV 95.8 09/06/2016   PLT 164 09/06/2016      Chemistry      Component Value Date/Time   NA 137 09/06/2016 1100   K 4.3 09/06/2016 1100   CL 111 09/06/2016 1100   CO2 20 (L) 09/06/2016 1100   BUN 29 (H) 09/06/2016 1100   CREATININE 1.54 (H) 09/06/2016 1100      Component Value Date/Time   CALCIUM 8.6 (L) 09/06/2016 1100   ALKPHOS 111 09/06/2016 1100   AST 17 09/06/2016 1100   ALT 8 (L) 09/06/2016 1100   BILITOT 0.3 09/06/2016 1100     IMPRESSION: 1. Recurrent focal hypermetabolic activity in the distal esophagus highly suspicious for recurrent malignancy, maximum SUV 11.0. 2. Interval parenchymal thickening and high metabolic activity in the right axillary lymph node, maximum SUV 5.6, possibly metastatic although this is an unusual drainage pathway. 3. There is a branching nodular density in the left  lower lobe which is stable to minimally increased in size  from 04/20/2016, but significantly increased in size compared to 2007. Maximum SUV is only 1.6. Given the slow progression in size over the last 11 years, this could represent a very low-grade malignancy or a progressive inflammatory process. 4. Other imaging findings of potential clinical significance: Coronary, aortic arch, and branch vessel atherosclerotic vascular disease. Cardiomegaly. Sigmoid colon diverticulosis. Aortoiliac atherosclerotic vascular disease. Trace right pleural effusion.   Electronically Signed   By: Van Clines M.D.   On: 09/04/2016 12:33  RADIOGRAPHIC STUDIES: I have personally reviewed the radiological images as listed and agreed with the findings in the report. No results found.   ASSESSMENT & PLAN:  Malignant neoplasm of lower third of esophagus (HCC) Localized esophageal cancer- Patient s/p chemotherapy -RT [RT finished Aug 15th 2017 ].  April 2018 PET scan shows-suspicious local recurrence in the lower esophagus; and also a right axillary lymph node [ unusual for lymphatic drainage]; April 2018- Bx of LN- Negative for malignancy [positive for lymphadenitis]. However endoscopy- biopsy negative for malignancy. Discussed with Dr. Vira Agar; and also discuss with Dr. Reuel Derby in pathology.  # Discussed my concern for continued suspicion for recurrent malignancy even though biopsies [as discussed above] are negative. Discussed regarding getting endoscopic ultrasound versus waiting for about 3 months to repeat the PET scan [as patient is not a surgical candidate; and currently asymptomatic].   # After lengthy discussion patient/family not decided; they'll call us and let us know if they want to proceed with endoscopic ultrasound sooner than later.   #  PE- Oletta Darter 2017]- currently on Eliquis. Discussed that the length of anticoagulation would be anywhere between 6 months to one year. Given the  malignancy as a potential cause of his blood clot- I would recommend continued anticoagulation at least for one year.    # Intermittent nosebleeds-patient also on Plavix; along with Eliquis- discussed that This is increases potential for bleeding. If this continues to a problem I would recommend discontinuation of Eliquis.    # follow up in 3 months/ PET prior; labs.   cc; Dr.Badger, [facility] [place].   Orders Placed This Encounter  Procedures  . NM PET Image Restag (PS) Skull Base To Thigh    Standing Status:   Future    Standing Expiration Date:   12/14/2017    Order Specific Question:   Reason for Exam (SYMPTOM  OR DIAGNOSIS REQUIRED)    Answer:   EGD    Order Specific Question:   Preferred imaging location?    Answer:   [facility]   All questions were answered. The patient knows to call the clinic with any problems, questions or concerns.      Cammie Sickle, MD 10/14/2016 4:16 PM

## 2016-10-14 NOTE — Assessment & Plan Note (Addendum)
Localized esophageal cancer- Patient s/p chemotherapy -RT [RT finished Aug 15th 2017 ].  April 2018 PET scan shows-suspicious local recurrence in the lower esophagus; and also a right axillary lymph node [ unusual for lymphatic drainage]; April 2018- Bx of LN- Negative for malignancy [positive for lymphadenitis]. However endoscopy- biopsy negative for malignancy. Discussed with Dr. Vira Agar; and also discuss with Dr. Reuel Derby in pathology.  # Discussed my concern for continued suspicion for recurrent malignancy even though biopsies [as discussed above] are negative. Discussed regarding getting endoscopic ultrasound versus waiting for about 3 months to repeat the PET scan [as patient is not a surgical candidate; and currently asymptomatic].   # After lengthy discussion patient/family not decided; they'll call us and let us know if they want to proceed with endoscopic ultrasound sooner than later.   #  PE- Marcus [name] 2017]- currently on Eliquis. Discussed that the length of anticoagulation would be anywhere between 6 months to one year. Given the malignancy as a potential cause of his blood clot- I would recommend continued anticoagulation at least for one year.    # Intermittent nosebleeds-patient also on Plavix; along with Eliquis- discussed that This is increases potential for bleeding. If this continues to a problem I would recommend discontinuation of Eliquis.    # follow up in 3 months/ PET prior; labs.   cc; Dr.Badger, Legrand Como.

## 2016-10-15 ENCOUNTER — Telehealth: Payer: Self-pay | Admitting: *Deleted

## 2016-10-15 NOTE — Telephone Encounter (Signed)
Left pt vm notifying pt that someone by the name of "Coralyn Mark" contacted our office to discuss pt's care, we do not have a DPR allowing [place] to discuss care with anyone named Coralyn Mark.

## 2016-10-15 NOTE — Telephone Encounter (Signed)
Pt returned my call, gave verbal permission to speak with step-daughter terry. Call terry, terry was confirming a question they had about second opinion at [facility].  Coralyn Mark confirmed that they would like to stick with the plan of following up with Dr Rogue Bussing in 3 mths as planned.

## 2016-10-15 NOTE — Telephone Encounter (Signed)
She called stating patient has questions regarding discussion yesterday and would like a call back. She is NOT on his contact information, but I do not see a release of information form scanned in to his chart

## 2016-11-05 ENCOUNTER — Encounter: Payer: Self-pay | Admitting: *Deleted

## 2016-11-05 ENCOUNTER — Telehealth: Payer: Self-pay | Admitting: *Deleted

## 2016-11-05 ENCOUNTER — Other Ambulatory Visit: Payer: Self-pay | Admitting: *Deleted

## 2016-11-05 NOTE — Progress Notes (Unsigned)
   Pt unsure - pain when he swallows   sucra 1 g tablet - 4 time a day

## 2016-11-05 NOTE — Telephone Encounter (Signed)
Per v/o Dr. Mike Gip - contacted pt to discuss his symptoms of pain. Pt states that his pain is worse after eating. Pt states that his insurance would not approve lqd carafate. Pt states that he has Carafate 1 gm tablets. Pt states that he has been "swallowing the tablets whole and only using the carafate 1 time a day. Sometimes the tablet gets stuck in my throat and I can not use this before the meals like I'm supposed to."  RN reviewed instructions for pt on Carafate. Pt instructed to place the tablet in a medicine cup and fill up the medicine cup with water. Pt instructed to allow the tablet to dissolve in water for approximately 15 mins. Pt instructed to take the carafate before meals and at bedtime (up to 4 times a day).  Pt instructed to use omeprazole 20 mg daily twice a day before meals. I explained to pt and his wife not to take the carafate and prilosec at the same time (approximately 30 mins to an hour apart).  Pt's [place] He will try these interventions and call our office back if he should have any questions or concerns. Teach back process performed. Wife read back the instructions as well as the patient.

## 2016-11-25 ENCOUNTER — Inpatient Hospital Stay: Payer: Medicare Other | Attending: Internal Medicine

## 2016-12-03 ENCOUNTER — Telehealth: Payer: Self-pay | Admitting: *Deleted

## 2016-12-03 DIAGNOSIS — C155 Malignant neoplasm of lower third of esophagus: Secondary | ICD-10-CM

## 2016-12-03 DIAGNOSIS — R1310 Dysphagia, unspecified: Secondary | ICD-10-CM

## 2016-12-03 DIAGNOSIS — K2100 Gastro-esophageal reflux disease with esophagitis, without bleeding: Secondary | ICD-10-CM

## 2016-12-03 NOTE — Telephone Encounter (Signed)
-----   Message from [place] sent at 12/03/2016  9:49 AM EDT ----- Got a message on VM pt needs appt stating cant keep anything down for about 4 wks losing weight.

## 2016-12-03 NOTE — Telephone Encounter (Signed)
Contacted patient. Spoke with patient's wife.  Wife reports patient has experienced 11 lb wt loss in 5 to 6 weeks. Pt not eating. Easily gets choked with solids. Can swallow Liquids w/o difficulty. Pt taking omeprazole twice daily. I encouraged pt to take the omeprazole 1 hr before his lunch/dinner. Pt was previously unable to afford lqd Carafate.   Pt swallowing the Carafate tablet whole. Not allowing the tablet to be dissolved in water. I encouraged pt's wife to allow the tablet to dissolve in water for 15 to 20 mins in a medicine cup before med is dispensed to patient.  wife was instructed to provide the carafate approx. 2 hrs after a meal. Pt instructed not to take the omeprazole and Carafate at the same time. She will try this and see if improves symptoms mgmt. Pt taking zofran for nausea and does not have any compazine at home. Zofran often relieves nausea at times;however, pt is not consistent with use of zofran. Pt's wife instructed that pt can take the zofran every 8 hrs if needed for nausea. Also discused the md would like to order barium swallowing study to determine if stricture is present vs cancer reoccurrence. Wife gave verbal understanding of the plan of care.   make an apt for patient with Dr. Jacinto Reap for 7/12 - 815 am.  Wife request to move port flush date on 7/13 to 7/12. This can be scheduled after to after md apt. md also ordered barium swallow study to be scheduled asap--msg sent to sch. Team to  please contact wife with swallowing study apts and to schedule the above apts.

## 2016-12-06 ENCOUNTER — Ambulatory Visit
Admission: RE | Admit: 2016-12-06 | Discharge: 2016-12-06 | Disposition: A | Discharge status: Home / Self Care | Payer: Medicare Other | Source: Ambulatory Visit | Attending: Internal Medicine | Admitting: Internal Medicine

## 2016-12-06 ENCOUNTER — Inpatient Hospital Stay: Payer: Medicare Other | Attending: Internal Medicine | Admitting: Internal Medicine

## 2016-12-06 ENCOUNTER — Telehealth: Payer: Self-pay | Admitting: *Deleted

## 2016-12-06 ENCOUNTER — Inpatient Hospital Stay: Payer: Medicare Other

## 2016-12-06 VITALS — BP 137/65 | HR 62 | Temp 96.9°F | Wt 174.2 lb

## 2016-12-06 DIAGNOSIS — C155 Malignant neoplasm of lower third of esophagus: Secondary | ICD-10-CM

## 2016-12-06 DIAGNOSIS — Z8673 Personal history of transient ischemic attack (TIA), and cerebral infarction without residual deficits: Secondary | ICD-10-CM | POA: Insufficient documentation

## 2016-12-06 DIAGNOSIS — K2100 Gastro-esophageal reflux disease with esophagitis, without bleeding: Secondary | ICD-10-CM

## 2016-12-06 DIAGNOSIS — N189 Chronic kidney disease, unspecified: Secondary | ICD-10-CM | POA: Diagnosis not present

## 2016-12-06 DIAGNOSIS — Z923 Personal history of irradiation: Secondary | ICD-10-CM | POA: Insufficient documentation

## 2016-12-06 DIAGNOSIS — Z79899 Other long term (current) drug therapy: Secondary | ICD-10-CM | POA: Insufficient documentation

## 2016-12-06 DIAGNOSIS — I129 Hypertensive chronic kidney disease with stage 1 through stage 4 chronic kidney disease, or unspecified chronic kidney disease: Secondary | ICD-10-CM | POA: Insufficient documentation

## 2016-12-06 DIAGNOSIS — Z86711 Personal history of pulmonary embolism: Secondary | ICD-10-CM | POA: Diagnosis not present

## 2016-12-06 DIAGNOSIS — R509 Fever, unspecified: Secondary | ICD-10-CM | POA: Diagnosis not present

## 2016-12-06 DIAGNOSIS — E785 Hyperlipidemia, unspecified: Secondary | ICD-10-CM | POA: Insufficient documentation

## 2016-12-06 DIAGNOSIS — K5730 Diverticulosis of large intestine without perforation or abscess without bleeding: Secondary | ICD-10-CM | POA: Diagnosis not present

## 2016-12-06 DIAGNOSIS — Z86718 Personal history of other venous thrombosis and embolism: Secondary | ICD-10-CM | POA: Diagnosis not present

## 2016-12-06 DIAGNOSIS — K219 Gastro-esophageal reflux disease without esophagitis: Secondary | ICD-10-CM | POA: Diagnosis not present

## 2016-12-06 DIAGNOSIS — Z9221 Personal history of antineoplastic chemotherapy: Secondary | ICD-10-CM | POA: Insufficient documentation

## 2016-12-06 DIAGNOSIS — N400 Enlarged prostate without lower urinary tract symptoms: Secondary | ICD-10-CM | POA: Diagnosis not present

## 2016-12-06 DIAGNOSIS — H409 Unspecified glaucoma: Secondary | ICD-10-CM | POA: Insufficient documentation

## 2016-12-06 DIAGNOSIS — M109 Gout, unspecified: Secondary | ICD-10-CM | POA: Diagnosis not present

## 2016-12-06 DIAGNOSIS — I6522 Occlusion and stenosis of left carotid artery: Secondary | ICD-10-CM | POA: Insufficient documentation

## 2016-12-06 DIAGNOSIS — H3530 Unspecified macular degeneration: Secondary | ICD-10-CM | POA: Diagnosis not present

## 2016-12-06 DIAGNOSIS — Z87891 Personal history of nicotine dependence: Secondary | ICD-10-CM | POA: Insufficient documentation

## 2016-12-06 DIAGNOSIS — K225 Diverticulum of esophagus, acquired: Secondary | ICD-10-CM | POA: Insufficient documentation

## 2016-12-06 DIAGNOSIS — R05 Cough: Secondary | ICD-10-CM | POA: Diagnosis not present

## 2016-12-06 DIAGNOSIS — C7951 Secondary malignant neoplasm of bone: Secondary | ICD-10-CM | POA: Diagnosis not present

## 2016-12-06 LAB — COMPREHENSIVE METABOLIC PANEL
ALBUMIN: 3.4 g/dL — AB (ref 3.5–5.0)
ALT: 7 U/L — ABNORMAL LOW (ref 17–63)
AST: 17 U/L (ref 15–41)
Alkaline Phosphatase: 115 U/L (ref 38–126)
Anion gap: 9 (ref 5–15)
BILIRUBIN TOTAL: 0.6 mg/dL (ref 0.3–1.2)
BUN: 42 mg/dL — AB (ref 6–20)
CO2: 23 mmol/L (ref 22–32)
CREATININE: 1.73 mg/dL — AB (ref 0.61–1.24)
Calcium: 9.1 mg/dL (ref 8.9–10.3)
Chloride: 107 mmol/L (ref 101–111)
GFR, EST AFRICAN AMERICAN: 40 mL/min — AB (ref >60)
GFR, EST NON AFRICAN AMERICAN: 35 mL/min — AB (ref >60)
Glucose, Bld: 125 mg/dL — ABNORMAL HIGH (ref 65–99)
POTASSIUM: 4.4 mmol/L (ref 3.5–5.1)
SODIUM: 139 mmol/L (ref 135–145)
TOTAL PROTEIN: 7.9 g/dL (ref 6.5–8.1)

## 2016-12-06 LAB — CBC WITH DIFFERENTIAL/PLATELET
BASOS PCT: 0 %
Basophils Absolute: 0 10*3/uL (ref 0–0.1)
EOS ABS: 0.1 10*3/uL (ref 0–0.7)
Eosinophils Relative: 1 %
HCT: 29.4 % — ABNORMAL LOW (ref 40.0–52.0)
HEMOGLOBIN: 9.9 g/dL — AB (ref 13.0–18.0)
Lymphocytes Relative: 9 %
Lymphs Abs: 0.7 10*3/uL — ABNORMAL LOW (ref 1.0–3.6)
MCH: 31.7 pg (ref 26.0–34.0)
MCHC: 33.7 g/dL (ref 32.0–36.0)
MCV: 94.3 fL (ref 80.0–100.0)
Monocytes Absolute: 0.5 10*3/uL (ref 0.2–1.0)
Monocytes Relative: 7 %
NEUTROS PCT: 83 %
Neutro Abs: 6 10*3/uL (ref 1.4–6.5)
Platelets: 264 10*3/uL (ref 150–440)
RBC: 3.12 MIL/uL — AB (ref 4.40–5.90)
RDW: 14.7 % — ABNORMAL HIGH (ref 11.5–14.5)
WBC: 7.3 10*3/uL (ref 3.8–10.6)

## 2016-12-06 NOTE — Telephone Encounter (Signed)
Wife called stating they are here from [place] and want to know how long before he can get results.  Printed results and discussed with Dr B who wants to see patient at 10:15 this morning. Appt accepted by wife

## 2016-12-06 NOTE — Progress Notes (Signed)
Patient here today for results.  

## 2016-12-06 NOTE — Patient Instructions (Signed)
#   STOP PLAVIX starting today; HOLD eliquis 3 days prior to stomach tube placement.   # call Dr.Elliot's office that you agreed to PEG tube.

## 2016-12-06 NOTE — Progress Notes (Signed)
[facility] OFFICE PROGRESS NOTE  Patient Care Team: Chesley Noon, MD as PCP - General (Family Medicine)  Cancer Staging No matching staging information was found for the patient.   Oncology History   # June 2017- Localized [? Stage II; no EUS]Distal Esophagus Adeno CA [Bx Dr.Elliot]; PET- no distant Mets; July 5th 2017-concurrent Carbo-Taxol- RT [facility]ague ]; OCT 18th PET- improved; DEC 2017- EGD- CR. April 2018-PET Recurrence- Bx- NEG [Dr.Elliot]  # LLL nodule- monitor for now [PET June 2017]  # CKD [creat 1.4-1.7]; Hx of stroke [facility] 2016]  June 2017-MOLECULAR STUDIES- Her 2- IHC-NEG; MMR-STABLE; 09/2016- PDL-NEGATIVE.      Malignant neoplasm of lower third of esophagus (HCC)      INTERVAL HISTORY:  Marcus [name] 83 y.o.  male pleasant patient above history of Locally advanced esophageal cancer- currently s/p definitive chemoradiation is here for follow-up; patient's last radiation was in 01/16/2016;Is here to review the results of his Barium esophagogram that was recommended based on symptoms of worsening dysphagia/odynophagia.  In the last 2-3 weeks patient has noticed significant difficulty swallowing; most solids and also intermittently liquids. Is currently mostly on liquid diet.  He has been taking Carafate prior to his meals- seems to be helping him. Denies any significant difficulty swallowing or pain with swallowing. No nausea no vomiting. No tingling or numbness.  Denies any blood in stools or black stools. Patient continues on antibiotics for his gout/arthritic infection.    REVIEW OF SYSTEMS:  A complete 10 point review of system is done which is negative except mentioned above/history of present illness.   PAST MEDICAL HISTORY :  Past Medical History:  Diagnosis Date  . Aphakia of right eye   . Arthritis   . Cancer ([place])    SKIN  . Enlarged prostate without lower urinary tract symptoms (luts)   . Esophageal cancer ([place])   . GERD  (gastroesophageal reflux disease)   . Glaucoma   . Gout   . History of CVA (cerebrovascular accident)    Cerebral infarction Nov. 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 History of Doppler ultrasound    dopplers revealed mild left ICA stenosis which has remained stable, occluded left subclavian artery with retrograde left vertebral filling and moderately severe right subclavian artery stenosis. he has no symptoms of upper extermity claudication or subclavian steal symptoms.   . History of DVT (deep vein thrombosis)    right arm  . History of stress test 09/02/2009   abnormal myocardial perfusion scan demonstrating an attenuation defect in the inferior region of the myocardium. No ischemia or infarct/scar is seen in the remaining myocardium. No prior study available for comparison. Abnormal myocardial study EF 79%  . Hyperlipemia   . Hypertension   . Macular degeneration   . Pulmonary embolus ([place])   . Stroke ([place])   . Subclavian artery stenosis (HCC)     PAST SURGICAL HISTORY :   Past Surgical History:  Procedure Laterality Date  . CATARACT EXTRACTION    . COLONOSCOPY  2002  . ESOPHAGOGASTRODUODENOSCOPY (EGD) WITH PROPOFOL N/A 11/10/2015   Procedure: ESOPHAGOGASTRODUODENOSCOPY (EGD) WITH PROPOFOL;  Surgeon: Manya Silvas, MD;  Location: [facility] ENDOSCOPY;  Service: Endoscopy;  Laterality: N/A;  . ESOPHAGOGASTRODUODENOSCOPY (EGD) WITH PROPOFOL N/A 04/29/2016   Procedure: ESOPHAGOGASTRODUODENOSCOPY (EGD) WITH PROPOFOL;  Surgeon: Manya Silvas, MD;  Location: [facility] ENDOSCOPY;  Service: Endoscopy;  Laterality: N/A;  . ESOPHAGOGASTRODUODENOSCOPY (EGD) WITH PROPOFOL N/A 10/07/2016   Procedure: ESOPHAGOGASTRODUODENOSCOPY (  EGD) WITH PROPOFOL;  Surgeon: Manya Silvas, MD;  Location: [facility] ENDOSCOPY;  Service: Endoscopy;  Laterality: N/A;  . FRACTURE SURGERY    . MOHS SURGERY    . PERIPHERAL VASCULAR CATHETERIZATION N/A 11/29/2015   Procedure: Glori Luis Cath Insertion;  Surgeon: Algernon Huxley, MD;  Location: [facility] CV LAB;  Service: Cardiovascular;  Laterality: N/A;    FAMILY HISTORY :   Family History  Problem Relation Age of Onset  . Heart disease Brother   . Cancer - Colon Sister   . Cancer Sister   . Other Brother        CABG    SOCIAL HISTORY:   Social History  Substance Use Topics  . Smoking status: Former Smoker    Packs/day: 1.00    Years: 40.00    Types: Cigarettes    Quit date: 06/03/1993  . Smokeless tobacco: Never Used  . Alcohol use No    ALLERGIES:  is allergic to codeine; allopurinol; simvastatin; and uloric [febuxostat].  MEDICATIONS:  Current Outpatient Prescriptions  Medication Sig Dispense Refill  . acetaminophen (TYLENOL) 500 MG tablet Take 500 mg by mouth every 6 (six) hours as needed.    Marland Kitchen amLODipine (NORVASC) 2.5 MG tablet Take 2.5 mg by mouth daily.  0  . apixaban (ELIQUIS) 5 MG TABS tablet Take 1 tablet (5 mg total) by mouth 2 (two) times daily. Start on 03/03/2016 60 tablet 0  . atorvastatin (LIPITOR) 20 MG tablet Take 1 tablet (20 mg total) by mouth daily at 6 PM. (Patient taking differently: Take 20 mg by mouth daily with breakfast. ) 30 tablet 1  . calcium carbonate (TUMS EX) 750 MG chewable tablet Chew 1 tablet by mouth daily.    . clopidogrel (PLAVIX) 75 MG tablet Take 1 tablet (75 mg total) by mouth daily. 30 tablet 1  . Coenzyme Q10 (COQ10) 100 MG CAPS Take 100 mg by mouth daily. Reported on 11/10/2015    . cyanocobalamin 500 MCG tablet Take 500 mcg by mouth daily.    . dorzolamide (TRUSOPT) 2 % ophthalmic solution Place 1 drop into both eyes 2 (two) times daily.    . finasteride (PROSCAR) 5 MG tablet Take 5 mg by mouth daily.  10  . latanoprost (XALATAN) 0.005 % ophthalmic solution Place 1 drop into both eyes at bedtime.    . lidocaine-prilocaine (EMLA) cream Apply 1 application topically as needed. Apply generously over the Mediport 45 minutes prior to chemotherapy. 30 g 0  . magnesium oxide (MAG-OX) 400 MG tablet Take 400  mg by mouth daily.    . metoprolol succinate (TOPROL-XL) 25 MG 24 hr tablet TAKE ONE TABLET (25 MG TOTAL) BY MOUTH DAILY.    Marland Kitchen omeprazole (PRILOSEC) 20 MG capsule Take 20 mg by mouth 2 (two) times daily before a meal.    . sucralfate (CARAFATE) 1 g tablet Take 1 g by mouth 4 (four) times daily.    Marland Kitchen terazosin (HYTRIN) 10 MG capsule Take 10 mg by mouth at bedtime.  0  . timolol (TIMOPTIC) 0.5 % ophthalmic solution Place 1 drop into both eyes 2 (two) times daily.  3  . Vitamin D, Ergocalciferol, (DRISDOL) 50000 UNITS CAPS Take 50,000 Units by mouth every 7 (seven) days.    Marland Kitchen dexamethasone (DECADRON) 4 MG tablet Take 8 mg by mouth as directed. 8 mg the day after chemo for 2 days following chemotherapy  0  . ondansetron (ZOFRAN) 8 MG tablet Take 1 tablet (8 mg total)  by mouth every 8 (eight) hours as needed for nausea or vomiting (start 3 days; after chemo). (Patient not taking: Reported on 12/06/2016) 40 tablet 1  . prochlorperazine (COMPAZINE) 10 MG tablet Take 1 tablet (10 mg total) by mouth every 6 (six) hours as needed for nausea or vomiting. (Patient not taking: Reported on 10/14/2016) 40 tablet 1   No current facility-administered medications for this visit.     PHYSICAL EXAMINATION: ECOG PERFORMANCE STATUS: 2 - Symptomatic, <50% confined to bed  BP 137/65 (BP Location: Right Arm, Patient Position: Sitting)   Pulse 62   Temp (!) 96.9 F (36.1 C) (Tympanic)   Wt 174 lb 4 oz (79 kg)   BMI 22.37 kg/m   Filed Weights   12/06/16 1028  Weight: 174 lb 4 oz (79 kg)    GENERAL: Well-nourished well-developed; Alert, no distress and comfortable.  Accompanied by his wife/ daughtr He walks with a cane. EYES: no pallor or icterus OROPHARYNX: no thrush or ulceration;   NECK: supple, no masses felt LYMPH:  no palpable lymphadenopathy in the cervical  or inguinal regions. Right axilla ~1cm LN- non-tender LUNGS: clear to auscultation and  No wheeze or crackles.  HEART/CVS: regular rate & rhythm and  no murmurs; 1 plus bil lower extremity edema ABDOMEN:abdomen soft, non-tender and normal bowel sounds Musculoskeletal:no cyanosis of digits and no clubbing; swelling of the index and middle finger on the right side- "gout"; mild warmth mild tenderness/erythema [improving] PSYCH: alert & oriented x 3 with fluent speech NEURO: no focal motor/sensory deficits SKIN:  no rashes;  LABORATORY DATA:  I have reviewed the data as listed    Component Value Date/Time   NA 139 12/06/2016 1132   K 4.4 12/06/2016 1132   CL 107 12/06/2016 1132   CO2 23 12/06/2016 1132   GLUCOSE 125 (H) 12/06/2016 1132   BUN 42 (H) 12/06/2016 1132   CREATININE 1.73 (H) 12/06/2016 1132   CALCIUM 9.1 12/06/2016 1132   PROT 7.9 12/06/2016 1132   ALBUMIN 3.4 (L) 12/06/2016 1132   AST 17 12/06/2016 1132   ALT 7 (L) 12/06/2016 1132   ALKPHOS 115 12/06/2016 1132   BILITOT 0.6 12/06/2016 1132   GFRNONAA 35 (L) 12/06/2016 1132   GFRAA 40 (L) 12/06/2016 1132    No results found for: SPEP, UPEP  Lab Results  Component Value Date   WBC 7.3 12/06/2016   NEUTROABS 6.0 12/06/2016   HGB 9.9 (L) 12/06/2016   HCT 29.4 (L) 12/06/2016   MCV 94.3 12/06/2016   PLT 264 12/06/2016      Chemistry      Component Value Date/Time   NA 139 12/06/2016 1132   K 4.4 12/06/2016 1132   CL 107 12/06/2016 1132   CO2 23 12/06/2016 1132   BUN 42 (H) 12/06/2016 1132   CREATININE 1.73 (H) 12/06/2016 1132      Component Value Date/Time   CALCIUM 9.1 12/06/2016 1132   ALKPHOS 115 12/06/2016 1132   AST 17 12/06/2016 1132   ALT 7 (L) 12/06/2016 1132   BILITOT 0.6 12/06/2016 1132     IMPRESSION: 1. Recurrent focal hypermetabolic activity in the distal esophagus highly suspicious for recurrent malignancy, maximum SUV 11.0. 2. Interval parenchymal thickening and high metabolic activity in the right axillary lymph node, maximum SUV 5.6, possibly metastatic although this is an unusual drainage pathway. 3. There is a branching nodular  density in the left lower lobe which is stable to minimally increased in size from 04/20/2016, but  significantly increased in size compared to 2007. Maximum SUV is only 1.6. Given the slow progression in size over the last 11 years, this could represent a very low-grade malignancy or a progressive inflammatory process. 4. Other imaging findings of potential clinical significance: Coronary, aortic arch, and branch vessel atherosclerotic vascular disease. Cardiomegaly. Sigmoid colon diverticulosis. Aortoiliac atherosclerotic vascular disease. Trace right pleural effusion.   Electronically Signed   By: Van Clines M.D.   On: 09/04/2016 12:33  RADIOGRAPHIC STUDIES: I have personally reviewed the radiological images as listed and agreed with the findings in the report. Dg Esophagus  Result Date: 12/06/2016 CLINICAL DATA:  ESOPHAGEAL CANCER. EXAM: ESOPHOGRAM/BARIUM SWALLOW TECHNIQUE: Single contrast examination was performed using  THIN BARIUM. FLUOROSCOPY TIME:  Fluoroscopy Time:  0 minutes 42 seconds Radiation Exposure Index (if provided by the fluoroscopic device): 12.3 mGy Number of Acquired Spot Images: 15 COMPARISON:  PET-CT 09/04/2016. FINDINGS: Small Zenker's diverticulum. High-grade long stricture of the distal esophagus just above the gastroesophageal junction is noted. This is consistent with the patient's esophageal cancer and is in the region of the abnormal activity on PET-CT. PowerPort catheter noted. IMPRESSION: High-grade near occlusive long stricture of the distal esophagus just above the gastroesophageal junction consistent with the patient's known esophageal malignancy. Electronically Signed   By: Marcello Moores  Register   On: 12/06/2016 09:03    IMPRESSION: High-grade near occlusive long stricture of the distal esophagus just above the gastroesophageal junction consistent with the patient's known esophageal malignancy.   Electronically Signed   By: Marcello Moores  Register    On: 12/06/2016 09:03  ASSESSMENT & PLAN:  Malignant neoplasm of lower third of esophagus ([place]) Localized esophageal cancer- Patient s/p chemotherapy -RT [RT finished Aug 15th 2017 ].  April 2018 PET scan shows-suspicious local recurrence in the lower esophagus; and also a right axillary lymph node [ unusual for lymphatic drainage]; April 2018- Bx of LN- Negative for malignancy [positive for lymphadenitis]. May 2018- However endoscopy- biopsy negative for malignancy.   # However, today Clinically highly suspicious for recurrent malignancy/esophagus- based on barium esophagogram [occlusive stricture noted at the lower end of the esophagus]; symptoms; and previous imaging PET in April 2018. Will get the PET scan sooner- patient will benefit from systemic chemotherapy- once PEG tube is placed.  # Odynophagia/ Poor po intake/ discussed re: feeding tube. Pt agrees.   #  PE- Oletta Darter 2017]- currently on Eliquis; will plan hold eliquis 3 days prior to procedure.   # Intermittent nosebleeds-patient also on Plavix; along with Eliquis. Will hold plavix starting today.    # please re-schedule the PET scan sooner; follow up few days later. Okay with second opinion.   # follow up TBD based on the PEG tube placement.   # I reviewed the blood work- with the patient in detail; also reviewed the imaging independently [as summarized above]; and with the patient in detail.  Discussed with Dr.Elliot.   # 40 minutes face-to-face with the patient discussing the above plan of care; more than 50% of time spent on prognosis/ natural history; counseling and coordination.  Addendum: Discussed with Dr. Vira Agar again; plan PEG tube placement on the 11th; admission the hospital on the 10th/Tuesday hospitalist service.   Orders Placed This Encounter  Procedures  . CBC with Differential/Platelet    Standing Status:   Future    Number of Occurrences:   1    Standing Expiration Date:   12/06/2017  . CEA    Standing  Status:   Future  Number of Occurrences:   1    Standing Expiration Date:   12/06/2017  . Comprehensive metabolic panel    Standing Status:   Future    Number of Occurrences:   1    Standing Expiration Date:   12/06/2017   All questions were answered. The patient knows to call the clinic with any problems, questions or concerns.      Cammie Sickle, MD 12/06/2016 1:20 PM

## 2016-12-06 NOTE — Assessment & Plan Note (Addendum)
Localized esophageal cancer- Patient s/p chemotherapy -RT [RT finished Aug 15th 2017 ].  April 2018 PET scan shows-suspicious local recurrence in the lower esophagus; and also a right axillary lymph node [ unusual for lymphatic drainage]; April 2018- Bx of LN- Negative for malignancy [positive for lymphadenitis]. May 2018- However endoscopy- biopsy negative for malignancy.   # However, today Clinically highly suspicious for recurrent malignancy/esophagus- based on barium esophagogram [occlusive stricture noted at the lower end of the esophagus]; symptoms; and previous imaging PET in April 2018. Will get the PET scan sooner- patient will benefit from systemic chemotherapy- once PEG tube is placed.  # Odynophagia/ Poor po intake/ discussed re: feeding tube. Pt agrees.   #  PE- Oletta Darter 2017]- currently on Eliquis; will plan hold eliquis 3 days prior to procedure.   # Intermittent nosebleeds-patient also on Plavix; along with Eliquis. Will hold plavix starting today.    # please re-schedule the PET scan sooner; follow up few days later. Okay with second opinion.   # follow up TBD based on the PEG tube placement.   # I reviewed the blood work- with the patient in detail; also reviewed the imaging independently [as summarized above]; and with the patient in detail.  Discussed with Dr.Elliot.   # 40 minutes face-to-face with the patient discussing the above plan of care; more than 50% of time spent on prognosis/ natural history; counseling and coordination.

## 2016-12-07 LAB — CEA: CEA: 25.6 ng/mL — ABNORMAL HIGH (ref 0.0–4.7)

## 2016-12-09 ENCOUNTER — Telehealth: Payer: Self-pay | Admitting: Internal Medicine

## 2016-12-09 NOTE — Telephone Encounter (Signed)
RN Spoke with Dr. Vira Agar personally.  Plan is on schedule to place the PEG tube on Wednesday. He stated that pt will be admitted by hospitalist the day before.

## 2016-12-09 NOTE — Telephone Encounter (Signed)
Please confirm with Dr.Elliot's office re: plan for PEG tube [as per Dr.Elliot] it was planned for wed/july 11th. If that is still the case- pts knows about it.   Pt has PET ar 12:30 tomorrow/7-10;; and he could be admitted to hospitalist service for PEG on 7/11. I will call hospitalist tomorrow after PET for direct admission.

## 2016-12-09 NOTE — Telephone Encounter (Signed)
msg left for Dr. Percell Boston nurse to return my phone call.  Also received msg from scheduling team that pt's insurance has not yet approved the pet scan. Per Dr. Rogue Bussing - pet scan needs to be post poned until next week.

## 2016-12-10 ENCOUNTER — Telehealth: Payer: Self-pay | Admitting: Internal Medicine

## 2016-12-10 ENCOUNTER — Ambulatory Visit
Admission: RE | Admit: 2016-12-10 | Discharge: 2016-12-10 | Disposition: A | Discharge status: Home / Self Care | Payer: Medicare Other | Source: Ambulatory Visit | Attending: Internal Medicine | Admitting: Internal Medicine

## 2016-12-10 ENCOUNTER — Observation Stay: Payer: Medicare Other

## 2016-12-10 ENCOUNTER — Observation Stay
Admission: AD | Admit: 2016-12-10 | Discharge: 2016-12-12 | Disposition: A | Discharge status: Home / Self Care | Payer: Medicare Other | Source: Ambulatory Visit | Attending: Internal Medicine | Admitting: Internal Medicine

## 2016-12-10 DIAGNOSIS — K219 Gastro-esophageal reflux disease without esophagitis: Secondary | ICD-10-CM | POA: Diagnosis not present

## 2016-12-10 DIAGNOSIS — Z7901 Long term (current) use of anticoagulants: Secondary | ICD-10-CM | POA: Diagnosis not present

## 2016-12-10 DIAGNOSIS — Z79899 Other long term (current) drug therapy: Secondary | ICD-10-CM | POA: Insufficient documentation

## 2016-12-10 DIAGNOSIS — Z7902 Long term (current) use of antithrombotics/antiplatelets: Secondary | ICD-10-CM | POA: Diagnosis not present

## 2016-12-10 DIAGNOSIS — I129 Hypertensive chronic kidney disease with stage 1 through stage 4 chronic kidney disease, or unspecified chronic kidney disease: Secondary | ICD-10-CM | POA: Diagnosis not present

## 2016-12-10 DIAGNOSIS — C159 Malignant neoplasm of esophagus, unspecified: Secondary | ICD-10-CM | POA: Diagnosis present

## 2016-12-10 DIAGNOSIS — R059 Cough, unspecified: Secondary | ICD-10-CM

## 2016-12-10 DIAGNOSIS — Z86711 Personal history of pulmonary embolism: Secondary | ICD-10-CM | POA: Diagnosis not present

## 2016-12-10 DIAGNOSIS — N400 Enlarged prostate without lower urinary tract symptoms: Secondary | ICD-10-CM | POA: Insufficient documentation

## 2016-12-10 DIAGNOSIS — N183 Chronic kidney disease, stage 3 (moderate): Secondary | ICD-10-CM | POA: Insufficient documentation

## 2016-12-10 DIAGNOSIS — R4702 Dysphasia: Secondary | ICD-10-CM | POA: Insufficient documentation

## 2016-12-10 DIAGNOSIS — R9402 Abnormal brain scan: Secondary | ICD-10-CM

## 2016-12-10 DIAGNOSIS — E785 Hyperlipidemia, unspecified: Secondary | ICD-10-CM | POA: Insufficient documentation

## 2016-12-10 DIAGNOSIS — Z87891 Personal history of nicotine dependence: Secondary | ICD-10-CM | POA: Diagnosis not present

## 2016-12-10 DIAGNOSIS — Z923 Personal history of irradiation: Secondary | ICD-10-CM | POA: Diagnosis not present

## 2016-12-10 DIAGNOSIS — Z86718 Personal history of other venous thrombosis and embolism: Secondary | ICD-10-CM | POA: Diagnosis not present

## 2016-12-10 DIAGNOSIS — Z8673 Personal history of transient ischemic attack (TIA), and cerebral infarction without residual deficits: Secondary | ICD-10-CM | POA: Diagnosis not present

## 2016-12-10 DIAGNOSIS — Z9221 Personal history of antineoplastic chemotherapy: Secondary | ICD-10-CM | POA: Diagnosis not present

## 2016-12-10 DIAGNOSIS — R079 Chest pain, unspecified: Secondary | ICD-10-CM

## 2016-12-10 DIAGNOSIS — R05 Cough: Secondary | ICD-10-CM

## 2016-12-10 DIAGNOSIS — R1310 Dysphagia, unspecified: Secondary | ICD-10-CM

## 2016-12-10 DIAGNOSIS — C155 Malignant neoplasm of lower third of esophagus: Secondary | ICD-10-CM

## 2016-12-10 LAB — COMPREHENSIVE METABOLIC PANEL
ALBUMIN: 2.9 g/dL — AB (ref 3.5–5.0)
ALT: 9 U/L — AB (ref 17–63)
AST: 15 U/L (ref 15–41)
Alkaline Phosphatase: 120 U/L (ref 38–126)
Anion gap: 10 (ref 5–15)
BUN: 39 mg/dL — ABNORMAL HIGH (ref 6–20)
CHLORIDE: 109 mmol/L (ref 101–111)
CO2: 21 mmol/L — AB (ref 22–32)
CREATININE: 1.32 mg/dL — AB (ref 0.61–1.24)
Calcium: 8.6 mg/dL — ABNORMAL LOW (ref 8.9–10.3)
GFR calc non Af Amer: 48 mL/min — ABNORMAL LOW (ref >60)
GFR, EST AFRICAN AMERICAN: 56 mL/min — AB (ref >60)
Glucose, Bld: 112 mg/dL — ABNORMAL HIGH (ref 65–99)
Potassium: 3.8 mmol/L (ref 3.5–5.1)
SODIUM: 140 mmol/L (ref 135–145)
Total Bilirubin: 0.5 mg/dL (ref 0.3–1.2)
Total Protein: 7 g/dL (ref 6.5–8.1)

## 2016-12-10 LAB — HEPARIN LEVEL (UNFRACTIONATED): Heparin Unfractionated: 1 IU/mL — ABNORMAL HIGH (ref 0.30–0.70)

## 2016-12-10 LAB — CBC
HCT: 28.7 % — ABNORMAL LOW (ref 40.0–52.0)
Hemoglobin: 9.7 g/dL — ABNORMAL LOW (ref 13.0–18.0)
MCH: 31.8 pg (ref 26.0–34.0)
MCHC: 33.9 g/dL (ref 32.0–36.0)
MCV: 94 fL (ref 80.0–100.0)
PLATELETS: 229 10*3/uL (ref 150–440)
RBC: 3.05 MIL/uL — AB (ref 4.40–5.90)
RDW: 14.3 % (ref 11.5–14.5)
WBC: 6.2 10*3/uL (ref 3.8–10.6)

## 2016-12-10 LAB — APTT: APTT: 32 s (ref 24–36)

## 2016-12-10 LAB — GLUCOSE, CAPILLARY: GLUCOSE-CAPILLARY: 102 mg/dL — AB (ref 65–99)

## 2016-12-10 LAB — PROTIME-INR
INR: 1.25
Prothrombin Time: 15.8 seconds — ABNORMAL HIGH (ref 11.4–15.2)

## 2016-12-10 MED ORDER — HEPARIN (PORCINE) IN NACL 100-0.45 UNIT/ML-% IJ SOLN
1250.0000 [IU]/h | INTRAMUSCULAR | Status: AC
  Administered 2016-12-10: 1250 [IU]/h via INTRAVENOUS
  Filled 2016-12-10: qty 250

## 2016-12-10 MED ORDER — SODIUM CHLORIDE 0.9 % IV SOLN
INTRAVENOUS | Status: DC
  Administered 2016-12-10 – 2016-12-12 (×4): via INTRAVENOUS

## 2016-12-10 MED ORDER — DORZOLAMIDE HCL 2 % OP SOLN
1.0000 [drp] | Freq: Two times a day (BID) | OPHTHALMIC | Status: DC
  Administered 2016-12-10 – 2016-12-12 (×3): 1 [drp] via OPHTHALMIC
  Filled 2016-12-10: qty 10

## 2016-12-10 MED ORDER — ENOXAPARIN SODIUM 40 MG/0.4ML ~~LOC~~ SOLN: 40.0000 mg | SUBCUTANEOUS | Status: DC

## 2016-12-10 MED ORDER — ONDANSETRON HCL 4 MG PO TABS: 4.0000 mg | ORAL_TABLET | Freq: Four times a day (QID) | ORAL | Status: DC | PRN

## 2016-12-10 MED ORDER — SUCRALFATE 1 G PO TABS
1.0000 g | ORAL_TABLET | Freq: Three times a day (TID) | ORAL | Status: DC
  Administered 2016-12-10 – 2016-12-12 (×6): 1 g via ORAL
  Filled 2016-12-10 (×6): qty 1

## 2016-12-10 MED ORDER — CYCLOBENZAPRINE HCL 10 MG PO TABS: 5.0000 mg | ORAL_TABLET | Freq: Three times a day (TID) | ORAL | Status: DC | PRN

## 2016-12-10 MED ORDER — ACETAMINOPHEN 650 MG RE SUPP: 650.0000 mg | Freq: Four times a day (QID) | RECTAL | Status: DC | PRN

## 2016-12-10 MED ORDER — ACETAMINOPHEN 325 MG PO TABS: 650.0000 mg | ORAL_TABLET | Freq: Four times a day (QID) | ORAL | Status: DC | PRN

## 2016-12-10 MED ORDER — ENSURE ENLIVE PO LIQD
237.0000 mL | Freq: Two times a day (BID) | ORAL | Status: DC
  Administered 2016-12-10: 237 mL via ORAL

## 2016-12-10 MED ORDER — METOPROLOL SUCCINATE ER 25 MG PO TB24
25.0000 mg | ORAL_TABLET | Freq: Every day | ORAL | Status: DC
  Administered 2016-12-10 – 2016-12-12 (×3): 25 mg via ORAL
  Filled 2016-12-10 (×3): qty 1

## 2016-12-10 MED ORDER — VITAMIN B-12 1000 MCG PO TABS
500.0000 ug | ORAL_TABLET | Freq: Every day | ORAL | Status: DC
  Administered 2016-12-12: 500 ug via ORAL
  Filled 2016-12-10: qty 1

## 2016-12-10 MED ORDER — ATORVASTATIN CALCIUM 20 MG PO TABS
20.0000 mg | ORAL_TABLET | Freq: Every day | ORAL | Status: DC
  Administered 2016-12-12: 11:00:00 20 mg via ORAL
  Filled 2016-12-10: qty 1

## 2016-12-10 MED ORDER — FINASTERIDE 5 MG PO TABS
5.0000 mg | ORAL_TABLET | Freq: Every day | ORAL | Status: DC
  Administered 2016-12-10 – 2016-12-12 (×3): 5 mg via ORAL
  Filled 2016-12-10 (×3): qty 1

## 2016-12-10 MED ORDER — MAGNESIUM OXIDE 400 (241.3 MG) MG PO TABS
400.0000 mg | ORAL_TABLET | Freq: Every day | ORAL | Status: DC
  Filled 2016-12-10: qty 1

## 2016-12-10 MED ORDER — AMLODIPINE BESYLATE 5 MG PO TABS
2.5000 mg | ORAL_TABLET | Freq: Every day | ORAL | Status: DC
  Administered 2016-12-10 – 2016-12-12 (×2): 2.5 mg via ORAL
  Filled 2016-12-10 (×2): qty 1

## 2016-12-10 MED ORDER — ONDANSETRON HCL 4 MG/2ML IJ SOLN: 4.0000 mg | Freq: Four times a day (QID) | INTRAMUSCULAR | Status: DC | PRN

## 2016-12-10 MED ORDER — FLUDEOXYGLUCOSE F - 18 (FDG) INJECTION
12.8200 | Freq: Once | INTRAVENOUS | Status: AC | PRN
  Administered 2016-12-10: 12.82 via INTRAVENOUS

## 2016-12-10 MED ORDER — PANTOPRAZOLE SODIUM 40 MG PO TBEC
40.0000 mg | DELAYED_RELEASE_TABLET | Freq: Every day | ORAL | Status: DC
  Administered 2016-12-10 – 2016-12-12 (×2): 40 mg via ORAL
  Filled 2016-12-10 (×2): qty 1

## 2016-12-10 MED ORDER — LATANOPROST 0.005 % OP SOLN
1.0000 [drp] | Freq: Every day | OPHTHALMIC | Status: DC
  Administered 2016-12-10 – 2016-12-11 (×2): 1 [drp] via OPHTHALMIC
  Filled 2016-12-10: qty 2.5

## 2016-12-10 MED ORDER — CEFAZOLIN SODIUM-DEXTROSE 2-4 GM/100ML-% IV SOLN
2.0000 g | Freq: Once | INTRAVENOUS | Status: DC
  Filled 2016-12-10: qty 100

## 2016-12-10 MED ORDER — TIMOLOL MALEATE 0.5 % OP SOLN
1.0000 [drp] | Freq: Two times a day (BID) | OPHTHALMIC | Status: DC
  Administered 2016-12-10 – 2016-12-12 (×3): 1 [drp] via OPHTHALMIC
  Filled 2016-12-10: qty 5

## 2016-12-10 NOTE — Telephone Encounter (Signed)
Spoke with Jody in pet scan dept. Pt presently having pet scan performed. - unable to speak to patient at this time to confirm if pt has held the plavix/eliquis

## 2016-12-10 NOTE — Consult Note (Signed)
Marcus [name] is an 83 y.o. male.   Chief Complaint: Difficulty swallowing HPI: He has a history of esophageal cancer first discovered last year. He has been treated with radiation therapy and also with chemotherapy. He reports in the last few weeks has been having increasing difficulty swallowing. He is able to swallow liquids however when he swallows any solid food he develops pain and chest. He is loss 49 pounds since he found that he had esophageal cancer. He is continued to lose weight in the last few weeks. He has been able to take some full liquids today. He reports no nausea. Does find that he swallows solid food sometimes it may come back up.  He is been admitted in anticipation of percutaneous endoscopic gastrostomy. He stopped his Plavix 5 days ago. He stopped his Eliquis 3 days ago. He is currently on a heparin drip    Past Medical History:  Diagnosis Date  . Aphakia of right eye   . Arthritis   . Cancer ([place])    SKIN  . Enlarged prostate without lower urinary tract symptoms (luts)   . Esophageal cancer ([place])   . GERD (gastroesophageal reflux disease)   . Glaucoma   . Gout   . History of CVA (cerebrovascular accident)    Cerebral infarction Nov. 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 History of Doppler ultrasound    dopplers revealed mild left ICA stenosis which has remained stable, occluded left subclavian artery with retrograde left vertebral filling and moderately severe right subclavian artery stenosis. he has no symptoms of upper extermity claudication or subclavian steal symptoms.   . History of DVT (deep vein thrombosis)    right arm  . History of stress test 09/02/2009   abnormal myocardial perfusion scan demonstrating an attenuation defect in the inferior region of the myocardium. No ischemia or infarct/scar is seen in the remaining myocardium. No prior study available for comparison. Abnormal myocardial study EF 79%  . Hyperlipemia   . Hypertension    . Macular degeneration   . Pulmonary embolus ([place])   . Stroke ([place])   . Subclavian artery stenosis ([facility])   I reviewed his past medical history as noted above. He does have history of deep venous thrombosis and pulmonary embolism and has been on anticoagulant therapy with Eliquis. Also reviewed his history of cerebrovascular accident with persistent mild weakness and numbness of the right leg and right arm.  He also has occlusion of left subclavian artery and stenosis of right subclavian artery.  Other past medical history was reviewed as noted above.  Past Surgical History:  Procedure Laterality Date  . CATARACT EXTRACTION    . COLONOSCOPY  2002  . ESOPHAGOGASTRODUODENOSCOPY (EGD) WITH PROPOFOL N/A 11/10/2015   Procedure: ESOPHAGOGASTRODUODENOSCOPY (EGD) WITH PROPOFOL;  Surgeon: Manya Silvas, MD;  Location[facility] ENDOSCOPY;  Service: Endoscopy;  Laterality: N/A;  . ESOPHAGOGASTRODUODENOSCOPY (EGD) WITH PROPOFOL N/A 04/29/2016   Procedure: ESOPHAGOGASTRODUODENOSCOPY (EGD) WITH PROPOFOL;  Surgeon: Manya Silvas, MD;  Location: [facility] ENDOSCOPY;  Service: Endoscopy;  Laterality: N/A;  . ESOPHAGOGASTRODUODENOSCOPY (EGD) WITH PROPOFOL N/A 10/07/2016   Procedure: ESOPHAGOGASTRODUODENOSCOPY (EGD) WITH PROPOFOL;  Surgeon: Manya Silvas, MD;  Location: [facility] ENDOSCOPY;  Service: Endoscopy;  Laterality: N/A;  . FRACTURE SURGERY    . MOHS SURGERY    . PERIPHERAL VASCULAR CATHETERIZATION N/A 11/29/2015   Procedure: Glori Luis Cath Insertion;  Surgeon: Algernon Huxley, MD;  Location: [facility];  Service: Cardiovascular;  Laterality: N/A;  He has had no abdominal surgery. He did have upper endoscopy on 10/07/2016. This did demonstrate irregular soft mucosa of the distal esophagus. The scope was advanced into the normal-appearing stomach and normal appearing duodenum.  Family History  Problem Relation Age of Onset  . Heart disease Brother   . Cancer - Colon Sister   . Cancer Sister   . Other  Brother        CABG   Social History:  reports that he quit smoking about 23 years ago. His smoking use included Cigarettes. He has a 40.00 pack-year smoking history. He has never used smokeless tobacco. He reports that he does not drink alcohol or use drugs.  He is accompanied by his wife.  REVIEW of SYSTEMS:  He reports continued weight loss particularly with the difficulty swallowing. He reports recent fatigue and decreased activity level. He is able to walk some within his home. He reports no recent visual changes. He reports no swollen glands in his neck, no heartburn. He reports he has been having some pain when he swallows solid food. Also some pain in the left side of his chest. He reports some difficulty breathing with exertion. He reports some mild abdominal discomfort. He reports has been having fewer bowel movements than normal but did have a good bowel movement yesterday. He also reports he has been recently voiding satisfactorily. He reports no recent ankle edema. No recent source or boils. Review of systems otherwise negative  Allergies:  Allergies  Allergen Reactions  . Codeine Other (See Comments) and Anaphylaxis    Swollen tongue, numb around mouth, slurred speech Swollen tongue, numb around mouth, slurred speech  . Allopurinol Other (See Comments)    Hand pain  . Simvastatin Other (See Comments)    myalgias  . Uloric [Febuxostat] Other (See Comments)    Stomach pain    Medications Prior to Admission  Medication Sig Dispense Refill  . acetaminophen (TYLENOL) 500 MG tablet Take 500 mg by mouth every 6 (six) hours as needed.    Marland Kitchen amLODipine (NORVASC) 2.5 MG tablet Take 2.5 mg by mouth daily.  0  . apixaban (ELIQUIS) 5 MG TABS tablet Take 1 tablet (5 mg total) by mouth 2 (two) times daily. Start on 03/03/2016 60 tablet 0  . atorvastatin (LIPITOR) 20 MG tablet Take 1 tablet (20 mg total) by mouth daily at 6 PM. (Patient taking differently: Take 20 mg by mouth daily with  breakfast. ) 30 tablet 1  . calcium carbonate (TUMS EX) 750 MG chewable tablet Chew 1 tablet by mouth daily.    . clopidogrel (PLAVIX) 75 MG tablet Take 1 tablet (75 mg total) by mouth daily. 30 tablet 1  . Coenzyme Q10 (COQ10) 100 MG CAPS Take 100 mg by mouth daily. Reported on 11/10/2015    . cyanocobalamin 500 MCG tablet Take 500 mcg by mouth daily.    Marland Kitchen dexamethasone (DECADRON) 4 MG tablet Take 8 mg by mouth as directed. 8 mg the day after chemo for 2 days following chemotherapy  0  . dorzolamide (TRUSOPT) 2 % ophthalmic solution Place 1 drop into both eyes 2 (two) times daily.    . finasteride (PROSCAR) 5 MG tablet Take 5 mg by mouth daily.  10  . latanoprost (XALATAN) 0.005 % ophthalmic solution Place 1 drop into both eyes at bedtime.    . lidocaine-prilocaine (EMLA) cream Apply 1 application topically as needed. Apply generously over the Mediport 45 minutes prior to chemotherapy. 30 g 0  .  magnesium oxide (MAG-OX) 400 MG tablet Take 400 mg by mouth daily.    . metoprolol succinate (TOPROL-XL) 25 MG 24 hr tablet TAKE ONE TABLET (25 MG TOTAL) BY MOUTH DAILY.    Marland Kitchen omeprazole (PRILOSEC) 20 MG capsule Take 20 mg by mouth 2 (two) times daily before a meal.    . ondansetron (ZOFRAN) 8 MG tablet Take 1 tablet (8 mg total) by mouth every 8 (eight) hours as needed for nausea or vomiting (start 3 days; after chemo). (Patient not taking: Reported on 12/06/2016) 40 tablet 1  . prochlorperazine (COMPAZINE) 10 MG tablet Take 1 tablet (10 mg total) by mouth every 6 (six) hours as needed for nausea or vomiting. (Patient not taking: Reported on 10/14/2016) 40 tablet 1  . sucralfate (CARAFATE) 1 g tablet Take 1 g by mouth 4 (four) times daily.    Marland Kitchen terazosin (HYTRIN) 10 MG capsule Take 10 mg by mouth at bedtime.  0  . timolol (TIMOPTIC) 0.5 % ophthalmic solution Place 1 drop into both eyes 2 (two) times daily.  3  . Vitamin D, Ergocalciferol, (DRISDOL) 50000 UNITS CAPS Take 50,000 Units by mouth every 7 (seven)  days.      Results for orders placed or performed during the hospital encounter of 12/10/16 (from the past 48 hour(s))  CBC     Status: Abnormal   Collection Time: 12/10/16  4:59 PM  Result Value Ref Range   WBC 6.2 3.8 - 10.6 K/uL   RBC 3.05 (L) 4.40 - 5.90 MIL/uL   Hemoglobin 9.7 (L) 13.0 - 18.0 g/dL   HCT 28.7 (L) 40.0 - 52.0 %   MCV 94.0 80.0 - 100.0 fL   MCH 31.8 26.0 - 34.0 pg   MCHC 33.9 32.0 - 36.0 g/dL   RDW 14.3 11.5 - 14.5 %   Platelets 229 150 - 440 K/uL  Comprehensive metabolic panel     Status: Abnormal   Collection Time: 12/10/16  4:59 PM  Result Value Ref Range   Sodium 140 135 - 145 mmol/L   Potassium 3.8 3.5 - 5.1 mmol/L   Chloride 109 101 - 111 mmol/L   CO2 21 (L) 22 - 32 mmol/L   Glucose, Bld 112 (H) 65 - 99 mg/dL   BUN 39 (H) 6 - 20 mg/dL   Creatinine, Ser 1.32 (H) 0.61 - 1.24 mg/dL   Calcium 8.6 (L) 8.9 - 10.3 mg/dL   Total Protein 7.0 6.5 - 8.1 g/dL   Albumin 2.9 (L) 3.5 - 5.0 g/dL   AST 15 15 - 41 U/L   ALT 9 (L) 17 - 63 U/L   Alkaline Phosphatase 120 38 - 126 U/L   Total Bilirubin 0.5 0.3 - 1.2 mg/dL   GFR calc non Af Amer 48 (L) >60 mL/min   GFR calc Af Amer 56 (L) >60 mL/min    Comment: (NOTE) The eGFR has been calculated using the CKD EPI equation. This calculation has not been validated in all clinical situations. eGFR's persistently <60 mL/min signify possible Chronic Kidney Disease.    Anion gap 10 5 - 15  Protime-INR     Status: Abnormal   Collection Time: 12/10/16  4:59 PM  Result Value Ref Range   Prothrombin Time 15.8 (H) 11.4 - 15.2 seconds   INR 1.25   APTT     Status: None   Collection Time: 12/10/16  4:59 PM  Result Value Ref Range   aPTT 32 24 - 36 seconds  Heparin level (unfractionated)  Status: Abnormal   Collection Time: 12/10/16  4:59 PM  Result Value Ref Range   Heparin Unfractionated 1.00 (H) 0.30 - 0.70 IU/mL    Comment:        IF HEPARIN RESULTS ARE BELOW EXPECTED VALUES, AND PATIENT DOSAGE HAS BEEN  CONFIRMED, SUGGEST FOLLOW UP TESTING OF ANTITHROMBIN III LEVELS.    Nm Pet Image Restag (ps) Skull Base To Thigh  Result Date: 12/10/2016 CLINICAL DATA:  Subsequent treatment strategy for distal esophageal neoplasm. EXAM: NUCLEAR MEDICINE PET SKULL BASE TO THIGH TECHNIQUE: 12.8 mCi F-18 FDG was injected intravenously. Full-ring PET imaging was performed from the skull base to thigh after the radiotracer. CT data was obtained and used for attenuation correction and anatomic localization. FASTING BLOOD GLUCOSE:  Value: 102 mg/dl COMPARISON:  PET-CT 09/04/2016 FINDINGS: NECK No hypermetabolic lymph nodes in the neck. Focus of uptake in the midline cerebellum is nonspecific. CHEST Intense metabolic activity through the GE junction is increased with SUV max equal 15.2 new increased from 11.0. There is new fluid retained within the esophagus. Lobular nodule in the LEFT lower lobe is unchanged in size at 16 mm. No significant metabolic activity. Small effusion. ABDOMEN/PELVIS No hypermetabolic activity in the liver. No hypermetabolic gastrohepatic ligament lymph nodes. No hypermetabolic abdominopelvic lymph nodes. SKELETON There is new hypermetabolic activity associated with a lytic lesion in the lateral aspect of the LEFT fourth rib with SUV max equal 9.3. A similar lytic lesion in the LEFT scapula/glenoid fossa and RIGHT body of the scapula. Focus of activity in the LEFT gluteus muscle is indeterminate. IMPRESSION: 1. 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 2. Increased in metabolic activity at the GE junction lesion. There is new fluid retention within the esophagus. 3. Focal activity in the midline cerebellum is indeterminate. Recommend MRI brain with contrast to exclude brain metastasis. 4. Potential soft tissue metastasis to the LEFT gluteus muscle. Indeterminate. 5. Stable pulmonary nodules without metabolic activity. Electronically Signed   By: Suzy Bouchard  M.D.   On: 12/10/2016 17:38    Blood pressure (!) 156/55, pulse 66, temperature 97.8 F (36.6 C), temperature source Oral, resp. rate 20, height 6' 2"  (1.88 m), weight 172 lb 12.8 oz (78.4 kg), SpO2 100 %.  Physical Exam: GEN.: He is awake alert and oriented resting in the hospital bed.  HEENT:  Head is normocephalic.  Pupils are equal reactive to light.  Extraocular movements are intact. Sclera is clear.  Pharynx is clear.  SKIN: Warm and dry without rash  NECK:  Supple with no palpable mass and no adenopathy.  LUNGS:  Clear without rales rhonchi or wheezes.  HEART:  Regular rhythm S1-S2, without heart murmur. Does have subclavian bruit on the right side  ABDOMEN: Soft flat and nontender with no palpable mass and no scars.  NEUROLOGIC:  Awake alert and moving all extremities.  EXTREMITIES:  Well-developed well-nourished with no dependent edema. There are multiple nodules related to his hands and right elbow and right knee which have appearance of rheumatoid nodules.  Assessment/Plan Esophageal cancer with significant dysphasia and weight loss. History of deep venous thrombosis and pulmonary embolism on chronic anticoagulant therapy History of cerebrovascular accident on chronic platelet inhibitor therapy  I have discussed this with Dr. Vira Agar. We are recommending in planning percutaneous endoscopic gastrostomy for tomorrow. Plan a preop prophylactic antibiotic. Plan to stop the heparin drip 8 hours before this time of surgery. I discussed the operation care risk and benefits  with him in detail. Also discussed alternative of laparoscopy or laparotomy.  Rochel Brome, MD 12/10/2016, 7:12 PM

## 2016-12-10 NOTE — H&P (Signed)
[facility] [place] NAME: Marcus [name]    MR#:  [id number]  DATE OF BIRTH:  09-04-1931  DATE OF ADMISSION:  12/10/2016  PRIMARY CARE PHYSICIAN: Chesley Noon, MD   REQUESTING/REFERRING PHYSICIAN: Dr. Charlaine Dalton  CHIEF COMPLAINT:  No chief complaint on file.   HISTORY OF PRESENT ILLNESS:  Marcus [name]  is a 83 y.o. male with a known history of Esophageal adenocarcinoma status post chemoradiation, history of DVT and PE on eliquis, CKD, history of CVA with minimal right-sided paresthesias residual, BPH, macular degeneration presents to hospital secondary to worsening dysphagia and need for PEG tube. Patient was diagnosed with esophageal adenocarcinoma in June 2017, finished chemoradiation by August 2017. And has been doing well. However developed significant difficulty swallowing mostly to solids that has been progressively worsening in the last 3 weeks. Currently pretty much on a liquid diet. Had a barium esophagogram done as an outpatient that showed significant stricture at the distal end of esophagus. The last PET scan showed some thickening of the esophagus but the follow-up biopsy was negative in April 2018. He had a repeat PET scan done today, and was recommended admission by the GI physician to do PEG tube tomorrow. Patient has been off of his eliquis and Plavix for the last 3 days. He is complaining of left-sided musculoskeletal chest pain which gets worse with movement and coughing. He's been having cough with mucoid phlegm for the last couple of days. Denies any hemoptysis, tachypnea or difficulty breathing.  PAST MEDICAL HISTORY:   Past Medical History:  Diagnosis Date  . Aphakia of right eye   . Arthritis   . Cancer ([place])    SKIN  . Enlarged prostate without lower urinary tract symptoms (luts)   . Esophageal cancer ([place])   . GERD (gastroesophageal reflux disease)   . Glaucoma   . Gout   . History of CVA  (cerebrovascular accident)    Cerebral infarction Nov. 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 History of Doppler ultrasound    dopplers revealed mild left ICA stenosis which has remained stable, occluded left subclavian artery with retrograde left vertebral filling and moderately severe right subclavian artery stenosis. he has no symptoms of upper extermity claudication or subclavian steal symptoms.   . History of DVT (deep vein thrombosis)    right arm  . History of stress test 09/02/2009   abnormal myocardial perfusion scan demonstrating an attenuation defect in the inferior region of the myocardium. No ischemia or infarct/scar is seen in the remaining myocardium. No prior study available for comparison. Abnormal myocardial study EF 79%  . Hyperlipemia   . Hypertension   . Macular degeneration   . Pulmonary embolus ([place])   . Stroke ([place])   . Subclavian artery stenosis ([facility])     PAST SURGICAL HISTORY:   Past Surgical History:  Procedure Laterality Date  . CATARACT EXTRACTION    . COLONOSCOPY  2002  . ESOPHAGOGASTRODUODENOSCOPY (EGD) WITH PROPOFOL N/A 11/10/2015   Procedure: ESOPHAGOGASTRODUODENOSCOPY (EGD) WITH PROPOFOL;  Surgeon: Manya Silvas, MD;  [facility] END[facility];  Service: Endoscopy;  Laterality: N/A;  . ESOPHAGOGASTRODUODENOSCOPY (EGD) WITH PROPOFOL N/A 04/29/2016   Procedure: ESOPHAGOGASTRODUODENOSCOPY (EGD) WITH PROPOFOL;  Surgeon: Manya Silvas, MD;  Location: [facility] ENDOSCOPY;  Service: Endoscopy;  Laterality: N/A;  . ESOPHAGOGASTRODUODENOSCOPY (EGD) WITH PROPOFOL N/A 10/07/2016   Procedure: ESOPHAGOGASTRODUODENOSCOPY (EGD) WITH PROPOFOL;  Surgeon: Manya Silvas, MD;  Location: [facility]  ENDOSCOPY;  Service: Endoscopy;  Laterality: N/A;  . FRACTURE SURGERY    . MOHS SURGERY    . PERIPHERAL VASCULAR CATHETERIZATION N/A 11/29/2015   Procedure: Glori Luis Cath Insertion;  Surgeon: Algernon Huxley, MD;  [facility];  Service: Cardiovascular;   Laterality: N/A;    SOCIAL HISTORY:   Social History  Substance Use Topics  . Smoking status: Former Smoker    Packs/day: 1.00    Years: 40.00    Types: Cigarettes    Quit date: 06/03/1993  . Smokeless tobacco: Never Used  . Alcohol use No    FAMILY HISTORY:   Family History  Problem Relation Age of Onset  . Heart disease Brother   . Cancer - Colon Sister   . Cancer Sister   . Other Brother        CABG    DRUG ALLERGIES:   Allergies  Allergen Reactions  . Codeine Other (See Comments) and Anaphylaxis    Swollen tongue, numb around mouth, slurred speech Swollen tongue, numb around mouth, slurred speech  . Allopurinol Other (See Comments)    Hand pain  . Simvastatin Other (See Comments)    myalgias  . Uloric [Febuxostat] Other (See Comments)    Stomach pain    REVIEW OF SYSTEMS:   Review of Systems  Constitutional: Positive for malaise/fatigue. Negative for chills, fever and weight loss.  HENT: Negative for ear discharge, ear pain, hearing loss, nosebleeds and tinnitus.   Eyes: Negative for blurred vision, double vision and photophobia.  Respiratory: Negative for cough, hemoptysis, shortness of breath and wheezing.   Cardiovascular: Positive for chest pain. Negative for palpitations, orthopnea and leg swelling.  Gastrointestinal: Positive for nausea. Negative for abdominal pain, constipation, diarrhea, heartburn, melena and vomiting.       Dysphagia  Genitourinary: Negative for dysuria, frequency, hematuria and urgency.  Musculoskeletal: Positive for myalgias. Negative for back pain and neck pain.  Skin: Negative for rash.  Neurological: Positive for tingling. Negative for dizziness, tremors, sensory change, speech change, focal weakness and headaches.  Endo/Heme/Allergies: Does not bruise/bleed easily.  Psychiatric/Behavioral: Negative for depression.    MEDICATIONS AT HOME:   Prior to Admission medications   Medication Sig Start Date End Date Taking?  Authorizing Provider  acetaminophen (TYLENOL) 500 MG tablet Take 500 mg by mouth every 6 (six) hours as needed.    [provider]  amLODipine (NORVASC) 2.5 MG tablet Take 2.5 mg by mouth daily. 02/11/16   [provider]  apixaban (ELIQUIS) 5 MG TABS tablet Take 1 tablet (5 mg total) by mouth 2 (two) times daily. Start on 03/03/2016 03/03/16   Cristal Ford, DO  atorvastatin (LIPITOR) 20 MG tablet Take 1 tablet (20 mg total) by mouth daily at 6 PM. Patient taking differently: Take 20 mg by mouth daily with breakfast.  12/26/14   Delfina Redwood, MD  calcium carbonate (TUMS EX) 750 MG chewable tablet Chew 1 tablet by mouth daily.    [provider]  clopidogrel (PLAVIX) 75 MG tablet Take 1 tablet (75 mg total) by mouth daily. 12/26/14   Delfina Redwood, MD  Coenzyme Q10 (COQ10) 100 MG CAPS Take 100 mg by mouth daily. Reported on 11/10/2015    [provider]  cyanocobalamin 500 MCG tablet Take 500 mcg by mouth daily.    [provider]  dexamethasone (DECADRON) 4 MG tablet Take 8 mg by mouth as directed. 8 mg the day after chemo for 2 days following chemotherapy  12/04/15   [provider]  dorzolamide (TRUSOPT) 2 % ophthalmic solution Place 1 drop into both eyes 2 (two) times daily.    [provider]  finasteride (PROSCAR) 5 MG tablet Take 5 mg by mouth daily. 12/06/14   [provider]  latanoprost (XALATAN) 0.005 % ophthalmic solution Place 1 drop into both eyes at bedtime.    [provider]  lidocaine-prilocaine (EMLA) cream Apply 1 application topically as needed. Apply generously over the Mediport 45 minutes prior to chemotherapy. 11/24/15   Cammie Sickle, MD  magnesium oxide (MAG-OX) 400 MG tablet Take 400 mg by mouth daily.    [provider]  metoprolol succinate (TOPROL-XL) 25 MG 24 hr tablet TAKE ONE TABLET (25 MG TOTAL) BY MOUTH DAILY. 04/19/15   [provider]  omeprazole  (PRILOSEC) 20 MG capsule Take 20 mg by mouth 2 (two) times daily before a meal.    [provider]  ondansetron (ZOFRAN) 8 MG tablet Take 1 tablet (8 mg total) by mouth every 8 (eight) hours as needed for nausea or vomiting (start 3 days; after chemo). Patient not taking: Reported on 12/06/2016 11/24/15   Cammie Sickle, MD  prochlorperazine (COMPAZINE) 10 MG tablet Take 1 tablet (10 mg total) by mouth every 6 (six) hours as needed for nausea or vomiting. Patient not taking: Reported on 10/14/2016 11/24/15   Cammie Sickle, MD  sucralfate (CARAFATE) 1 g tablet Take 1 g by mouth 4 (four) times daily.    [provider]  terazosin (HYTRIN) 10 MG capsule Take 10 mg by mouth at bedtime. 05/01/16   [provider]  timolol (TIMOPTIC) 0.5 % ophthalmic solution Place 1 drop into both eyes 2 (two) times daily. 10/27/14   [provider]  Vitamin D, Ergocalciferol, (DRISDOL) 50000 UNITS CAPS Take 50,000 Units by mouth every 7 (seven) days.    [provider]      VITAL SIGNS:  Blood pressure (!) 156/55, pulse 66, temperature 97.8 F (36.6 C), temperature source Oral, resp. rate 20, SpO2 100 %.  PHYSICAL EXAMINATION:   Physical Exam  GENERAL:  84 y.o.-year-old patient lying in the bed with no acute distress.  EYES: Pupils equal, round, reactive to light and accommodation. Ptosis of left eyelid. No scleral icterus. Extraocular muscles intact.  HEENT: Head atraumatic, normocephalic. Oropharynx and nasopharynx clear.  NECK:  Supple, no jugular venous distention. No thyroid enlargement, no tenderness.  LUNGS: Normal breath sounds bilaterally, no wheezing, rales,rhonchi or crepitation. No use of accessory muscles of respiration. Decreased bibasilar breath sounds. CARDIOVASCULAR: S1, S2 normal. No rubs, or gallops. 2/6 systolic murmur is present ABDOMEN: Soft, nontender, nondistended. Bowel sounds present. No organomegaly or mass.  EXTREMITIES: No pedal  edema, cyanosis, or clubbing.  NEUROLOGIC: Cranial nerves II through XII are intact. Muscle strength 5/5 in all extremities. Sensation intact. Gait not checked.  PSYCHIATRIC: The patient is alert and oriented x 3.  SKIN: No obvious rash, lesion, or ulcer.   LABORATORY PANEL:   CBC  Recent Labs Lab 12/06/16 1132  WBC 7.3  HGB 9.9*  HCT 29.4*  PLT 264   ------------------------------------------------------------------------------------------------------------------  Chemistries   Recent Labs Lab 12/06/16 1132  NA 139  K 4.4  CL 107  CO2 23  GLUCOSE 125*  BUN 42*  CREATININE 1.73*  CALCIUM 9.1  AST 17  ALT 7*  ALKPHOS 115  BILITOT 0.6   ------------------------------------------------------------------------------------------------------------------  Cardiac Enzymes No results for input(s): TROPONINI in the last 168  hours. ------------------------------------------------------------------------------------------------------------------  RADIOLOGY:  No results found.  EKG:   Orders placed or performed during the hospital encounter of 02/23/16  . EKG 12-Lead  . EKG 12-Lead    IMPRESSION AND PLAN:   Carlous Olivares  is a 83 y.o. male with a known history of Esophageal adenocarcinoma status post chemoradiation, history of DVT and PE on eliquis, history of CVA with minimal right-sided paresthesias residual, BPH, macular degeneration presents to hospital secondary to worsening dysphagia and need for PEG tube.  #1 dysphagia/odynophagia-subacute going on for 3 weeks now. Significant stricture at the distal end of esophagus based on barium esophagogram. Questionable malignancy. -GI consulted. For PEG tube tomorrow. Possible biopsies at the same time -Liquid diet for now, ensure between meals  #2 esophageal adenocarcinoma-recurrence of cancer. PET scan done today. Results are pending. -Oncology notified.  #3 history of DVT and PE in the last year-off of eliquis  for 2 days, now complaining of chest pain-likely musculoskeletal and less likely to be pleuritic. However discussed with oncology and GI. Will place him on heparin drip to bridge. Should be stopped in a.m. prior to the procedure.  #4 history of CVA-residual right-sided paresthesias, very minimal. Off Plavix now. On heparin drip for bridging. On statin.  #5 BPH-continue home medications.  #6 DVT prophylaxis-on heparin drip   All the records are reviewed and case discussed with ED provider. Management plans discussed with the patient, family and they are in agreement.  CODE STATUS: Full Code  TOTAL TIME TAKING CARE OF THIS PATIENT: 50 minutes.    Gladstone Lighter M.D on 12/10/2016 at 4:11 PM  Between 7am to 6pm - Pager - [phone]  After 6pm go to [email or web address] - password [email or web address] [facility]  Office  [phone]  CC: Primary care physician; Chesley Noon, MD

## 2016-12-10 NOTE — Progress Notes (Addendum)
ANTICOAGULATION CONSULT NOTE - Initial Consult  Pharmacy Consult for Heparin dosing and monitoring  Indication: VTE prophylaxis/bridging   Allergies  Allergen Reactions  . Codeine Other (See Comments) and Anaphylaxis    Swollen tongue, numb around mouth, slurred speech Swollen tongue, numb around mouth, slurred speech  . Allopurinol Other (See Comments)    Hand pain  . Simvastatin Other (See Comments)    myalgias  . Uloric [Febuxostat] Other (See Comments)    Stomach pain    Patient Measurements: Height: 6\' 2"  (188 cm) Weight: 172 lb 12.8 oz (78.4 kg) IBW/kg (Calculated) : 82.2 Heparin Dosing Weight: 78.4  Vital Signs: Temp: 97.8 F (36.6 C) (07/10 1518) Temp Source: Oral (07/10 1518) BP: 156/55 (07/10 1518) Pulse Rate: 66 (07/10 1518)  Labs: No results for input(s): HGB, HCT, PLT, APTT, LABPROT, INR, HEPARINUNFRC, HEPRLOWMOCWT, CREATININE, CKTOTAL, CKMB, TROPONINI in the last 72 hours.  Estimated Creatinine Clearance: 35.9 mL/min (A) (by C-G formula based on SCr of 1.73 mg/dL (H)).   Medical History: Past Medical History:  Diagnosis Date  . Aphakia of right eye   . Arthritis   . Cancer ([place])    SKIN  . Enlarged prostate without lower urinary tract symptoms (luts)   . Esophageal cancer ([place])   . GERD (gastroesophageal reflux disease)   . Glaucoma   . Gout   . History of CVA (cerebrovascular accident)    Cerebral infarction Nov. 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 History of Doppler ultrasound    dopplers revealed mild left ICA stenosis which has remained stable, occluded left subclavian artery with retrograde left vertebral filling and moderately severe right subclavian artery stenosis. he has no symptoms of upper extermity claudication or subclavian steal symptoms.   . History of DVT (deep vein thrombosis)    right arm  . History of stress test 09/02/2009   abnormal myocardial perfusion scan demonstrating an attenuation defect in the inferior  region of the myocardium. No ischemia or infarct/scar is seen in the remaining myocardium. No prior study available for comparison. Abnormal myocardial study EF 79%  . Hyperlipemia   . Hypertension   . Macular degeneration   . Pulmonary embolus ([place])   . Stroke ([place])   . Subclavian artery stenosis ([facility])     Medications:  Apixaban 5mg  twice daily  Assessment: 83 yo male with history of VTE who takes Apixaban 5mg  twice daily at home. Patient is to undergo EGD on 7/11. Last apixaban dose reported on 7/8 AM.  Instructions were given by MD to stop heparin 6 hours prior to procedure on 7/11.   Goal of Therapy:  APPT: 66-102s Heparin level 0.3-0.7 units/ml Monitor platelets by anticoagulation protocol: Yes   Plan: Baseline labs ordered No bolus  Start heparin infusion at 1250 units/hr **Will need to dose by APTTs until heparin level and APTTs correlate Check APTT and anti-Xa level in 8 hours and daily while on heparin Continue to monitor H&H and platelets  Dr. Tamala Julian wants drip to stop at 0400 on 07/11   Pernell Dupre, PharmD, BCPS Clinical Pharmacist 12/10/2016 5:23 PM

## 2016-12-10 NOTE — Consult Note (Signed)
GI Inpatient Consult Note  Reason for Consult:Needs PEG tube due to cancer of esophagus.   Attending Requesting Consult:Dr. Tressia Miners and Dr. Rogue Bussing.  History of Present Illness: Marcus [name] is a 83 y.o. male with known cancer of the esophagus who has had radiation treatement and chemotherapy and now has great difficulty swallowing nourishment and a PEG placement has been requested.  His cancer was diagnosed a year ago.  He had a 40 year pack history of smoking.  Past Medical History:  Past Medical History:  Diagnosis Date  . Aphakia of right eye   . Arthritis   . Cancer ([place])    SKIN  . Enlarged prostate without lower urinary tract symptoms (luts)   . Esophageal cancer ([place])   . GERD (gastroesophageal reflux disease)   . Glaucoma   . Gout   . History of CVA (cerebrovascular accident)    Cerebral infarction Nov. 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 History of Doppler ultrasound    dopplers revealed mild left ICA stenosis which has remained stable, occluded left subclavian artery with retrograde left vertebral filling and moderately severe right subclavian artery stenosis. he has no symptoms of upper extermity claudication or subclavian steal symptoms.   . History of DVT (deep vein thrombosis)    right arm  . History of stress test 09/02/2009   abnormal myocardial perfusion scan demonstrating an attenuation defect in the inferior region of the myocardium. No ischemia or infarct/scar is seen in the remaining myocardium. No prior study available for comparison. Abnormal myocardial study EF 79%  . Hyperlipemia   . Hypertension   . Macular degeneration   . Pulmonary embolus ([place])   . Stroke ([place])   . Subclavian artery stenosis [facility])     Problem List: Patient Active Problem List   Diagnosis Date Noted  . Dysphagia 12/10/2016  . Malnutrition of moderate degree 02/26/2016  . Pulmonary embolus ([place]) 02/23/2016  . Encounter for antineoplastic chemotherapy  12/19/2015  . Malignant neoplasm of lower third of esophagus ([place]) 12/18/2015  . CKD (chronic kidney disease), stage III 11/15/2015  . History of stroke 11/15/2015  . Bilateral carotid bruits 11/07/2015  . Lacunar infarct, acute ([place]) 04/26/2015  . Cerebral thrombosis with cerebral infarction ([place]) 12/25/2014  . Paresthesias of right arma and leg 12/24/2014  . Renal insufficiency 12/24/2014  . Glaucoma 12/24/2014  . BPH (benign prostatic hyperplasia) 12/24/2014  . Tophaceous gout 12/24/2014  . Subclavian artery stenosis ([place]) 12/03/2012  . Essential hypertension 12/03/2012  . Hyperlipidemia 12/03/2012    Past Surgical History: Past Surgical History:  Procedure Laterality Date  . CATARACT EXTRACTION    . COLONOSCOPY  2002  . ESOPHAGOGASTRODUODENOSCOPY (EGD) WITH PROPOFOL N/A 11/10/2015   Procedure: ESOPHAGOGASTRODUODENOSCOPY (EGD) WITH PROPOFOL;  Surgeon: Manya Silvas, MD;  Location: [facility] ENDOSCOPY;  Service: Endoscopy;  Laterality: N/A;  . ESOPHAGOGASTRODUODENOSCOPY (EGD) WITH PROPOFOL N/A 04/29/2016   Procedure: ESOPHAGOGASTRODUODENOSCOPY (EGD) WITH PROPOFOL;  Surgeon: Manya Silvas, MD;  Location: [facility] ENDOSCOPY;  Service: Endoscopy;  Laterality: N/A;  . ESOPHAGOGASTRODUODENOSCOPY (EGD) WITH PROPOFOL N/A 10/07/2016   Procedure: ESOPHAGOGASTRODUODENOSCOPY (EGD) WITH PROPOFOL;  Surgeon: Manya Silvas, MD;  Location: [facility] ENDOSCOPY;  Service: Endoscopy;  Laterality: N/A;  . FRACTURE SURGERY    . MOHS SURGERY    . PERIPHERAL VASCULAR CATHETERIZATION N/A 11/29/2015   Procedure: Glori Luis Cath Insertion;  Surgeon: Algernon Huxley, MD;  Location: [place] CV LAB;  Service: Cardiovascular;  Laterality: N/A;    Allergies:  Allergies  Allergen Reactions  . Codeine Other (See Comments) and Anaphylaxis    Swollen tongue, numb around mouth, slurred speech Swollen tongue, numb around mouth, slurred speech  . Allopurinol Other (See Comments)    Hand pain  . Simvastatin Other (See  Comments)    myalgias  . Uloric [Febuxostat] Other (See Comments)    Stomach pain    Home Medications: Prescriptions Prior to Admission  Medication Sig Dispense Refill Last Dose  . acetaminophen (TYLENOL) 500 MG tablet Take 500 mg by mouth every 6 (six) hours as needed.   Taking  . amLODipine (NORVASC) 2.5 MG tablet Take 2.5 mg by mouth daily.  0 Taking  . apixaban (ELIQUIS) 5 MG TABS tablet Take 1 tablet (5 mg total) by mouth 2 (two) times daily. Start on 03/03/2016 60 tablet 0 Taking  . atorvastatin (LIPITOR) 20 MG tablet Take 1 tablet (20 mg total) by mouth daily at 6 PM. (Patient taking differently: Take 20 mg by mouth daily with breakfast. ) 30 tablet 1 Taking  . calcium carbonate (TUMS EX) 750 MG chewable tablet Chew 1 tablet by mouth daily.   Taking  . clopidogrel (PLAVIX) 75 MG tablet Take 1 tablet (75 mg total) by mouth daily. 30 tablet 1 Taking  . Coenzyme Q10 (COQ10) 100 MG CAPS Take 100 mg by mouth daily. Reported on 11/10/2015   Taking  . cyanocobalamin 500 MCG tablet Take 500 mcg by mouth daily.   Taking  . dexamethasone (DECADRON) 4 MG tablet Take 8 mg by mouth as directed. 8 mg the day after chemo for 2 days following chemotherapy  0 Not Taking  . dorzolamide (TRUSOPT) 2 % ophthalmic solution Place 1 drop into both eyes 2 (two) times daily.   Taking  . finasteride (PROSCAR) 5 MG tablet Take 5 mg by mouth daily.  10 Taking  . latanoprost (XALATAN) 0.005 % ophthalmic solution Place 1 drop into both eyes at bedtime.   Taking  . lidocaine-prilocaine (EMLA) cream Apply 1 application topically as needed. Apply generously over the Mediport 45 minutes prior to chemotherapy. 30 g 0 Taking  . magnesium oxide (MAG-OX) 400 MG tablet Take 400 mg by mouth daily.   Taking  . metoprolol succinate (TOPROL-XL) 25 MG 24 hr tablet TAKE ONE TABLET (25 MG TOTAL) BY MOUTH DAILY.   Taking  . omeprazole (PRILOSEC) 20 MG capsule Take 20 mg by mouth 2 (two) times daily before a meal.   Taking  .  ondansetron (ZOFRAN) 8 MG tablet Take 1 tablet (8 mg total) by mouth every 8 (eight) hours as needed for nausea or vomiting (start 3 days; after chemo). (Patient not taking: Reported on 12/06/2016) 40 tablet 1 Not Taking  . prochlorperazine (COMPAZINE) 10 MG tablet Take 1 tablet (10 mg total) by mouth every 6 (six) hours as needed for nausea or vomiting. (Patient not taking: Reported on 10/14/2016) 40 tablet 1 Not Taking  . sucralfate (CARAFATE) 1 g tablet Take 1 g by mouth 4 (four) times daily.   Taking  . terazosin (HYTRIN) 10 MG capsule Take 10 mg by mouth at bedtime.  0 Taking  . timolol (TIMOPTIC) 0.5 % ophthalmic solution Place 1 drop into both eyes 2 (two) times daily.  3 Taking  . Vitamin D, Ergocalciferol, (DRISDOL) 50000 UNITS CAPS Take 50,000 Units by mouth every 7 (seven) days.   Taking   Home medication reconciliation was completed with the patient.   Scheduled Inpatient Medications:   . amLODipine  2.5  mg Oral Daily  . [START ON 12/11/2016] atorvastatin  20 mg Oral Q breakfast  . dorzolamide  1 drop Both Eyes BID  . feeding supplement (ENSURE ENLIVE)  237 mL Oral BID BM  . finasteride  5 mg Oral Daily  . latanoprost  1 drop Both Eyes QHS  . magnesium oxide  400 mg Oral Daily  . metoprolol succinate  25 mg Oral Daily  . pantoprazole  40 mg Oral Daily  . sucralfate  1 g Oral TID AC & HS  . timolol  1 drop Both Eyes BID  . cyanocobalamin  500 mcg Oral Daily    Continuous Inpatient Infusions:   . sodium chloride    . heparin      PRN Inpatient Medications:  acetaminophen **OR** acetaminophen, cyclobenzaprine, ondansetron **OR** ondansetron (ZOFRAN) IV  Family History: family history includes Cancer in his sister; Cancer - Colon in his sister; Heart disease in his brother; Other in his brother.  The patient's family history is negative for inflammatory bowel disorders, GI malignancy, or solid organ transplantation.  Social History:   reports that he quit smoking about 23  years ago. His smoking use included Cigarettes. He has a 40.00 pack-year smoking history. He has never used smokeless tobacco. He reports that he does not drink alcohol or use drugs. The patient denies ETOH, tobacco, or drug use.   Review of Systems: Constitutional: Weight is stable.  Eyes: No changes in vision. ENT: No oral lesions, sore throat.  GI: see HPI.  Heme/Lymph: No easy bruising.  CV: No chest pain.  GU: No hematuria.  Integumentary: No rashes.  Neuro: No headaches.  Psych: No depression/anxiety.  Endocrine: No heat/cold intolerance.  Allergic/Immunologic: No urticaria.  Resp: No cough, SOB.  Musculoskeletal: No joint swelling.    Physical Examination: BP (!) 156/55 (BP Location: Right Arm)   Pulse 66   Temp 97.8 F (36.6 C) (Oral)   Resp 20   Ht 6\' 2"  (1.88 m)   Wt 78.4 kg (172 lb 12.8 oz)   SpO2 100%   BMI 22.19 kg/m  Gen: NAD, alert and oriented x 4 HEENT: PEERLA, EOMI, Neck: supple, no JVD or thyromegaly Chest: CTA bilaterally, no wheezes, crackles, or other adventitious sounds CV: RRR, no m/g/c/r Abd: no  guarding, ridigity, or rebound tenderness, no distention, no palpable masses..  Skin: no rash or lesions noted  Data: Lab Results  Component Value Date   WBC 7.3 12/06/2016   HGB 9.9 (L) 12/06/2016   HCT 29.4 (L) 12/06/2016   MCV 94.3 12/06/2016   PLT 264 12/06/2016    Recent Labs Lab 12/06/16 1132  HGB 9.9*   Lab Results  Component Value Date   NA 139 12/06/2016   K 4.4 12/06/2016   CL 107 12/06/2016   CO2 23 12/06/2016   BUN 42 (H) 12/06/2016   CREATININE 1.73 (H) 12/06/2016   Lab Results  Component Value Date   ALT 7 (L) 12/06/2016   AST 17 12/06/2016   ALKPHOS 115 12/06/2016   BILITOT 0.6 12/06/2016   No results for input(s): APTT, INR, PTT in the last 168 hours. Assessment/Plan: Marcus [name] is a 83 y.o. male Who needs a PEG for nourishment due to narrowing of the esophagus likely from tumor. Will do this tomorrow with Dr.  Tamala Julian.  Recommendations:  Thank you for the consult. Please call with questions or concerns.  Gaylyn Cheers, MD

## 2016-12-10 NOTE — Telephone Encounter (Signed)
Discussed with Dr.Kalisetti;  Bridging with IV heparin while patient is off eliquis for PEG tomorrow.

## 2016-12-10 NOTE — Telephone Encounter (Signed)
Spoke to Dr.Kalisetti- who kindly agreed to admit the patient for PEG tube placement. Dr. Vira Agar plans PEG tube placement on 711.   Please inform patient to show up at the admissions desk/ also inform bed management.   Please confirm with the patient that he has NOT been taking- Plavix and Eliquis as instructed.

## 2016-12-10 NOTE — Telephone Encounter (Signed)
Robin please call hospital to get pt admitted

## 2016-12-11 ENCOUNTER — Encounter
Admission: AD | Disposition: A | Discharge status: Home / Self Care | Payer: Self-pay | Source: Ambulatory Visit | Attending: Internal Medicine

## 2016-12-11 ENCOUNTER — Observation Stay: Payer: Medicare Other | Admitting: Anesthesiology

## 2016-12-11 DIAGNOSIS — N183 Chronic kidney disease, stage 3 (moderate): Secondary | ICD-10-CM

## 2016-12-11 DIAGNOSIS — I889 Nonspecific lymphadenitis, unspecified: Secondary | ICD-10-CM

## 2016-12-11 DIAGNOSIS — C7951 Secondary malignant neoplasm of bone: Secondary | ICD-10-CM

## 2016-12-11 DIAGNOSIS — Z86718 Personal history of other venous thrombosis and embolism: Secondary | ICD-10-CM

## 2016-12-11 DIAGNOSIS — E875 Hyperkalemia: Secondary | ICD-10-CM | POA: Diagnosis not present

## 2016-12-11 DIAGNOSIS — K219 Gastro-esophageal reflux disease without esophagitis: Secondary | ICD-10-CM

## 2016-12-11 DIAGNOSIS — H409 Unspecified glaucoma: Secondary | ICD-10-CM

## 2016-12-11 DIAGNOSIS — N400 Enlarged prostate without lower urinary tract symptoms: Secondary | ICD-10-CM

## 2016-12-11 DIAGNOSIS — C155 Malignant neoplasm of lower third of esophagus: Secondary | ICD-10-CM

## 2016-12-11 DIAGNOSIS — R630 Anorexia: Secondary | ICD-10-CM | POA: Diagnosis not present

## 2016-12-11 DIAGNOSIS — M109 Gout, unspecified: Secondary | ICD-10-CM

## 2016-12-11 DIAGNOSIS — Z86711 Personal history of pulmonary embolism: Secondary | ICD-10-CM

## 2016-12-11 DIAGNOSIS — C159 Malignant neoplasm of esophagus, unspecified: Secondary | ICD-10-CM | POA: Diagnosis not present

## 2016-12-11 DIAGNOSIS — I129 Hypertensive chronic kidney disease with stage 1 through stage 4 chronic kidney disease, or unspecified chronic kidney disease: Secondary | ICD-10-CM

## 2016-12-11 DIAGNOSIS — R1310 Dysphagia, unspecified: Secondary | ICD-10-CM

## 2016-12-11 DIAGNOSIS — Z87891 Personal history of nicotine dependence: Secondary | ICD-10-CM

## 2016-12-11 DIAGNOSIS — Z7901 Long term (current) use of anticoagulants: Secondary | ICD-10-CM | POA: Diagnosis not present

## 2016-12-11 DIAGNOSIS — Z8673 Personal history of transient ischemic attack (TIA), and cerebral infarction without residual deficits: Secondary | ICD-10-CM

## 2016-12-11 DIAGNOSIS — Z9221 Personal history of antineoplastic chemotherapy: Secondary | ICD-10-CM | POA: Diagnosis not present

## 2016-12-11 DIAGNOSIS — H3530 Unspecified macular degeneration: Secondary | ICD-10-CM

## 2016-12-11 HISTORY — PX: PEG PLACEMENT: SHX5437

## 2016-12-11 HISTORY — PX: ESOPHAGOGASTRODUODENOSCOPY (EGD) WITH PROPOFOL: SHX5813

## 2016-12-11 LAB — BASIC METABOLIC PANEL
Anion gap: 6 (ref 5–15)
BUN: 36 mg/dL — ABNORMAL HIGH (ref 6–20)
CALCIUM: 8.5 mg/dL — AB (ref 8.9–10.3)
CO2: 24 mmol/L (ref 22–32)
CREATININE: 1.35 mg/dL — AB (ref 0.61–1.24)
Chloride: 110 mmol/L (ref 101–111)
GFR calc Af Amer: 54 mL/min — ABNORMAL LOW (ref >60)
GFR calc non Af Amer: 47 mL/min — ABNORMAL LOW (ref >60)
GLUCOSE: 90 mg/dL (ref 65–99)
Potassium: 3.9 mmol/L (ref 3.5–5.1)
Sodium: 140 mmol/L (ref 135–145)

## 2016-12-11 LAB — CBC
HCT: 25.5 % — ABNORMAL LOW (ref 40.0–52.0)
Hemoglobin: 8.7 g/dL — ABNORMAL LOW (ref 13.0–18.0)
MCH: 31.9 pg (ref 26.0–34.0)
MCHC: 34.2 g/dL (ref 32.0–36.0)
MCV: 93.4 fL (ref 80.0–100.0)
PLATELETS: 219 10*3/uL (ref 150–440)
RBC: 2.72 MIL/uL — ABNORMAL LOW (ref 4.40–5.90)
RDW: 14.6 % — AB (ref 11.5–14.5)
WBC: 6.2 10*3/uL (ref 3.8–10.6)

## 2016-12-11 LAB — HEPARIN LEVEL (UNFRACTIONATED): Heparin Unfractionated: 1.13 IU/mL — ABNORMAL HIGH (ref 0.30–0.70)

## 2016-12-11 LAB — APTT: aPTT: 79 s — ABNORMAL HIGH (ref 24–36)

## 2016-12-11 SURGERY — INSERTION, PEG TUBE
Anesthesia: General

## 2016-12-11 MED ORDER — MIDAZOLAM HCL 2 MG/2ML IJ SOLN
INTRAMUSCULAR | Status: DC | PRN
  Administered 2016-12-11: 1 mg via INTRAVENOUS

## 2016-12-11 MED ORDER — JEVITY 1.2 CAL PO LIQD: 1000.0000 mL | ORAL | Status: DC

## 2016-12-11 MED ORDER — PHENYLEPHRINE HCL 10 MG/ML IJ SOLN
INTRAMUSCULAR | Status: DC | PRN
  Administered 2016-12-11 (×4): 100 ug via INTRAVENOUS

## 2016-12-11 MED ORDER — PRO-STAT SUGAR FREE PO LIQD
30.0000 mL | Freq: Every day | ORAL | Status: DC
  Administered 2016-12-12: 30 mL

## 2016-12-11 MED ORDER — ACETAMINOPHEN 650 MG RE SUPP: 650.0000 mg | RECTAL | Status: DC | PRN

## 2016-12-11 MED ORDER — ROCURONIUM BROMIDE 100 MG/10ML IV SOLN: INTRAVENOUS | Status: DC | PRN

## 2016-12-11 MED ORDER — HEPARIN (PORCINE) IN NACL 100-0.45 UNIT/ML-% IJ SOLN
1250.0000 [IU]/h | INTRAMUSCULAR | Status: DC
  Administered 2016-12-11: 19:00:00 1250 [IU]/h via INTRAVENOUS
  Filled 2016-12-11: qty 250

## 2016-12-11 MED ORDER — GLYCOPYRROLATE 0.2 MG/ML IJ SOLN
INTRAMUSCULAR | Status: DC | PRN
  Administered 2016-12-11: 0.2 mg via INTRAVENOUS

## 2016-12-11 MED ORDER — LIDOCAINE HCL (CARDIAC) 20 MG/ML IV SOLN
INTRAVENOUS | Status: DC | PRN
  Administered 2016-12-11: 30 mg via INTRAVENOUS

## 2016-12-11 MED ORDER — TAPENTADOL HCL 50 MG PO TABS
50.0000 mg | ORAL_TABLET | Freq: Four times a day (QID) | ORAL | Status: DC | PRN
  Administered 2016-12-11 (×2): 50 mg
  Filled 2016-12-11 (×2): qty 1

## 2016-12-11 MED ORDER — PROPOFOL 500 MG/50ML IV EMUL
INTRAVENOUS | Status: DC | PRN
  Administered 2016-12-11: 110 ug/kg/min via INTRAVENOUS

## 2016-12-11 MED ORDER — PROPOFOL 10 MG/ML IV BOLUS
INTRAVENOUS | Status: DC | PRN
  Administered 2016-12-11: 60 mg via INTRAVENOUS

## 2016-12-11 MED ORDER — FENTANYL CITRATE (PF) 100 MCG/2ML IJ SOLN
INTRAMUSCULAR | Status: DC | PRN
  Administered 2016-12-11: 50 ug via INTRAVENOUS

## 2016-12-11 MED ORDER — JEVITY 1.5 CAL/FIBER PO LIQD: 1000.0000 mL | ORAL | Status: DC

## 2016-12-11 NOTE — Care Management (Signed)
Admitted to this facility under observation status with the diagnosis of dysphagia. Lives with wife, Marcus [name] [phone]). Last seen Dr. Melford Aase 10/11/16. Prescriptions are filled at [facility] on [address]. No home health. No skilled facility. No home oxygen. Takes care of all basic activities of daily living himself, drives. Lost 15 pound in the last 6 weeks. 49 pounds total since being diagnosed with esophageal cancer. Family will transport NPO. Scheduled for PEG placement. Agreeable for Deputy for services in the home. Shelbie Ammons RN MSN CCM Care Management [phone]

## 2016-12-11 NOTE — Op Note (Signed)
[facility] Gastroenterology Patient Name: Marcus [name] Procedure Date: 12/11/2016 12:42 PM MRN: [id number] Account #: [id number] Date of Birth: Dec 08, 1931 Admit Type: Inpatient Age: 83 Room: [facility] ENDO ROOM 3 Gender: Male Note Status: Finalized Procedure:            Upper GI endoscopy Indications:          Place PEG because patient is unable to eat, Place PEG                        due to feeding difficulties secondary to esophageal                        tumor Providers:            Gaylyn Cheers, MD, Manya Silvas, MD Referring MD:         No Local Md, MD (Referring MD) Medicines:            Propofol per Anesthesia Complications:        No immediate complications. Procedure:            Pre-Anesthesia Assessment:                       - After reviewing the risks and benefits, the patient                        was deemed in satisfactory condition to undergo the                        procedure.                       After obtaining informed consent, the endoscope was                        passed under direct vision. Throughout the procedure,                        the patient's blood pressure, pulse, and oxygen                        saturations were monitored continuously. The Endoscope                        was introduced through the mouth, and advanced to the                        duodenal bulb. The upper GI endoscopy was accomplished                        without difficulty. The patient tolerated the procedure                        well. Findings:      One severe (stenosis; an endoscope cannot pass) malignant-appearing,       intrinsic stenosis was found 40 cm from the incisors. This measured 2 cm       (in length) and was traversed.Liquid and mushy food suctioned out of       esophagus and with gentle pressure I was able to pass the scope into the  stomach and was able to shine the light through the abd wall so that a       PEG could be  done by the surgeon using 4 T wire fasteners. No neoplasm       seen in stomach. Impression:           - Malignant-appearing esophageal stenosis.                       - No specimens collected. Recommendation:       - Please follow the post-PEG recommendations. Get                        dietary consult. Manya Silvas, MD 12/11/2016 1:53:14 PM This report has been signed electronically. Number of Addenda: 0 Note Initiated On: 12/11/2016 12:42 PM      [facility]

## 2016-12-11 NOTE — Anesthesia Preprocedure Evaluation (Signed)
Anesthesia Evaluation  Patient identified by MRN, date of birth, ID band Patient awake    Reviewed: Allergy & Precautions, NPO status , Patient's Chart, lab work & pertinent test results  Airway Mallampati: II       Dental  (+) Upper Dentures   Pulmonary COPD, former smoker,     + decreased breath sounds      Cardiovascular Exercise Tolerance: Good hypertension, Pt. on medications + Peripheral Vascular Disease   Rhythm:Regular     Neuro/Psych CVA    GI/Hepatic Neg liver ROS, GERD  Medicated,  Endo/Other    Renal/GU      Musculoskeletal   Abdominal (+) + obese,   Peds  Hematology negative hematology ROS (+)   Anesthesia Other Findings   Reproductive/Obstetrics                             Anesthesia Physical Anesthesia Plan  ASA: III  Anesthesia Plan: General   Post-op Pain Management:    Induction: Intravenous  PONV Risk Score and Plan: 0  Airway Management Planned: Natural Airway and Nasal Cannula  Additional Equipment:   Intra-op Plan:   Post-operative Plan:   Informed Consent: I have reviewed the patients History and Physical, chart, labs and discussed the procedure including the risks, benefits and alternatives for the proposed anesthesia with the patient or authorized representative who has indicated his/her understanding and acceptance.     Plan Discussed with: CRNA  Anesthesia Plan Comments:         Anesthesia Quick Evaluation

## 2016-12-11 NOTE — Care Management Obs Status (Signed)
[facility] NOTIFICATION   Patient Details  Name: Marcus [name] MRN: [id number] Date of Birth: 1932/02/03   Medicare Observation Status Notification Given:  Yes    Shelbie Ammons, RN 12/11/2016, 9:33 AM

## 2016-12-11 NOTE — Progress Notes (Addendum)
Initial Nutrition Assessment  DOCUMENTATION CODES:   Non-severe (moderate) malnutrition in context of chronic illness  INTERVENTION:   -D/c Ensure Enlive po BID, each supplement provides 350 kcal and 20 grams of protein -Initiate Jevity 1.5 @ 20 ml/hr via PEG and increase by 10 ml every 12 hours to goal rate of 50 ml/hr.   30 ml Prostat (or equivalent) daily.    Tube feeding regimen provides 1900 kcal (100% of needs), 91 grams of protein, and 912 ml of H2O.   If no IVFS, recommend 175 ml free water flush 4 times daily.   -If pt desire bolus feedings, recommend:  Initiate bolus feedings of 300 ml of Jevity 1.5 QID via PEG  30 ml Prostat (or equivalent) daily.    Tube feeding regimen provides 1900 kcal (100% of needs), 91 grams of protein, and 912 ml of H2O.   Recommend 75 ml free water flush before and after each feeding administration  -Due to malnutrition and prolonged poor po intake, pt is at high risk for refeeding syndrome. Recommend checking K, Mg, and Phos daily x 3 days, replete as needed, and monitor for signs of refeeding.   NUTRITION DIAGNOSIS:   Malnutrition (Mild) related to chronic illness (esophageal cancer) as evidenced by energy intake < 75% for > or equal to 1 month, mild depletion of body fat, moderate depletion of body fat, mild depletion of muscle mass, moderate depletions of muscle mass, percent weight loss.  GOAL:   Patient will meet greater than or equal to 90% of their needs  MONITOR:   PO intake, Diet advancement, Labs, Weight trends, Skin, I & O's  REASON FOR ASSESSMENT:   Malnutrition Screening Tool    ASSESSMENT:   Marcus [name]  is a 83 y.o. male with a known history of Esophageal adenocarcinoma status post chemoradiation, history of DVT and PE on eliquis, history of CVA with minimal right-sided paresthesias residual, BPH, macular degeneration presents to hospital secondary to worsening dysphagia and need for PEG tube.  Spoke with pt  at wife at bedside. Both report an extreme decline in intake over the past 3 weeks. Pt shares that he is hungry, however, unable to tolerate intake ("everything that I eat comes right back up when I try to swallow"). Prior to three weeks ago, pt reports very good appetite, consuming 3 meals and one snack per day ("I was eating anything and everything that I wanted").   Pt endorses a 15# wt loss over the past 3 weeks, related to very minimal PO intake. Pt wife also shares that pt has lost approximately 50# over the past year, due to chemotherapy and radiation treatments, which ended in October 2017. Per Care Everywhere records, pt weighed 195# on 09/16/16 encounter, indicative of 23# (11.8%) wt loss over the past 3 months, which is significant for time frame.   Nutrition-Focused physical exam completed. Findings are mild to moderate fat depletion, mild to moderate muscle depletion, and no edema. Pt and wife endorse muscle loss and weight loss based upon appearance. Pt describes himself as a "couch potato" and ambulates with a cane outside the home.   Pt confirms plan to place PEG today. Discussed with pt and wife how pt would receive nutrition once PEG was placed. They had no further questions, however, were optimistic that nutritional status would improve once TF was initiated.    ADDENDUM: Received consult for tube feeding initiation and management. Per MD, plan to start feedings tomorrow.   Labs reviewed: CBGS:  102.   Diet Order:  Diet NPO time specified  Skin:  Reviewed, no issues  Last BM:  12/10/16  Height:   Ht Readings from Last 1 Encounters:  12/10/16 6\' 2"  (1.88 m)    Weight:   Wt Readings from Last 1 Encounters:  12/10/16 172 lb 12.8 oz (78.4 kg)    Ideal Body Weight:  80.9 kg  BMI:  Body mass index is 22.19 kg/m.  Estimated Nutritional Needs:   Kcal:  1700-1900  Protein:  85-100 grams  Fluid:  1.7-1.9 L  EDUCATION NEEDS:   Education needs addressed  Marcus Truax A.  Jimmye [name], RD, LDN, CDE Pager: [phone] After hours Pager: [phone]

## 2016-12-11 NOTE — Anesthesia Postprocedure Evaluation (Signed)
Anesthesia Post Note  Patient: Marcus [name]  Procedure(s) Performed: Procedure(s) (LRB): PERCUTANEOUS ENDOSCOPIC GASTROSTOMY (PEG) PLACEMENT (N/A) ESOPHAGOGASTRODUODENOSCOPY (EGD) WITH PROPOFOL (N/A)  Patient location during evaluation: PACU Anesthesia Type: General Level of consciousness: awake Pain management: pain level controlled Vital Signs Assessment: post-procedure vital signs reviewed and stable Respiratory status: spontaneous breathing Cardiovascular status: stable Anesthetic complications: no     Last Vitals:  Vitals:   12/11/16 1355 12/11/16 1405  BP: (!) 131/58 (!) 147/59  Pulse: 70 71  Resp:  17  Temp: (!) 36.2 C     Last Pain:  Vitals:   12/11/16 1355  TempSrc: Tympanic  PainSc:                  VAN STAVEREN,Dhaval Woo

## 2016-12-11 NOTE — Progress Notes (Signed)
ANTICOAGULATION CONSULT NOTE - Initial Consult  Pharmacy Consult for Heparin dosing and monitoring  Indication: VTE prophylaxis  Allergies  Allergen Reactions  . Codeine Other (See Comments) and Anaphylaxis    Swollen tongue, numb around mouth, slurred speech Swollen tongue, numb around mouth, slurred speech  . Allopurinol Other (See Comments)    Hand pain  . Simvastatin Other (See Comments)    myalgias  . Uloric [Febuxostat] Other (See Comments)    Stomach pain    Patient Measurements: Height: 6\' 2"  (188 cm) Weight: 172 lb 12.8 oz (78.4 kg) IBW/kg (Calculated) : 82.2 Heparin Dosing Weight: 78.4  Vital Signs: Temp: 97.5 F (36.4 C) (07/11 1454) Temp Source: Oral (07/11 1454) BP: 155/53 (07/11 1454) Pulse Rate: 64 (07/11 1454)  Labs:  Recent Labs  12/10/16 1659 12/11/16 0111  HGB 9.7* 8.7*  HCT 28.7* 25.5*  PLT 229 219  APTT 32 79*  LABPROT 15.8*  --   INR 1.25  --   HEPARINUNFRC 1.00* 1.13*  CREATININE 1.32* 1.35*    Estimated Creatinine Clearance: 46 mL/min (A) (by C-G formula based on SCr of 1.35 mg/dL (H)).   Medical History: Past Medical History:  Diagnosis Date  . Aphakia of right eye   . Arthritis   . Cancer ([place])    SKIN  . Enlarged prostate without lower urinary tract symptoms (luts)   . Esophageal cancer ([place])   . GERD (gastroesophageal reflux disease)   . Glaucoma   . Gout   . History of CVA (cerebrovascular accident)    Cerebral infarction Nov. 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 History of Doppler ultrasound    dopplers revealed mild left ICA stenosis which has remained stable, occluded left subclavian artery with retrograde left vertebral filling and moderately severe right subclavian artery stenosis. he has no symptoms of upper extermity claudication or subclavian steal symptoms.   . History of DVT (deep vein thrombosis)    right arm  . History of stress test 09/02/2009   abnormal myocardial perfusion scan demonstrating  an attenuation defect in the inferior region of the myocardium. No ischemia or infarct/scar is seen in the remaining myocardium. No prior study available for comparison. Abnormal myocardial study EF 79%  . Hyperlipemia   . Hypertension   . Macular degeneration   . Pulmonary embolus ([place])   . Stroke ([place])   . Subclavian artery stenosis ([facility])     Medications:  Apixaban 5mg  twice daily  Assessment: 83 yo male with history of VTE who takes Apixaban 5mg  twice daily at home. Patient is to undergo EGD on 7/11. Last apixaban dose reported on 7/8 AM.  Instructions were given by MD to stop heparin 6 hours prior to procedure on 7/11.   7/11 1800 MD (Dr. Tamala Julian) wishes to restart heparin drip this evening.   Goal of Therapy:  APPT: 66-102s Heparin level 0.3-0.7 units/ml Monitor platelets by anticoagulation protocol: Yes   Plan: Restart heparin infusion at 1250 units/hr Will need to dose by APTTs until heparin level and APTTs correlate Check APTT and anti-Xa level in 8 hours and daily while on heparin Continue to monitor H&H and platelets CBC with AM labs per protocol    Pernell Dupre, PharmD, BCPS Clinical Pharmacist 12/11/2016 5:56 PM

## 2016-12-11 NOTE — Progress Notes (Signed)
The percutaneous endoscopic gastrostomy was done earlier today. The patient reports minimal discomfort at the site. There is some minimal amount of bilious drainage from the gastrostomy tube into the straight drainage bag. The dressing is dry. Abdomen is soft and nontender.  Impression satisfactory progress after her percutaneous endoscopic gastrostomy.  Can remove the biliary drainage bag tomorrow morning and begin infusion of Jevity as advised by dietitian.  Will write order to resume heparin drip.

## 2016-12-11 NOTE — Transfer of Care (Signed)
Immediate Anesthesia Transfer of Care Note  Patient: Marcus [name]  Procedure(s) Performed: Procedure(s): PERCUTANEOUS ENDOSCOPIC GASTROSTOMY (PEG) PLACEMENT (N/A) ESOPHAGOGASTRODUODENOSCOPY (EGD) WITH PROPOFOL (N/A)  Patient Location:   Anesthesia Type:General  Level of Consciousness: drowsy  Airway & Oxygen Therapy: Patient Spontanous Breathing and Patient connected to face mask oxygen  Post-op Assessment: Report given to RN and Post -op Vital signs reviewed and stable  Post vital signs: Reviewed and stable  Last Vitals:  Vitals:   12/11/16 0408 12/11/16 0821  BP: 126/60 (!) 133/49  Pulse: 63 62  Resp: 14 16  Temp: 37 C 37.4 C    Last Pain:  Vitals:   12/11/16 0848  TempSrc:   PainSc: 0-No pain         Complications: No apparent anesthesia complications

## 2016-12-11 NOTE — Op Note (Signed)
OPERATIVE REPORT  PREOPERATIVE  DIAGNOSIS: . Esophageal cancer with dysphasia  POSTOPERATIVE DIAGNOSIS: . Esophageal cancer with dysphasia  PROCEDURE: . Percutaneous endoscopic gastrostomy  ANESTHESIA:  General, local 1% Xylocaine with epinephrine  SURGEON: Rochel Brome  MD   INDICATIONS: . History of esophageal cancer with recent development of severe dysphasia and weight loss.  With the patient on his bed in the left lateral decubitus position under anesthetized and monitored by anesthesia Dr. Vira Agar inserted the endoscope. He has created an endoscopy report describing his findings. There was some narrowing of the distal esophagus and old food within the esophagus. It was possible to advance the endoscope into the stomach which had a smooth mucosa. The patient was then turned to the supine position. The light of the endoscope could be seen shining through the abdominal wall in the left upper quadrant. I pressed in with the index finger at the site and could see the endoscopic view of the imprint of the finger. The abdomen was prepared with ChloraPrep and draped in a sterile manner. The skin at the determined site in the left upper quadrant was then infiltrated with 1% Xylocaine with epinephrine. The Halyard 18 French gastrostomy introducer kit was used. The kit was opened after seeing that the stomach was adjacent to the abdominal wall.  4 T-fasteners were inserted into the stomach and traction applied and the suture buttons activated. The introducer kit did not contain a guidewire. Therefore a second kit was opened to obtain a guidewire. An 8 mm incision was made in the middle of the 4 T-fasteners. The Seldinger needle was introduced into the stomach. The guidewire was inserted. The needle was withdrawn. The dilator was advanced over the guidewire and serially dilated the gastrostomy opening of the abdominal wall. The dilators and guidewire were removed. The introducer kit did not contain an 77  French gastrostomy tube. Therefore a tube was removed from a peel pack. This was advanced through the introducer sheath and the sheath was peeled away. The balloon was inflated with 20 cc of water. Traction was applied as the balloon was pulled up adjacent to the abdominal wall. The accompanying disc was advanced down to the abdominal wall. The carbon dioxide was a aspirated from the stomach and the endoscope was removed. The disc was attached to the skin with 3-0 nylon sutures with 4 point fixation. Gauze dressings were applied with paper tape. The tube was attached to a biliary drainage bag.  The patient did have some hypoxia during the course of the general anesthesia which was managed by the anesthesia staff and resolved. After the procedure the patient was carried to the recovery room for postoperative care.  Rochel Brome M.D.

## 2016-12-11 NOTE — Progress Notes (Signed)
Patient ID: Marcus [name], male   DOB: 1931/10/08, 83 y.o.   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 Patient admitted with dysphagia and PEG placement.  I discussed CODE STATUS on what to do in the emergency situation if his heart were to stop or his breathing were to get bad. He would like to remain a full code at this time.  I told him that any treatments that the oncologist were to offer will be palliative in nature trying to slow things down.  Patient would still like to be full code at this time  Time spent on ACP discussion 17 minutes  Dr. Loletha Grayer

## 2016-12-11 NOTE — Progress Notes (Signed)
Patient ID: Marcus [name], male   DOB: [birth year], 83 y.o.   MRN: [id number]  [facility] PROGRESS NOTE  Marcus [name] IEP:[id number] DOB: [birth year] DOA: 12/10/2016 PCP: Chesley Noon, MD  HPI/Subjective: Patient has been having weight loss. Recent symptoms of unable to swallow. PET scan yesterday showed metastatic esophageal cancer.  Objective: Vitals:   12/11/16 1425 12/11/16 1454  BP: (!) 144/74 (!) 155/53  Pulse: 72 64  Resp: (!) 21 18  Temp:  (!) 97.5 F (36.4 C)    Filed Weights   12/10/16 1518  Weight: 78.4 kg (172 lb 12.8 oz)    ROS: Review of Systems  Constitutional: Negative for chills and fever.  Eyes: Negative for blurred vision.  Respiratory: Negative for cough and shortness of breath.   Cardiovascular: Negative for chest pain.  Gastrointestinal: Negative for abdominal pain, constipation, diarrhea, nausea and vomiting.  Genitourinary: Negative for dysuria.  Musculoskeletal: Negative for joint pain.  Neurological: Negative for dizziness and headaches.   Exam: Physical Exam  HENT:  Nose: No mucosal edema.  Mouth/Throat: No oropharyngeal exudate or posterior oropharyngeal edema.  Eyes: Conjunctivae, EOM and lids are normal. Pupils are equal, round, and reactive to light.  Neck: No JVD present. Carotid bruit is not present. No edema present. No thyroid mass and no thyromegaly present.  Cardiovascular: S1 normal and S2 normal.  Exam reveals no gallop.   No murmur heard. Pulses:      Dorsalis pedis pulses are 2+ on the right side, and 2+ on the left side.  Respiratory: No respiratory distress. He has no wheezes. He has no rhonchi. He has no rales.  GI: Soft. Bowel sounds are normal. There is no tenderness.  Musculoskeletal:       Right ankle: He exhibits no swelling.       Left ankle: He exhibits no swelling.  Lymphadenopathy:    He has no cervical adenopathy.  Neurological: He is alert. No cranial nerve deficit.  Skin: Skin is warm. No rash  noted. Nails show no clubbing.  Psychiatric: He has a normal mood and affect.      Data Reviewed: Basic Metabolic Panel:  Recent Labs Lab 12/06/16 1132 12/10/16 1659 12/11/16 0111  NA 139 140 140  K 4.4 3.8 3.9  CL 107 109 110  CO2 23 21* 24  GLUCOSE 125* 112* 90  BUN 42* 39* 36*  CREATININE 1.73* 1.32* 1.35*  CALCIUM 9.1 8.6* 8.5*   Liver Function Tests:  Recent Labs Lab 12/06/16 1132 12/10/16 1659  AST 17 15  ALT 7* 9*  ALKPHOS 115 120  BILITOT 0.6 0.5  PROT 7.9 7.0  ALBUMIN 3.4* 2.9*   CBC:  Recent Labs Lab 12/06/16 1132 12/10/16 1659 12/11/16 0111  WBC 7.3 6.2 6.2  NEUTROABS 6.0  --   --   HGB 9.9* 9.7* 8.7*  HCT 29.4* 28.7* 25.5*  MCV 94.3 94.0 93.4  PLT 264 229 219    CBG:  Recent Labs Lab 12/10/16 1238  GLUCAP 102*     Studies: Dg Chest 2 View  Result Date: 12/10/2016 CLINICAL DATA:  Cough and chest pain. EXAM: CHEST  2 VIEW COMPARISON:  PET CT earlier this day FINDINGS: Tip of the right chest port in the mid SVC. Normal heart size with atherosclerosis and calcification of the thoracic aorta. Dilated esophagus on PET is not confidently seen radiographically, suggestion of air-filled structure posterior to the trachea on the lateral view likely correlates. Pulmonary nodules on PET  CT not well seen radiographically. Trace bilateral pleural effusions. The bones are under mineralized. Bone lesions in the left fourth rib and bilateral scapula are not seen. IMPRESSION: 1. Trace pleural effusions. Lung nodules on CT are not visualized radiographically. 2. Dense aortic atherosclerosis. 3. Air distended esophagus corresponding to findings on PET/CT. Electronically Signed   By: Jeb Levering M.D.   On: 12/10/2016 20:08   Nm Pet Image Restag (ps) Skull Base To Thigh  Result Date: 12/10/2016 CLINICAL DATA:  Subsequent treatment strategy for distal esophageal neoplasm. EXAM: NUCLEAR MEDICINE PET SKULL BASE TO THIGH TECHNIQUE: 12.8 mCi F-18 FDG was  injected intravenously. Full-ring PET imaging was performed from the skull base to thigh after the radiotracer. CT data was obtained and used for attenuation correction and anatomic localization. FASTING BLOOD GLUCOSE:  Value: 102 mg/dl COMPARISON:  PET-CT 09/04/2016 FINDINGS: NECK No hypermetabolic lymph nodes in the neck. Focus of uptake in the midline cerebellum is nonspecific. CHEST Intense metabolic activity through the GE junction is increased with SUV max equal 15.2 new increased from 11.0. There is new fluid retained within the esophagus. Lobular nodule in the LEFT lower lobe is unchanged in size at 16 mm. No significant metabolic activity. Small effusion. ABDOMEN/PELVIS No hypermetabolic activity in the liver. No hypermetabolic gastrohepatic ligament lymph nodes. No hypermetabolic abdominopelvic lymph nodes. SKELETON There is new hypermetabolic activity associated with a lytic lesion in the lateral aspect of the LEFT fourth rib with SUV max equal 9.3. A similar lytic lesion in the LEFT scapula/glenoid fossa and RIGHT body of the scapula. Focus of activity in the LEFT gluteus muscle is indeterminate. IMPRESSION: 1. 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 2. Increased in metabolic activity at the GE junction lesion. There is new fluid retention within the esophagus. 3. Focal activity in the midline cerebellum is indeterminate. Recommend MRI brain with contrast to exclude brain metastasis. 4. Potential soft tissue metastasis to the LEFT gluteus muscle. Indeterminate. 5. Stable pulmonary nodules without metabolic activity. Electronically Signed   By: Suzy Bouchard M.D.   On: 12/10/2016 17:38    Scheduled Meds: . amLODipine  2.5 mg Oral Daily  . atorvastatin  20 mg Oral Q breakfast  . dorzolamide  1 drop Both Eyes BID  . [START ON 12/12/2016] feeding supplement (PRO-STAT SUGAR FREE 64)  30 mL Per Tube Daily  . finasteride  5 mg Oral Daily  . latanoprost   1 drop Both Eyes QHS  . magnesium oxide  400 mg Oral Daily  . metoprolol succinate  25 mg Oral Daily  . pantoprazole  40 mg Oral Daily  . sucralfate  1 g Oral TID AC & HS  . timolol  1 drop Both Eyes BID  . cyanocobalamin  500 mcg Oral Daily   Continuous Infusions: . sodium chloride 75 mL/hr at 12/11/16 0625  . [START ON 12/12/2016] feeding supplement (JEVITY 1.5 CAL/FIBER)      Assessment/Plan:  1. 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 PEG placement by GI and surgery today. Pain after procedure requiring Nucynta. Patient is a full code. Please see ACP note. Will follow-up with oncology. 2. History of DVT and pulmonary embolism with Eliquis on hold for procedure 3. History of stroke with Plavix on hold for procedure 4. Essential hypertension on Norvasc and metoprolol 5. Glaucoma on timolol 6. Hyperlipidemia unspecified on atorvastatin 7. Anemia of chronic disease follow-up as outpatient 8. Chronic kidney disease stage III 9. BPH on  finasteride  Code Status:     Code Status Orders        Start     Ordered   12/10/16 1511  Full code  Continuous     12/10/16 1511    Code Status History    Date Active Date Inactive Code Status Order ID Comments User Context   02/23/2016 10:36 PM 02/26/2016  3:22 PM Full Code [id number]  Toy Baker, MD Inpatient   12/24/2014  6:24 PM 12/26/2014  7:01 PM Full Code [id number]  Delfina Redwood, MD Inpatient    Advance Directive Documentation     Most Recent Value  Type of Advance Directive  Healthcare Power of Attorney, Living will  Pre-existing out of facility DNR order (yellow form or pink MOST form)  -  "MOST" Form in Place?  -     Family Communication: Wife at the bedside Disposition Plan: Potentially start back anticoagulation and PEG tube feeding and learning tomorrow  Consultants:  Gastroenterology  Surgery  Procedures:  PEG placement  Time spent: 35 minutes including ACP time  Loletha Grayer  [employer]

## 2016-12-11 NOTE — Progress Notes (Signed)
ANTICOAGULATION CONSULT NOTE - Initial Consult  Pharmacy Consult for Heparin dosing and monitoring  Indication: VTE prophylaxis/bridging   Allergies  Allergen Reactions  . Codeine Other (See Comments) and Anaphylaxis    Swollen tongue, numb around mouth, slurred speech Swollen tongue, numb around mouth, slurred speech  . Allopurinol Other (See Comments)    Hand pain  . Simvastatin Other (See Comments)    myalgias  . Uloric [Febuxostat] Other (See Comments)    Stomach pain    Patient Measurements: Height: 6\' 2"  (188 cm) Weight: 172 lb 12.8 oz (78.4 kg) IBW/kg (Calculated) : 82.2 Heparin Dosing Weight: 78.4  Vital Signs: Temp: 97.6 F (36.4 C) (07/10 1954) Temp Source: Oral (07/10 1954) BP: 125/53 (07/10 1954) Pulse Rate: 68 (07/10 1954)  Labs:  Recent Labs  12/10/16 1659 12/11/16 0111  HGB 9.7* 8.7*  HCT 28.7* 25.5*  PLT 229 219  APTT 32 79*  LABPROT 15.8*  --   INR 1.25  --   HEPARINUNFRC 1.00* 1.13*  CREATININE 1.32* 1.35*    Estimated Creatinine Clearance: 46 mL/min (A) (by C-G formula based on SCr of 1.35 mg/dL (H)).   Medical History: Past Medical History:  Diagnosis Date  . Aphakia of right eye   . Arthritis   . Cancer ([place])    SKIN  . Enlarged prostate without lower urinary tract symptoms (luts)   . Esophageal cancer ([place])   . GERD (gastroesophageal reflux disease)   . Glaucoma   . Gout   . History of CVA (cerebrovascular accident)    Cerebral infarction Nov. 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 History of Doppler ultrasound    dopplers revealed mild left ICA stenosis which has remained stable, occluded left subclavian artery with retrograde left vertebral filling and moderately severe right subclavian artery stenosis. he has no symptoms of upper extermity claudication or subclavian steal symptoms.   . History of DVT (deep vein thrombosis)    right arm  . History of stress test 09/02/2009   abnormal myocardial perfusion scan  demonstrating an attenuation defect in the inferior region of the myocardium. No ischemia or infarct/scar is seen in the remaining myocardium. No prior study available for comparison. Abnormal myocardial study EF 79%  . Hyperlipemia   . Hypertension   . Macular degeneration   . Pulmonary embolus ([place])   . Stroke ([place])   . Subclavian artery stenosis ([facility])     Medications:  Apixaban 5mg  twice daily  Assessment: 82 yo male with history of VTE who takes Apixaban 5mg  twice daily at home. Patient is to undergo EGD on 7/11. Last apixaban dose reported on 7/8 AM.  Instructions were given by MD to stop heparin 6 hours prior to procedure on 7/11.   Goal of Therapy:  APPT: 66-102s Heparin level 0.3-0.7 units/ml Monitor platelets by anticoagulation protocol: Yes   Plan: Baseline labs ordered No bolus  Start heparin infusion at 1250 units/hr **Will need to dose by APTTs until heparin level and APTTs correlate Check APTT and anti-Xa level in 8 hours and daily while on heparin Continue to monitor H&H and platelets  Dr. Tamala Julian wants drip to stop at 0400 on 07/11  7/11 0100 aPTT 79, heparin level 1.13. No change to current regimen. Plan to d/c at 0400 per MD.   Eloise Harman, PharmD, BCPS Clinical Pharmacist 12/11/2016 3:32 AM

## 2016-12-11 NOTE — Anesthesia Post-op Follow-up Note (Cosign Needed)
Anesthesia QCDR form completed.        

## 2016-12-11 NOTE — Consult Note (Signed)
[facility] NOTE  Patient Care Team: Chesley Noon, MD as PCP - General (Family Medicine) Manya Silvas, MD as Consulting Physician (Gastroenterology)  CHIEF COMPLAINTS/PURPOSE OF CONSULTATION:  Esophagus cancer  HISTORY OF PRESENTING ILLNESS:  Marcus [name] 83 y.o.  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 Patient more recently had a PET scan 2 months ago that was suspicious for local recurrence. However after EGD/biopsy- this was not conclusively proven. Patient was given the option of further workup/including EUS- which he declined at that time as he was asymptomatic.  However more recently patient noted to have worsening by mouth intake/difficulty swallowing; also noted to have left chest wall pain. Patient was subsequently admitted to the hospital for PEG tube placement. He also had a PET scan prior to admission.  Patient underwent PEG tube placement today. Procedure has been uneventful.   Complains of pain mostly in his left rib. Feels tired.   ROS: A complete 10 point review of system is done which is negative except mentioned above in history of present illness  MEDICAL HISTORY:  Past Medical History:  Diagnosis Date  . Aphakia of right eye   . Arthritis   . Cancer ([place])    SKIN  . Enlarged prostate without lower urinary tract symptoms (luts)   . Esophageal cancer ([place])   . GERD (gastroesophageal reflux disease)   . Glaucoma   . Gout   . History of CVA (cerebrovascular accident)    Cerebral infarction Nov. 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 History of Doppler ultrasound    dopplers revealed mild left ICA stenosis which has remained stable, occluded left subclavian artery with retrograde left vertebral filling and moderately severe right subclavian artery stenosis. he has no symptoms of upper extermity claudication or subclavian steal symptoms.   . History of  DVT (deep vein thrombosis)    right arm  . History of stress test 09/02/2009   abnormal myocardial perfusion scan demonstrating an attenuation defect in the inferior region of the myocardium. No ischemia or infarct/scar is seen in the remaining myocardium. No prior study available for comparison. Abnormal myocardial study EF 79%  . Hyperlipemia   . Hypertension   . Macular degeneration   . Pulmonary embolus ([place])   . Stroke ([place])   . Subclavian artery stenosis (HCC)     SURGICAL HISTORY: Past Surgical History:  Procedure Laterality Date  . CATARACT EXTRACTION    . COLONOSCOPY  2002  . ESOPHAGOGASTRODUODENOSCOPY (EGD) WITH PROPOFOL N/A 11/10/2015   Procedure: ESOPHAGOGASTRODUODENOSCOPY (EGD) WITH PROPOFOL;  Surgeon: Manya Silvas, MD;  Location[facility] ENDOSCOPY;  Service: Endoscopy;  Laterality: N/A;  . ESOPHAGOGASTRODUODENOSCOPY (EGD) WITH PROPOFOL N/A 04/29/2016   Procedure: ESOPHAGOGASTRODUODENOSCOPY (EGD) WITH PROPOFOL;  Surgeon: Manya Silvas, MD;  Location: [facility] ENDOSCOPY;  Service: Endoscopy;  Laterality: N/A;  . ESOPHAGOGASTRODUODENOSCOPY (EGD) WITH PROPOFOL N/A 10/07/2016   Procedure: ESOPHAGOGASTRODUODENOSCOPY (EGD) WITH PROPOFOL;  Surgeon: Manya Silvas, MD;  Location: [facility] ENDOSCOPY;  Service: Endoscopy;  Laterality: N/A;  . FRACTURE SURGERY    . MOHS SURGERY    . PERIPHERAL VASCULAR CATHETERIZATION N/A 11/29/2015   Procedure: Glori Luis Cath Insertion;  Surgeon: Algernon Huxley, MD;  Location: [facility] CV LAB;  Service: Cardiovascular;  Laterality: N/A;    SOCIAL HISTORY: Social History   Social History  . Marital status: Married    Spouse name: N/A  . Number of children:  N/A  . Years of education: N/A   Occupational History  . Not on file.   Social History Main Topics  . Smoking status: Former Smoker    Packs/day: 1.00    Years: 40.00    Types: Cigarettes    Quit date: 06/03/1993  . Smokeless tobacco: Never Used  . Alcohol use No  . Drug use: No  . Sexual  activity: Not on file   Other Topics Concern  . Not on file   Social History Narrative  . No narrative on file    FAMILY HISTORY: Family History  Problem Relation Age of Onset  . Heart disease Brother   . Cancer - Colon Sister   . Cancer Sister   . Other Brother        CABG    ALLERGIES:  is allergic to codeine; allopurinol; simvastatin; and uloric [febuxostat].  MEDICATIONS:  Current Facility-Administered Medications  Medication Dose Route Frequency Provider Last Rate Last Dose  . 0.9 %  sodium chloride infusion   Intravenous Continuous Gladstone Lighter, MD 75 mL/hr at 12/11/16 9702    . acetaminophen (TYLENOL) tablet 650 mg  650 mg Oral Q6H PRN Gladstone Lighter, MD       Or  . acetaminophen (TYLENOL) suppository 650 mg  650 mg Rectal Q6H PRN Gladstone Lighter, MD      . amLODipine (NORVASC) tablet 2.5 mg  2.5 mg Oral Daily Gladstone Lighter, MD   2.5 mg at 12/10/16 1737  . atorvastatin (LIPITOR) tablet 20 mg  20 mg Oral Q breakfast Gladstone Lighter, MD      . cyclobenzaprine (FLEXERIL) tablet 5 mg  5 mg Oral TID PRN Gladstone Lighter, MD      . dorzolamide (TRUSOPT) 2 % ophthalmic solution 1 drop  1 drop Both Eyes BID Gladstone Lighter, MD   1 drop at 12/11/16 0918  . [START ON 12/12/2016] feeding supplement (JEVITY 1.5 CAL/FIBER) liquid 1,000 mL  1,000 mL Per Tube Continuous Manya Silvas, MD      . Derrill Memo ON 12/12/2016] feeding supplement (PRO-STAT SUGAR FREE 64) liquid 30 mL  30 mL Per Tube Daily Manya Silvas, MD      . finasteride (PROSCAR) tablet 5 mg  5 mg Oral Daily Gladstone Lighter, MD   5 mg at 12/10/16 2050  . latanoprost (XALATAN) 0.005 % ophthalmic solution 1 drop  1 drop Both Eyes QHS Gladstone Lighter, MD   1 drop at 12/10/16 2048  . magnesium oxide (MAG-OX) tablet 400 mg  400 mg Oral Daily Gladstone Lighter, MD      . metoprolol succinate (TOPROL-XL) 24 hr tablet 25 mg  25 mg Oral Daily Gladstone Lighter, MD   25 mg at 12/11/16 0907  .  ondansetron (ZOFRAN) tablet 4 mg  4 mg Oral Q6H PRN Gladstone Lighter, MD       Or  . ondansetron (ZOFRAN) injection 4 mg  4 mg Intravenous Q6H PRN Gladstone Lighter, MD      . pantoprazole (PROTONIX) EC tablet 40 mg  40 mg Oral Daily Gladstone Lighter, MD   40 mg at 12/10/16 1736  . sucralfate (CARAFATE) tablet 1 g  1 g Oral TID AC & HS Gladstone Lighter, MD   1 g at 12/11/16 1527  . tapentadol (NUCYNTA) tablet 50 mg  50 mg Per Tube Q6H PRN Loletha Grayer, MD   50 mg at 12/11/16 1527  . timolol (TIMOPTIC) 0.5 % ophthalmic solution 1 drop  1 drop Both Eyes BID  Gladstone Lighter, MD   1 drop at 12/11/16 0907  . vitamin B-12 (CYANOCOBALAMIN) tablet 500 mcg  500 mcg Oral Daily Gladstone Lighter, MD          .  PHYSICAL EXAMINATION:  Vitals:   12/11/16 1425 12/11/16 1454  BP: (!) 144/74 (!) 155/53  Pulse: 72 64  Resp: (!) 21 18  Temp:  (!) 97.5 F (36.4 C)   Filed Weights   12/10/16 1518  Weight: 172 lb 12.8 oz (78.4 kg)    GENERAL: Well-nourished well-developed; Alert, no distress and comfortable.   Accompanied by his wife. EYES: no pallor or icterus OROPHARYNX: no thrush or ulceration. NECK: supple, no masses felt LYMPH:  no palpable lymphadenopathy in the cervical, axillary or inguinal regions LUNGS: decreased breath sounds to auscultation at bases and  No wheeze or crackles HEART/CVS: regular rate & rhythm and no murmurs; No lower extremity edema ABDOMEN: abdomen soft, non-tender and normal bowel sounds; PEG tube in place. Musculoskeletal:no cyanosis of digits and no clubbing  PSYCH: alert & oriented x 3 with fluent speech NEURO: no focal motor/sensory deficits SKIN:  no rashes or significant lesions  LABORATORY DATA:  I have reviewed the data as listed Lab Results  Component Value Date   WBC 6.2 12/11/2016   HGB 8.7 (L) 12/11/2016   HCT 25.5 (L) 12/11/2016   MCV 93.4 12/11/2016   PLT 219 12/11/2016    Recent Labs  09/06/16 1100 12/06/16 1132  12/10/16 1659 12/11/16 0111  NA 137 139 140 140  K 4.3 4.4 3.8 3.9  CL 111 107 109 110  CO2 20* 23 21* 24  GLUCOSE 117* 125* 112* 90  BUN 29* 42* 39* 36*  CREATININE 1.54* 1.73* 1.32* 1.35*  CALCIUM 8.6* 9.1 8.6* 8.5*  GFRNONAA 40* 35* 48* 47*  GFRAA 46* 40* 56* 54*  PROT 7.2 7.9 7.0  --   ALBUMIN 3.4* 3.4* 2.9*  --   AST 17 17 15   --   ALT 8* 7* 9*  --   ALKPHOS 111 115 120  --   BILITOT 0.3 0.6 0.5  --     RADIOGRAPHIC STUDIES: I have personally reviewed the radiological images as listed and agreed with the findings in the report. Dg Chest 2 View  Result Date: 12/10/2016 CLINICAL DATA:  Cough and chest pain. EXAM: CHEST  2 VIEW COMPARISON:  PET CT earlier this day FINDINGS: Tip of the right chest port in the mid SVC. Normal heart size with atherosclerosis and calcification of the thoracic aorta. Dilated esophagus on PET is not confidently seen radiographically, suggestion of air-filled structure posterior to the trachea on the lateral view likely correlates. Pulmonary nodules on PET CT not well seen radiographically. Trace bilateral pleural effusions. The bones are under mineralized. Bone lesions in the left fourth rib and bilateral scapula are not seen. IMPRESSION: 1. Trace pleural effusions. Lung nodules on CT are not visualized radiographically. 2. Dense aortic atherosclerosis. 3. Air distended esophagus corresponding to findings on PET/CT. Electronically Signed   By: Jeb Levering M.D.   On: 12/10/2016 20:08   Dg Esophagus  Result Date: 12/06/2016 CLINICAL DATA:  ESOPHAGEAL CANCER. EXAM: ESOPHOGRAM/BARIUM SWALLOW TECHNIQUE: Single contrast examination was performed using  THIN BARIUM. FLUOROSCOPY TIME:  Fluoroscopy Time:  0 minutes 42 seconds Radiation Exposure Index (if provided by the fluoroscopic device): 12.3 mGy Number of Acquired Spot Images: 15 COMPARISON:  PET-CT 09/04/2016. FINDINGS: Small Zenker's diverticulum. High-grade long stricture of the distal esophagus just  above  the gastroesophageal junction is noted. This is consistent with the patient's esophageal cancer and is in the region of the abnormal activity on PET-CT. PowerPort catheter noted. IMPRESSION: High-grade near occlusive long stricture of the distal esophagus just above the gastroesophageal junction consistent with the patient's known esophageal malignancy. Electronically Signed   By: Marcello Moores  Register   On: 12/06/2016 09:03   Nm Pet Image Restag (ps) Skull Base To Thigh  Result Date: 12/10/2016 CLINICAL DATA:  Subsequent treatment strategy for distal esophageal neoplasm. EXAM: NUCLEAR MEDICINE PET SKULL BASE TO THIGH TECHNIQUE: 12.8 mCi F-18 FDG was injected intravenously. Full-ring PET imaging was performed from the skull base to thigh after the radiotracer. CT data was obtained and used for attenuation correction and anatomic localization. FASTING BLOOD GLUCOSE:  Value: 102 mg/dl COMPARISON:  PET-CT 09/04/2016 FINDINGS: NECK No hypermetabolic lymph nodes in the neck. Focus of uptake in the midline cerebellum is nonspecific. CHEST Intense metabolic activity through the GE junction is increased with SUV max equal 15.2 new increased from 11.0. There is new fluid retained within the esophagus. Lobular nodule in the LEFT lower lobe is unchanged in size at 16 mm. No significant metabolic activity. Small effusion. ABDOMEN/PELVIS No hypermetabolic activity in the liver. No hypermetabolic gastrohepatic ligament lymph nodes. No hypermetabolic abdominopelvic lymph nodes. SKELETON There is new hypermetabolic activity associated with a lytic lesion in the lateral aspect of the LEFT fourth rib with SUV max equal 9.3. A similar lytic lesion in the LEFT scapula/glenoid fossa and RIGHT body of the scapula. Focus of activity in the LEFT gluteus muscle is indeterminate. IMPRESSION: 1. 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 2. Increased in metabolic activity at the GE junction  lesion. There is new fluid retention within the esophagus. 3. Focal activity in the midline cerebellum is indeterminate. Recommend MRI brain with contrast to exclude brain metastasis. 4. Potential soft tissue metastasis to the LEFT gluteus muscle. Indeterminate. 5. Stable pulmonary nodules without metabolic activity. Electronically Signed   By: Suzy Bouchard M.D.   On: 12/10/2016 17:38    ASSESSMENT & PLAN:   83 year old male patient with a history of locally advanced his of his cancer status post chemoradiation is currently admitted to the hospital for worsening dysphagia and poor by mouth intake/PEG tube placement.  # Esophageal adenocarcinoma- status post chemoradiation. Currently on surveillance. Unfortunately PET scan done yesterday shows- progressive disease in esophagus; also left lateral rib; and bilateral scapula. Even the previous biopsy is negative; this is suggestive of progressive disease/metastatic disease. Patient not a candidate for immunotherapy- as PDL 1 negative. Given his comorbidities/age/debility- I'm do not think patient is a good candidate for chemotherapy at this time. However if his performance status improves- systemic chemotherapy could be considered. Patient's wife also brought up- second opinion at [facility]; which I think is reasonable.   # Left rib pain- question palliative radiation. We'll discuss with Dr. Donella Stade regarding radiation.   # Poor by mouth intake/dysphagia/malnutrition status post PEG tube- proceed with tube feeds as per protocol  # History of PE/history of stroke- recommend starting back on Eliquis/Plavix.   # I spoke to patient's daughter over the phone regarding the above plan. I also left a message for patient's PCP Dr. Melford Aase.   All questions were answered.   Thank you Dr. Earleen Newport for allowing me to participate in the care of your pleasant patient. Please do not hesitate to contact me with questions or concerns in the interim.  #  I reviewed the  blood work- with the patient in detail; also reviewed the imaging independently [as summarized above]; and with the patient in detail.      Cammie Sickle, MD 12/11/2016 5:26 PM

## 2016-12-12 ENCOUNTER — Telehealth: Payer: Self-pay | Admitting: Internal Medicine

## 2016-12-12 ENCOUNTER — Institutional Professional Consult (permissible substitution): Payer: Medicare Other | Admitting: Radiation Oncology

## 2016-12-12 ENCOUNTER — Observation Stay: Payer: Medicare Other

## 2016-12-12 ENCOUNTER — Encounter: Payer: Self-pay | Admitting: Surgery

## 2016-12-12 DIAGNOSIS — E46 Unspecified protein-calorie malnutrition: Secondary | ICD-10-CM

## 2016-12-12 DIAGNOSIS — Z923 Personal history of irradiation: Secondary | ICD-10-CM

## 2016-12-12 DIAGNOSIS — C159 Malignant neoplasm of esophagus, unspecified: Secondary | ICD-10-CM | POA: Diagnosis not present

## 2016-12-12 DIAGNOSIS — R630 Anorexia: Secondary | ICD-10-CM | POA: Diagnosis not present

## 2016-12-12 DIAGNOSIS — Z86718 Personal history of other venous thrombosis and embolism: Secondary | ICD-10-CM | POA: Diagnosis not present

## 2016-12-12 DIAGNOSIS — R1310 Dysphagia, unspecified: Secondary | ICD-10-CM | POA: Diagnosis not present

## 2016-12-12 DIAGNOSIS — R0781 Pleurodynia: Secondary | ICD-10-CM

## 2016-12-12 DIAGNOSIS — Z8673 Personal history of transient ischemic attack (TIA), and cerebral infarction without residual deficits: Secondary | ICD-10-CM | POA: Diagnosis not present

## 2016-12-12 DIAGNOSIS — Z9221 Personal history of antineoplastic chemotherapy: Secondary | ICD-10-CM | POA: Diagnosis not present

## 2016-12-12 LAB — BASIC METABOLIC PANEL
ANION GAP: 8 (ref 5–15)
BUN: 28 mg/dL — ABNORMAL HIGH (ref 6–20)
CALCIUM: 8.4 mg/dL — AB (ref 8.9–10.3)
CO2: 22 mmol/L (ref 22–32)
Chloride: 112 mmol/L — ABNORMAL HIGH (ref 101–111)
Creatinine, Ser: 1.24 mg/dL (ref 0.61–1.24)
GFR, EST NON AFRICAN AMERICAN: 52 mL/min — AB (ref >60)
Glucose, Bld: 96 mg/dL (ref 65–99)
POTASSIUM: 4.2 mmol/L (ref 3.5–5.1)
Sodium: 142 mmol/L (ref 135–145)

## 2016-12-12 LAB — CBC
HEMATOCRIT: 26.2 % — AB (ref 40.0–52.0)
HEMOGLOBIN: 8.9 g/dL — AB (ref 13.0–18.0)
MCH: 31.4 pg (ref 26.0–34.0)
MCHC: 33.8 g/dL (ref 32.0–36.0)
MCV: 92.9 fL (ref 80.0–100.0)
Platelets: 208 10*3/uL (ref 150–440)
RBC: 2.82 MIL/uL — AB (ref 4.40–5.90)
RDW: 14.3 % (ref 11.5–14.5)
WBC: 7.9 10*3/uL (ref 3.8–10.6)

## 2016-12-12 LAB — GLUCOSE, CAPILLARY
GLUCOSE-CAPILLARY: 95 mg/dL (ref 65–99)
GLUCOSE-CAPILLARY: 94 mg/dL (ref 65–99)
Glucose-Capillary: 114 mg/dL — ABNORMAL HIGH (ref 65–99)
Glucose-Capillary: 58 mg/dL — ABNORMAL LOW (ref 65–99)

## 2016-12-12 LAB — HEPARIN LEVEL (UNFRACTIONATED)
HEPARIN UNFRACTIONATED: 0.68 [IU]/mL (ref 0.30–0.70)
HEPARIN UNFRACTIONATED: 0.97 [IU]/mL — AB (ref 0.30–0.70)

## 2016-12-12 LAB — APTT: APTT: 69 s — AB (ref 24–36)

## 2016-12-12 MED ORDER — OSMOLITE 1.5 CAL PO LIQD
360.0000 mL | Freq: Four times a day (QID) | ORAL | Status: DC
  Administered 2016-12-12: 237 mL

## 2016-12-12 MED ORDER — OSMOLITE 1.5 CAL PO LIQD: 300.0000 mL | Freq: Four times a day (QID) | ORAL | Status: DC

## 2016-12-12 MED ORDER — OSMOLITE 1.5 CAL PO LIQD: 360.0000 mL | Freq: Four times a day (QID) | ORAL | 0 refills | Status: AC

## 2016-12-12 MED ORDER — HEPARIN SOD (PORK) LOCK FLUSH 100 UNIT/ML IV SOLN
500.0000 [IU] | Freq: Once | INTRAVENOUS | Status: AC
  Administered 2016-12-12: 500 [IU] via INTRAVENOUS
  Filled 2016-12-12: qty 5

## 2016-12-12 MED ORDER — FREE WATER
120.0000 mL | Freq: Four times a day (QID) | Status: DC
  Administered 2016-12-12: 16:00:00 120 mL

## 2016-12-12 MED ORDER — HEPARIN (PORCINE) IN NACL 100-0.45 UNIT/ML-% IJ SOLN: 1100.0000 [IU]/h | INTRAMUSCULAR | Status: DC

## 2016-12-12 MED ORDER — APIXABAN 5 MG PO TABS
5.0000 mg | ORAL_TABLET | Freq: Two times a day (BID) | ORAL | Status: DC
  Administered 2016-12-12: 13:00:00 5 mg via ORAL
  Filled 2016-12-12: qty 1

## 2016-12-12 MED ORDER — FREE WATER: 120.0000 mL | Freq: Four times a day (QID) | 0 refills | Status: AC

## 2016-12-12 MED ORDER — GADOBENATE DIMEGLUMINE 529 MG/ML IV SOLN
15.0000 mL | Freq: Once | INTRAVENOUS | Status: AC | PRN
  Administered 2016-12-12: 15 mL via INTRAVENOUS

## 2016-12-12 NOTE — Progress Notes (Signed)
Pt for discharge home. A/o. No resp distress.  Instructed pt re tube feedings pt performed feeding  By self with my assist and pt did very well. Able to perform feedings at home. Nurse will come out tomorrow  To see pt homecare to deliver  Feedings this pm.  Feedings sent  With pt.

## 2016-12-12 NOTE — Progress Notes (Addendum)
ANTICOAGULATION CONSULT NOTE - Initial Consult  Pharmacy Consult for Heparin dosing and monitoring  Indication: VTE prophylaxis  Allergies  Allergen Reactions  . Codeine Other (See Comments) and Anaphylaxis    Swollen tongue, numb around mouth, slurred speech Swollen tongue, numb around mouth, slurred speech  . Allopurinol Other (See Comments)    Hand pain  . Simvastatin Other (See Comments)    myalgias  . Uloric [Febuxostat] Other (See Comments)    Stomach pain    Patient Measurements: Height: 6\' 2"  (188 cm) Weight: 179 lb 1.6 oz (81.2 kg) IBW/kg (Calculated) : 82.2 Heparin Dosing Weight: 78.4  Vital Signs: Temp: 98 F (36.7 C) (07/11 1935) Temp Source: Oral (07/11 1935) BP: 127/55 (07/11 1935) Pulse Rate: 68 (07/11 1935)  Labs:  Recent Labs  12/10/16 1659 12/11/16 0111 12/12/16 0250  HGB 9.7* 8.7* 8.9*  HCT 28.7* 25.5* 26.2*  PLT 229 219 208  APTT 32 79* 69*  LABPROT 15.8*  --   --   INR 1.25  --   --   HEPARINUNFRC 1.00* 1.13* 0.68  CREATININE 1.32* 1.35* 1.24    Estimated Creatinine Clearance: 51.8 mL/min (by C-G formula based on SCr of 1.24 mg/dL).   Medical History: Past Medical History:  Diagnosis Date  . Aphakia of right eye   . Arthritis   . Cancer ([place])    SKIN  . Enlarged prostate without lower urinary tract symptoms (luts)   . Esophageal cancer ([place])   . GERD (gastroesophageal reflux disease)   . Glaucoma   . Gout   . History of CVA (cerebrovascular accident)    Cerebral infarction Nov. 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 History of Doppler ultrasound    dopplers revealed mild left ICA stenosis which has remained stable, occluded left subclavian artery with retrograde left vertebral filling and moderately severe right subclavian artery stenosis. he has no symptoms of upper extermity claudication or subclavian steal symptoms.   . History of DVT (deep vein thrombosis)    right arm  . History of stress test 09/02/2009   abnormal myocardial perfusion scan demonstrating an attenuation defect in the inferior region of the myocardium. No ischemia or infarct/scar is seen in the remaining myocardium. No prior study available for comparison. Abnormal myocardial study EF 79%  . Hyperlipemia   . Hypertension   . Macular degeneration   . Pulmonary embolus ([place])   . Stroke ([place])   . Subclavian artery stenosis (HCC)     Medications:  Apixaban 5mg  twice daily  Assessment: 83 yo male with history of VTE who takes Apixaban 5mg  twice daily at home. Patient is to undergo EGD on 7/11. Last apixaban dose reported on 7/8 AM.  Instructions were given by MD to stop heparin 6 hours prior to procedure on 7/11.   7/11 1800 MD (Dr. Tamala Julian) wishes to restart heparin drip this evening.   Goal of Therapy:  APPT: 66-102s Heparin level 0.3-0.7 units/ml Monitor platelets by anticoagulation protocol: Yes   Plan: Restart heparin infusion at 1250 units/hr Will need to dose by APTTs until heparin level and APTTs correlate Check APTT and anti-Xa level in 8 hours and daily while on heparin Continue to monitor H&H and platelets CBC with AM labs per protocol   7/12 0300 heparin level 0.68, aPTT 69. Appear to be correlating in therapeutic range. Will continue to follow by heparin level only. Confirmatory anti-Xa in 8 hours.    Eloise Harman, PharmD, BCPS Clinical Pharmacist 12/12/2016 3:22  AM

## 2016-12-12 NOTE — Telephone Encounter (Signed)
Patient can update his DPR at next visit

## 2016-12-12 NOTE — Progress Notes (Signed)
Nutrition Follow-up  DOCUMENTATION CODES:   Non-severe (moderate) malnutrition in context of chronic illness  INTERVENTION:  At 1400 (24 hrs after placement of PEG tube) recommend initiating Osmolite 1.5 Cal 1 can for two feedings today. Can also initiate Pro-Stat 30 ml daily.  Tomorrow can advance to goal TF regimen of Osmolite 1.5 Cal 360 ml QID (total of 6 cans per day) + Pro-Stat 30 ml daily. Goal TF regimen provides 2230 kcal, 104 grams of protein, 1086 ml H2O daily.  Recommend free water flush of 60 ml before and after each bolus tube feeding (additional 480 ml). Also recommend an additional 180 ml of free water TID. As patient can still drink thin liquids, discussed with him he can either drink the 540 ml (18 fl oz) daily or provide per tube (180 ml free water flush TID between bolus tube feedings).   Provided patient and wife with home tube feeding book from [employer] and handout on his home tube feeding regimen. Discussed with patient the type of tube he has, reviewed what each port on his tube is for. Discussed how he will provide his tube feeding regimen by gravity syringe and reviewed proper procedures. Discussed free water flushes and type of water to use at home (bottled water for flushes, distilled water with medications) as they have well water. Discussed daily tube maintenance and cleaning. Discussed how to manage constipation, diarrhea, and dehydration.   Tube feeding supplies will be provided by [employer]. Provided Case Manager with patient's home tube feeding regimen.  NUTRITION DIAGNOSIS:   Malnutrition (Moderate) related to chronic illness (esophageal cancer) as evidenced by energy intake < 75% for > or equal to 1 month, mild depletion of body fat, moderate depletion of body fat, mild depletion of muscle mass, moderate depletions of muscle mass, percent weight loss.  Ongoing.  GOAL:   Patient will meet greater than or equal to 90% of their needs  Not met - will  address with initiation of tube feeding regimen today.  MONITOR:   PO intake, Supplement acceptance, Diet advancement, Labs, Weight trends, TF tolerance, Skin, I & O's  REASON FOR ASSESSMENT:   Malnutrition Screening Tool, Consult Enteral/tube feeding initiation and management  ASSESSMENT:   Marcus [name]  is a 83 y.o. male with a known history of Esophageal adenocarcinoma status post chemoradiation, history of DVT and PE on eliquis, history of CVA with minimal right-sided paresthesias residual, BPH, macular degeneration presents to hospital secondary to worsening dysphagia and need for PEG tube.  Met with patient and his wife at bedside. He reports he is feeling well today. He has not looked at his tube yet. He would like to follow a bolus regimen at home. He would like to start with 1.5 cans QID and see how he tolerates. Reports he tends to be constipated at home. Discussed ways to manage constipation. Discussed that if patient has constipation on this tube feeding that outpatient RD at cancer center can add fiber into his regimen.   Access: PEG tube placed 7/12 (18 [place], 20 cc of water in balloon)   Medications reviewed and include: magnesium oxide 400 mg daily, pantoprazole, Carafate TID before meals and QHS, vitamin B12 500 micrograms daily, NS @ 75 ml/hr, heparin gtt.   Labs reviewed: CBG 58-114 past 24 hrs, Chloride 112, BUN 28.   Discussed with RN.   Discussed with MD. Patient may discharge later this afternoon. Plan is to discontinue his NS @ 75 ml/hr as he will  be initiating his tube feeding this afternoon. Patient is requesting to drink thin liquids and is able to tolerate them. Wants coffee.  Diet Order:  Diet NPO time specified  Skin:  Reviewed, no issues  Last BM:  12/10/16  Height:   Ht Readings from Last 1 Encounters:  12/10/16 6' 2"  (1.88 m)    Weight:   Wt Readings from Last 1 Encounters:  12/12/16 179 lb 6.4 oz (81.4 kg)    Ideal Body Weight:  80.9  kg  BMI:  Body mass index is 23.03 kg/m.  Estimated Nutritional Needs:   Kcal:  2060-2220 (MSJ x 1.3-1.4)  Protein:  98-122 grams (1.2-1.5 grams/kg)  Fluid:  2 L/day (25 ml/kg)  EDUCATION NEEDS:   Education needs addressed  Willey Blade, MS, RD, LDN Pager: [phone] After Hours Pager: [phone]

## 2016-12-12 NOTE — Progress Notes (Signed)
He reports excellent progress today 1 day after PEG. He is up walking. He is tolerating his tube feeding satisfactorily. He is sipping some clear liquids. His dressings been changed.  Diagnosis esophageal cancer with dysphagia  I discussed discharge plans. He will have home health nursing. Plan to take Osmolite 1.5-calorie 6 per day followed by water.  Follow-up in my office in one month for suture removal

## 2016-12-12 NOTE — Progress Notes (Signed)
Radiation Oncology Follow up Note  Name: Marcus [name]   Date:   12/10/2016 MRN:  [id number] DOB: 14-Jul-1931    This 83 y.o. 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 provider found  HPI: Patient is an 83 year old male treated approximate 1 year ago with concurrent chemoradiation for stage II esophageal cancer. He had a PET CT as well as upper endoscopy evidence of complete response and had been doing well.Marland Kitchen He was recently admitted for worsening by mouth intake and dysphagia with inability to swallow and a PEG tube was placed. PET scan also been performed showing new skeletal metastasis in the left fourth rib as well as left and right scapula. He also increased hypermetabolic activity the GE junction consistent with recurrent disease. He's been having left shoulder pain I been asked to evaluate him for possible palliative radiation therapy to that area. He is also having a planned MRI scan of his brain performed.  COMPLICATIONS OF TREATMENT: none  FOLLOW UP COMPLIANCE: keeps appointments   PHYSICAL EXAM:  BP (!) 121/50 (BP Location: Right Arm)   Pulse 89   Temp 97.8 F (36.6 C) (Oral)   Resp (!) 24   Ht 6\' 2"  (1.88 m)   Wt 179 lb 6.4 oz (81.4 kg)   SpO2 96%   BMI 23.03 kg/m  Marcus [name] male seen in his hospital room. Range of motion of his left upper extremity does not elicit pain motor sensory and DTR levels are equal and symmetric in upper lower extremities. PEG tube has been placed. Well-developed well-nourished patient in NAD. HEENT reveals PERLA, EOMI, discs not visualized.  Oral cavity is clear. No oral mucosal lesions are identified. Neck is clear without evidence of cervical or supraclavicular adenopathy. Lungs are clear to A&P. Cardiac examination is essentially unremarkable with regular rate and rhythm without murmur rub or thrill. Abdomen is benign  with no organomegaly or masses noted. Motor sensory and DTR levels are equal and symmetric in the upper and lower extremities. Cranial nerves II through XII are grossly intact. Proprioception is intact. No peripheral adenopathy or edema is identified. No motor or sensory levels are noted. Crude visual fields are within normal range.  RADIOLOGY RESULTS: PET CT scan is reviewed and compatible above-stated findings  PLAN: At this time I to go ahead with a single fraction of palliative radiation therapy 800 cGy 1 to his left shoulder incorporating the areas of left shoulder and scapular involvement by PET CT criteria. I have personally set up and ordered CT simulation for first thing next week. Risks and benefits of treatment including possible skin reaction fatigue all were discussed with the patient and his wife. Also will evaluate his brain MRI 1 status performed.  I would like to take this opportunity to thank you for allowing me to participate in the care of your patient.Armstead Peaks., MD

## 2016-12-12 NOTE — Discharge Instructions (Signed)
Tube Feeding Schedule for Marcus [name], Marcus [name]   Tube Feeding Formula: Osmolite 1.5 Cal  Total cans recommended daily to meet nutritional needs: 6 cans/bottles per day  4 Feedings per day: 6 cans total per day   Suggested Schedule: Osmolite 1.5 Cal  8:00 am 360 ml or 12 ounces (1.5 can)  Free water flush of 28ml before and after feeding   12:00 pm 360 ml or 12 ounces (1.5 can)  Free water flush of 82ml before and after feeding   4:00 pm 360 ml or 12 ounces (1.5 can)  Free water flush of 59ml before and after feeding   8:00 pm 360 ml or 12 ounces (1.5 cans)  Free water flush of 60 mL before and after feeding   Also provide Pro-Stat 30 ml once daily. Place 1 oz (30 ml) of Pro-Stat in a clean 4- to 6-oz (120- to 180- ml) container. Add 1-2 oz (30-60 ml) water. Mix well with disposable spoon. Flush feeding tube with 1 oz (30 ml water). Administer mixture of Pro-Stat mixed with water with 2 oz (60 ml) or larger syringe. Flush with an additional 1 oz (30 ml) water. Should not be mixed with a tube-feeding formula due to curdling.   Do not lie flat during feeding; Remain upright (in chair or standing) for at least 45 minutes after each bolus feeding.   You need additional fluid during the day to meet your fluid needs. Aim for 18 oz (540 ml) fluid of daily. Can drink orally. If you cannot drink fluid, recommend additional water flushes of 180 ml three times a day between feedings for hydration. Flush with 30 mL of water before and after medication administration. Water flushes should be at room temperature.   Please wash hands before and after each feeding is administered.   Opened cans of unused feeding can be covered and placed in refrigerator for use at next feeding   **If not tolerating Osmolite 1.5 Cal, recommend speaking with RD/MD regarding changing formula to something you can tolerate better    Information on my medicine - ELIQUIS (apixaban)  Why was Eliquis prescribed for you? Eliquis was  prescribed for you to reduce the risk of forming blood clots.  What do You need to know about Eliquis ? Take your Eliquis TWICE DAILY - one tablet in the morning and one tablet in the evening with or without food.  It would be best to take the doses about the same time each day.  If you have difficulty swallowing the tablet whole please discuss with your pharmacist how to take the medication safely.  Take Eliquis exactly as prescribed by your doctor and DO NOT stop taking Eliquis without talking to the doctor who prescribed the medication.  Stopping may increase your risk of developing a new clot or stroke.  Refill your prescription before you run out.  After discharge, you should have regular check-up appointments with your healthcare provider that is prescribing your Eliquis.  In the future your dose may need to be changed if your kidney function or weight changes by a significant amount or as you get older.  What do you do if you miss a dose? If you miss a dose, take it as soon as you remember on the same day and resume taking twice daily.  Do not take more than one dose of ELIQUIS at the same time.  Important Safety Information A possible side effect of Eliquis is bleeding. You should call your healthcare provider  right away if you experience any of the following: ? Bleeding from an injury or your nose that does not stop. ? Unusual colored urine (red or dark brown) or unusual colored stools (red or black). ? Unusual bruising for unknown reasons. ? A serious fall or if you hit your head (even if there is no bleeding).  Some medicines may interact with Eliquis and might increase your risk of bleeding or clotting while on Eliquis. To help avoid this, consult your healthcare provider or pharmacist prior to using any new prescription or non-prescription medications, including herbals, vitamins, non-steroidal anti-inflammatory drugs (NSAIDs) and supplements.  This website has more  information on Eliquis (apixaban): [email or web address].

## 2016-12-12 NOTE — Progress Notes (Signed)
ANTICOAGULATION CONSULT NOTE - Initial Consult  Pharmacy Consult for Heparin dosing and monitoring  Indication: VTE prophylaxis  Allergies  Allergen Reactions  . Codeine Other (See Comments) and Anaphylaxis    Swollen tongue, numb around mouth, slurred speech Swollen tongue, numb around mouth, slurred speech  . Allopurinol Other (See Comments)    Hand pain  . Simvastatin Other (See Comments)    myalgias  . Uloric [Febuxostat] Other (See Comments)    Stomach pain    Patient Measurements: Height: 6\' 2"  (188 cm) Weight: 179 lb 6.4 oz (81.4 kg) IBW/kg (Calculated) : 82.2 Heparin Dosing Weight: 78.4  Vital Signs: Temp: 97.8 F (36.6 C) (07/12 0816) Temp Source: Oral (07/12 0816) BP: 121/50 (07/12 0816) Pulse Rate: 89 (07/12 0816)  Labs:  Recent Labs  12/10/16 1659 12/11/16 0111 12/12/16 0250 12/12/16 1114  HGB 9.7* 8.7* 8.9*  --   HCT 28.7* 25.5* 26.2*  --   PLT 229 219 208  --   APTT 32 79* 69*  --   LABPROT 15.8*  --   --   --   INR 1.25  --   --   --   HEPARINUNFRC 1.00* 1.13* 0.68 0.97*  CREATININE 1.32* 1.35* 1.24  --     Estimated Creatinine Clearance: 52 mL/min (by C-G formula based on SCr of 1.24 mg/dL).   Medical History: Past Medical History:  Diagnosis Date  . Aphakia of right eye   . Arthritis   . Cancer ([place])    SKIN  . Enlarged prostate without lower urinary tract symptoms (luts)   . Esophageal cancer ([place])   . GERD (gastroesophageal reflux disease)   . Glaucoma   . Gout   . History of CVA (cerebrovascular accident)    Cerebral infarction Nov. 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 History of Doppler ultrasound    dopplers revealed mild left ICA stenosis which has remained stable, occluded left subclavian artery with retrograde left vertebral filling and moderately severe right subclavian artery stenosis. he has no symptoms of upper extermity claudication or subclavian steal symptoms.   . History of DVT (deep vein thrombosis)    right arm  . History of stress test 09/02/2009   abnormal myocardial perfusion scan demonstrating an attenuation defect in the inferior region of the myocardium. No ischemia or infarct/scar is seen in the remaining myocardium. No prior study available for comparison. Abnormal myocardial study EF 79%  . Hyperlipemia   . Hypertension   . Macular degeneration   . Pulmonary embolus ([place])   . Stroke ([place])   . Subclavian artery stenosis ([facility])     Medications:  Apixaban 5mg  twice daily  Assessment: 83 yo male with history of VTE who takes Apixaban 5mg  twice daily at home. Patient is to undergo EGD on 7/11. Last apixaban dose reported on 7/8 AM.  Instructions were given by MD to stop heparin 6 hours prior to procedure on 7/11.   7/11 1800 MD (Dr. Tamala Julian) wishes to restart heparin drip this evening.   Goal of Therapy:  APPT: 66-102s Heparin level 0.3-0.7 units/ml Monitor platelets by anticoagulation protocol: Yes   Plan: Heparin level is supratherapeutic. Will reduce rate to 1100 units/hr. Recheck in 6 hours. RN made aware of the change.   Ramond Dial, PharmD, BCPS Clinical Pharmacist 12/12/2016 12:16 PM

## 2016-12-12 NOTE — Progress Notes (Signed)
Juanmiguel Defelice   DOB:08/12/1931   JJ#:[id number]    Subjective: Patient did not sleep well last night because of multiple interruptions of his sleep from the staff. Otherwise denies any pain. Denies any abdominal distention. Denies any headaches.  ROS: No chest pain or shortness of breath or cough. Tube feeds not started yet.   Objective:  Vitals:   12/12/16 1305 12/12/16 1457  BP:  (!) 148/53  Pulse: 70 61  Resp:  16  Temp:  98 F (36.7 C)     Intake/Output Summary (Last 24 hours) at 12/12/16 1837 Last data filed at 12/12/16 1600  Gross per 24 hour  Intake          4249.21 ml  Output                0 ml  Net          4249.21 ml    GENERAL: Well-nourished well-developed; Alert, no distress and comfortable.   Accompanied by his wife. EYES: no pallor or icterus OROPHARYNX: no thrush or ulceration. NECK: supple, no masses felt LYMPH:  no palpable lymphadenopathy in the cervical, axillary or inguinal regions LUNGS: decreased breath sounds to auscultation at bases and  No wheeze or crackles HEART/CVS: regular rate & rhythm and no murmurs; No lower extremity edema ABDOMEN: abdomen soft, non-tender and normal bowel sounds; PEG tube in place. Musculoskeletal:no cyanosis of digits and no clubbing  PSYCH: alert & oriented x 3 with fluent speech NEURO: no focal motor/sensory deficits SKIN:  no rashes or significant lesions   Labs:  Lab Results  Component Value Date   WBC 7.9 12/12/2016   HGB 8.9 (L) 12/12/2016   HCT 26.2 (L) 12/12/2016   MCV 92.9 12/12/2016   PLT 208 12/12/2016   NEUTROABS 6.0 12/06/2016    Lab Results  Component Value Date   NA 142 12/12/2016   K 4.2 12/12/2016   CL 112 (H) 12/12/2016   CO2 22 12/12/2016    Studies:  Mr Jeri Cos GY Contrast  Result Date: 12/12/2016 CLINICAL DATA:  Esophageal cancer. Abnormal PET scan of the head. Rule out metastatic disease. EXAM: MRI HEAD WITHOUT AND WITH CONTRAST TECHNIQUE: Multiplanar, multiecho pulse sequences of  the brain and surrounding structures were obtained without and with intravenous contrast. CONTRAST:  15 mL MultiHance IV COMPARISON:  MRI head 12/24/2014 FINDINGS: Brain: Negative for metastatic disease to the brain. Cerebellar vermis appears normal, as was questioned on the PET scan. Moderate to advanced atrophy. Moderate chronic ischemic changes in the cerebral white matter. Chronic ischemia in the pons and cerebellum bilaterally. Progression of atrophy and chronic ischemia since 2016 MRI. Negative for hemorrhage or mass. No edema or shift of the midline structures. Vascular: Normal arterial flow void Skull and upper cervical spine: Negative Sinuses/Orbits: Negative Other: None IMPRESSION: Negative for metastatic disease to the head Progression of atrophy and chronic ischemia since 2016. No acute intracranial abnormality. Electronically Signed   By: Franchot Gallo M.D.   On: 12/12/2016 12:44    Assessment & Plan:   83 year old male patient with a history of locally advanced his of his cancer status post chemoradiation is currently admitted to the hospital for worsening dysphagia and poor by mouth intake/PEG tube placement.  # Esophageal adenocarcinoma- status post chemoradiation- PET shows recurrent disease. Reevaluate outpatient for systemic chemotherapy.  # Left rib pain- Discussed with Dr. Donella Stade regarding radiation. We will offer radiation treatment for palliative control.  # Poor by mouth intake/dysphagia/malnutrition status  post PEG tube- proceed with tube feeds as per protocol  # History of PE/history of stroke- recommend starting back on Eliquis; hold of Plavix given the risk of increased bleeding.  # Subtle lesion noted on the PET scan; MRI ordered/reviewed- negative for metastatic disease.  # Also spoke to patient's PCP Dr. Melford Aase- regarding the above impression. Discussed with Dr. Earleen Newport.   Cammie Sickle, MD 12/12/2016  6:37 PM

## 2016-12-12 NOTE — Telephone Encounter (Signed)
I spoke to daughter/pt/wife- pt is okay having the daughter as one of the persons to contact e: pt information.   J/H- please add daughter to contact list. Thx

## 2016-12-12 NOTE — Evaluation (Signed)
Physical Therapy Evaluation Patient Details Name: Marcus [name] MRN: [id number] DOB: [birth year] Today's Date: 12/12/2016   History of Present Illness  Marcus [name]  is a 83 y.o. male with a known history of Esophageal adenocarcinoma status post chemoradiation, history of DVT and PE on eliquis, CKD, history of CVA with minimal right-sided paresthesias residual, BPH, macular degeneration presents to hospital secondary to worsening dysphagia and need for PEG tube. patient is s/p PEG tube placement yesterday. He reports still feeling weak after not eating anything for last few days.   Clinical Impression  83 yo Male reports that he is ready to go home. He states that he was living with his wife in an apartment prior to admittance. He used a [facility] for gait when walking outside of home but did well inside home. He reports being independent in self care ADLs including bathing/dressing. Patient exhibits good functional mobility. He is mod I for bed mobility, requires supervision for sit<>Stand transfer with SPC. Patient ambulated >200 feet with SPC, supervision to CGA. He does ambulate at a slightly slower gait speed of 1.9 feet/sec but is not considered a fall risk. He is able to negotiate 8 steps with CGA with reciprocal ascending, step to pattern descending with 1 rail assist. Patient does report increased fatigue and shortness of breath after walking. His SPO2 was 94% and HR 70 following gait trial. Patient denies any pain or discomfort. He would benefit from skilled PT intervention to improve strength and mobility and reduce fall risk. PT recommended home health PT upon discharge to work on strength and endurance but patient declined stating that he can walk around his apartment well without difficulty. Patient instructed to follow up with MD if he started feeling increased weakness or fatigue.     Follow Up Recommendations No PT follow up;Supervision - Intermittent    Equipment Recommendations   None recommended by PT    Recommendations for Other Services       Precautions / Restrictions Precautions Precautions: None Restrictions Weight Bearing Restrictions: No      Mobility  Bed Mobility Overal bed mobility: Modified Independent             General bed mobility comments: uses bed rail but able to move in bed well without assistance;   Transfers Overall transfer level: Needs assistance Equipment used: Straight cane Transfers: Sit to/from Stand Sit to Stand: Supervision         General transfer comment: exhibiting good safety awareness with good hand placement;   Ambulation/Gait Ambulation/Gait assistance: Min guard Ambulation Distance (Feet): 225 Feet Assistive device: Straight cane Gait Pattern/deviations: Step-through pattern;Decreased step length - right;Decreased step length - left;Decreased dorsiflexion - left;Decreased dorsiflexion - right Gait velocity: 1.9 feet/sec   General Gait Details: exhibits normal base of support with reciprocal gait pattern, does require supervision to CGA for safety with gait.   Stairs Stairs: Yes Stairs assistance: Min guard Stair Management: One rail Right;Step to pattern;Alternating pattern Number of Stairs: 8 General stair comments: ascending alternating feet, descending step to pattern requiring increased time and cues to slow down for safety;   Wheelchair Mobility    Modified Rankin (Stroke Patients Only)       Balance Overall balance assessment: No apparent balance deficits (not formally assessed)  Pertinent Vitals/Pain Pain Assessment: No/denies pain    Home Living Family/patient expects to be discharged to:: Private residence Living Arrangements: Spouse/significant other Available Help at Discharge: Family;Available 24 hours/day Type of Home: Apartment Home Access: Stairs to enter Entrance Stairs-Rails: Right Entrance Stairs-Number of  Steps: 15 steps total, 2 steps with no handrail, the rest with rail on right side;  Home Layout: One level Home Equipment: Cane - single point      Prior Function Level of Independence: Independent with assistive device(s)         Comments: patient reports being mostly independent with self care ADLs including bathing and ressing; he would use a SPC when walking outside of home;      Hand Dominance   Dominant Hand: Right    Extremity/Trunk Assessment   Upper Extremity Assessment Upper Extremity Assessment: Overall WFL for tasks assessed    Lower Extremity Assessment Lower Extremity Assessment: Overall WFL for tasks assessed (grossly 4/5 bilaterally; no numbness/tingling; )    Cervical / Trunk Assessment Cervical / Trunk Assessment: Normal  Communication   Communication: No difficulties  Cognition Arousal/Alertness: Awake/alert Behavior During Therapy: WFL for tasks assessed/performed Overall Cognitive Status: Within Functional Limits for tasks assessed                                        General Comments General comments (skin integrity, edema, etc.): No redness noted around PEG tube placement; intact skin throughout- no wounds; no swelling noted;     Exercises Other Exercises Other Exercises: Educated patient in the importance of walking regularly throughout apartment for strengthening and endurance training. Attempted to educate patient in seated LE exercise but patient not interested;    Assessment/Plan    PT Assessment Patient needs continued PT services  PT Problem List Decreased strength;Decreased mobility;Decreased activity tolerance       PT Treatment Interventions Gait training;Stair training;Functional mobility training;Balance training;Therapeutic exercise;Therapeutic activities;Patient/family education    PT Goals (Current goals can be found in the Care Plan section)  Acute Rehab PT Goals Patient Stated Goal: "I want to go home."   PT Goal Formulation: With patient Time For Goal Achievement: 12/19/16 Potential to Achieve Goals: Good    Frequency Min 2X/week   Barriers to discharge Inaccessible home environment has 15 steps to enter house;     Co-evaluation               AM-PAC PT "6 Clicks" Daily Activity  Outcome Measure Difficulty turning over in bed (including adjusting bedclothes, sheets and blankets)?: None Difficulty moving from lying on back to sitting on the side of the bed? : None Difficulty sitting down on and standing up from a chair with arms (e.g., wheelchair, bedside commode, etc,.)?: A Little Help needed moving to and from a bed to chair (including a wheelchair)?: A Little Help needed walking in hospital room?: A Little Help needed climbing 3-5 steps with a railing? : A Little 6 Click Score: 20    End of Session Equipment Utilized During Treatment: Gait belt Activity Tolerance: Patient tolerated treatment well;No increased pain Patient left: in bed;with call bell/phone within reach;with family/visitor present Nurse Communication: Mobility status PT Visit Diagnosis: Unsteadiness on feet (R26.81);Muscle weakness (generalized) (M62.81)    Time: 9381-0175 PT Time Calculation (min) (ACUTE ONLY): 16 min   Charges:   PT Evaluation $PT Eval Low Complexity: 1 Procedure     PT  G Codes:   PT G-Codes **NOT FOR INPATIENT CLASS** Functional Assessment Tool Used: AM-PAC 6 Clicks Basic Mobility;Clinical judgement Functional Limitation: Mobility: Walking and moving around Mobility: Walking and Moving Around Current Status (H6314): At least 20 percent but less than 40 percent impaired, limited or restricted Mobility: Walking and Moving Around Goal Status [phone]): At least 1 percent but less than 20 percent impaired, limited or restricted      Trenace Coughlin  PT, DPT 12/12/2016, 2:06 PM

## 2016-12-12 NOTE — Discharge Summary (Signed)
[facility] [place] NAME: Marcus [name]    MR#:  [id number]  DATE OF BIRTH:  27-May-1932  DATE OF ADMISSION:  12/10/2016 ADMITTING PHYSICIAN: Gladstone Lighter, MD  DATE OF DISCHARGE: 12/12/2016  PRIMARY CARE PHYSICIAN: Chesley Noon, MD    ADMISSION DIAGNOSIS:  Esophageal CA  Peg tube placement DYSPHAGIA severe dysphagia  DISCHARGE DIAGNOSIS:  Active Problems:   Dysphagia   SECONDARY DIAGNOSIS:   Past Medical History:  Diagnosis Date  . Aphakia of right eye   . Arthritis   . Cancer ([place])    SKIN  . Enlarged prostate without lower urinary tract symptoms (luts)   . Esophageal cancer ([place])   . GERD (gastroesophageal reflux disease)   . Glaucoma   . Gout   . History of CVA (cerebrovascular accident)    Cerebral infarction Nov. 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 History of Doppler ultrasound    dopplers revealed mild left ICA stenosis which has remained stable, occluded left subclavian artery with retrograde left vertebral filling and moderately severe right subclavian artery stenosis. he has no symptoms of upper extermity claudication or subclavian steal symptoms.   . History of DVT (deep vein thrombosis)    right arm  . History of stress test 09/02/2009   abnormal myocardial perfusion scan demonstrating an attenuation defect in the inferior region of the myocardium. No ischemia or infarct/scar is seen in the remaining myocardium. No prior study available for comparison. Abnormal myocardial study EF 79%  . Hyperlipemia   . Hypertension   . Macular degeneration   . Pulmonary embolus ([place])   . Stroke ([place])   . Subclavian artery stenosis ([place])     HOSPITAL COURSE:   1. 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 PEG placement by GI and surgery yesterday. Patient will go with Tylenol postoperatively. Patient is a full code. Case discussed with Dr. Rogue Bussing oncology this morning. I explained again  to the patient that any treatments that are offered will be palliative in nature. MRI of the brain negative for metastases. Nursing and dietary teaching about PEG and PEG feeding and water flushes. Patient states that he would like to drink fluids also. 2. History of DVT and pulmonary embolism. Restarted Eliquis 3. History of stroke. Will use Eliquis for stroke prevention and hold Plavix at this time 4. Essential hypertension on Norvasc and metoprolol 5. Glaucoma on timolol 6. Hyperlipidemia unspecified on atorvastatin 7. Anemia of chronic disease. Follow up as outpatient 8. Chronic kidney disease stage III. 9. BPH on finasteride  DISCHARGE CONDITIONS:   Satisfactory  CONSULTS OBTAINED:  Treatment Team:  Manya Silvas, MD  DRUG ALLERGIES:   Allergies  Allergen Reactions  . Codeine Other (See Comments) and Anaphylaxis    Swollen tongue, numb around mouth, slurred speech Swollen tongue, numb around mouth, slurred speech  . Allopurinol Other (See Comments)    Hand pain  . Simvastatin Other (See Comments)    myalgias  . Uloric [Febuxostat] Other (See Comments)    Stomach pain    DISCHARGE MEDICATIONS:   Current Discharge Medication List    START taking these medications   Details  Nutritional Supplements (FEEDING SUPPLEMENT, OSMOLITE 1.5 CAL,) LIQD Place 360 mLs into feeding tube 4 (four) times daily. Qty: 180 Bottle, Refills: 0    Water For Irrigation, Sterile (FREE WATER) SOLN Place 120 mLs into feeding tube 4 (four) times daily. Qty: 15000 mL, Refills: 0  CONTINUE these medications which have NOT CHANGED   Details  amLODipine (NORVASC) 2.5 MG tablet Take 2.5 mg by mouth daily. Refills: 0    apixaban (ELIQUIS) 5 MG TABS tablet Take 1 tablet (5 mg total) by mouth 2 (two) times daily. Start on 03/03/2016 Qty: 60 tablet, Refills: 0    atorvastatin (LIPITOR) 20 MG tablet Take 1 tablet (20 mg total) by mouth daily at 6 PM. Qty: 30 tablet, Refills: 1     dorzolamide (TRUSOPT) 2 % ophthalmic solution Place 1 drop into both eyes 2 (two) times daily.    finasteride (PROSCAR) 5 MG tablet Take 5 mg by mouth daily. Refills: 10    latanoprost (XALATAN) 0.005 % ophthalmic solution Place 1 drop into both eyes at bedtime.    metoprolol succinate (TOPROL-XL) 25 MG 24 hr tablet TAKE ONE TABLET (25 MG TOTAL) BY MOUTH DAILY.   Associated Diagnoses: Lacunar infarct, acute (HCC)    sucralfate (CARAFATE) 1 g tablet Take 1 g by mouth 4 (four) times daily.    terazosin (HYTRIN) 10 MG capsule Take 10 mg by mouth at bedtime. Refills: 0    timolol (TIMOPTIC) 0.5 % ophthalmic solution Place 1 drop into both eyes 2 (two) times daily. Refills: 3    Vitamin D, Ergocalciferol, (DRISDOL) 50000 UNITS CAPS Take 50,000 Units by mouth every 7 (seven) days.    acetaminophen (TYLENOL) 500 MG tablet Take 500 mg by mouth every 6 (six) hours as needed.    calcium carbonate (TUMS EX) 750 MG chewable tablet Chew 1 tablet by mouth daily.      STOP taking these medications     clopidogrel (PLAVIX) 75 MG tablet      ondansetron (ZOFRAN) 8 MG tablet      prochlorperazine (COMPAZINE) 10 MG tablet      cyanocobalamin 500 MCG tablet      magnesium oxide (MAG-OX) 400 MG tablet      omeprazole (PRILOSEC) 20 MG capsule          DISCHARGE INSTRUCTIONS:   Follow-up PMD one week Follow-up with Dr. Rogue Bussing 2 weeks  If you experience worsening of your admission symptoms, develop shortness of breath, life threatening emergency, suicidal or homicidal thoughts you must seek medical attention immediately by calling 911 or calling your MD immediately  if symptoms less severe.  You Must read complete instructions/literature along with all the possible adverse reactions/side effects for all the Medicines you take and that have been prescribed to you. Take any new Medicines after you have completely understood and accept all the possible adverse reactions/side effects.    Please note  You were cared for by a hospitalist during your hospital stay. If you have any questions about your discharge medications or the care you received while you were in the hospital after you are discharged, you can call the unit and asked to speak with the hospitalist on call if the hospitalist that took care of you is not available. Once you are discharged, your primary care physician will handle any further medical issues. Please note that NO REFILLS for any discharge medications will be authorized once you are discharged, as it is imperative that you return to your primary care physician (or establish a relationship with a primary care physician if you do not have one) for your aftercare needs so that they can reassess your need for medications and monitor your lab values.    Today   CHIEF COMPLAINT:  Sent in for PEG placement  HISTORY  OF PRESENT ILLNESS:  Marcus [name]  is a 83 y.o. male with a known history of Esophageal cancer presents with dysphagia and weight loss   VITAL SIGNS:  Blood pressure (!) 148/53, pulse 61, temperature 98 F (36.7 C), temperature source Oral, resp. rate 16, height 6\' 2"  (1.88 m), weight 81.4 kg (179 lb 6.4 oz), SpO2 94 %.    PHYSICAL EXAMINATION:  GENERAL:  83 y.o.-year-old patient lying in the bed with no acute distress.  EYES: Pupils equal, round, reactive to light and accommodation. No scleral icterus. Extraocular muscles intact.  HEENT: Head atraumatic, normocephalic. Oropharynx and nasopharynx clear.  NECK:  Supple, no jugular venous distention. No thyroid enlargement, no tenderness.  LUNGS: Normal breath sounds bilaterally, no wheezing, rales,rhonchi or crepitation. No use of accessory muscles of respiration.  CARDIOVASCULAR: S1, S2 normal. No murmurs, rubs, or gallops.  ABDOMEN: Soft, non-tender, non-distended. Bowel sounds present. No organomegaly or mass.  EXTREMITIES: No pedal edema, cyanosis, or clubbing.  NEUROLOGIC: Cranial  nerves II through XII are intact. Muscle strength 5/5 in all extremities. Sensation intact. Gait not checked.  PSYCHIATRIC: The patient is alert and oriented x 3.  SKIN: No obvious rash, lesion, or ulcer.   DATA REVIEW:   CBC  Recent Labs Lab 12/12/16 0250  WBC 7.9  HGB 8.9*  HCT 26.2*  PLT 208    Chemistries   Recent Labs Lab 12/10/16 1659  12/12/16 0250  NA 140  < > 142  K 3.8  < > 4.2  CL 109  < > 112*  CO2 21*  < > 22  GLUCOSE 112*  < > 96  BUN 39*  < > 28*  CREATININE 1.32*  < > 1.24  CALCIUM 8.6*  < > 8.4*  AST 15  --   --   ALT 9*  --   --   ALKPHOS 120  --   --   BILITOT 0.5  --   --   < > = values in this interval not displayed.    RADIOLOGY:  Dg Chest 2 View  Result Date: 12/10/2016 CLINICAL DATA:  Cough and chest pain. EXAM: CHEST  2 VIEW COMPARISON:  PET CT earlier this day FINDINGS: Tip of the right chest port in the mid SVC. Normal heart size with atherosclerosis and calcification of the thoracic aorta. Dilated esophagus on PET is not confidently seen radiographically, suggestion of air-filled structure posterior to the trachea on the lateral view likely correlates. Pulmonary nodules on PET CT not well seen radiographically. Trace bilateral pleural effusions. The bones are under mineralized. Bone lesions in the left fourth rib and bilateral scapula are not seen. IMPRESSION: 1. Trace pleural effusions. Lung nodules on CT are not visualized radiographically. 2. Dense aortic atherosclerosis. 3. Air distended esophagus corresponding to findings on PET/CT. Electronically Signed   By: Jeb Levering M.D.   On: 12/10/2016 20:08   Mr Jeri Cos GY Contrast  Result Date: 12/12/2016 CLINICAL DATA:  Esophageal cancer. Abnormal PET scan of the head. Rule out metastatic disease. EXAM: MRI HEAD WITHOUT AND WITH CONTRAST TECHNIQUE: Multiplanar, multiecho pulse sequences of the brain and surrounding structures were obtained without and with intravenous contrast. CONTRAST:   15 mL MultiHance IV COMPARISON:  MRI head 12/24/2014 FINDINGS: Brain: Negative for metastatic disease to the brain. Cerebellar vermis appears normal, as was questioned on the PET scan. Moderate to advanced atrophy. Moderate chronic ischemic changes in the cerebral white matter. Chronic ischemia in the pons and cerebellum bilaterally.  Progression of atrophy and chronic ischemia since 2016 MRI. Negative for hemorrhage or mass. No edema or shift of the midline structures. Vascular: Normal arterial flow void Skull and upper cervical spine: Negative Sinuses/Orbits: Negative Other: None IMPRESSION: Negative for metastatic disease to the head Progression of atrophy and chronic ischemia since 2016. No acute intracranial abnormality. Electronically Signed   By: Franchot Gallo M.D.   On: 12/12/2016 12:44     Management plans discussed with the patient, family and they are in agreement.  CODE STATUS:     Code Status Orders        Start     Ordered   12/10/16 1511  Full code  Continuous     12/10/16 1511    Code Status History    Date Active Date Inactive Code Status Order ID Comments User Context   02/23/2016 10:36 PM 02/26/2016  3:22 PM Full Code [id number]  Toy Baker, MD Inpatient   12/24/2014  6:24 PM 12/26/2014  7:01 PM Full Code [id number]  Delfina Redwood, MD Inpatient    Advance Directive Documentation     Most Recent Value  Type of Advance Directive  Healthcare Power of [place], Living will  Pre-existing out of facility DNR order (yellow form or pink MOST form)  -  "MOST" Form in Place?  -      TOTAL TIME TAKING CARE OF THIS PATIENT: 35 minutes.    Loletha Grayer M.D on 12/12/2016 at 4:05 PM  Between 7am to 6pm - Pager - [phone]  After 6pm go to [email or web address] - password [email or web address] [facility]  [phone]  CC: Primary care physician; Chesley Noon, MD

## 2016-12-12 NOTE — Care Management (Signed)
PEG placed yesterday. Dietitian recommending Osmolite 1.5 cal for feedings. Also, Pro-mod as a supplement.  Palliative radiation to left shoulder per DrDonella Stade.  If tube feeding are tolerated. Possible discharge to home later today per Dr. Leslye Peer. Shelbie Ammons RN MSN CCM Care management [phone]

## 2016-12-12 NOTE — Care Management (Signed)
Discharge to home today per Dr. Leslye Peer. [facility] will be providing feeding supplies and nursing services.  Brenton Grills, [place] representative updated Shelbie Ammons RN MSN CCM Care management [phone]

## 2016-12-12 NOTE — Consult Note (Signed)
ANTICOAGULATION CONSULT NOTE - Initial Consult  Pharmacy Consult for apixaban Indication: hx of DVT/PE  Allergies  Allergen Reactions  . Codeine Other (See Comments) and Anaphylaxis    Swollen tongue, numb around mouth, slurred speech Swollen tongue, numb around mouth, slurred speech  . Allopurinol Other (See Comments)    Hand pain  . Simvastatin Other (See Comments)    myalgias  . Uloric [Febuxostat] Other (See Comments)    Stomach pain    Patient Measurements: Height: 6\' 2"  (188 cm) Weight: 179 lb 6.4 oz (81.4 kg) IBW/kg (Calculated) : 82.2 Heparin Dosing Weight:   Vital Signs: Temp: 97.8 F (36.6 C) (07/12 0816) Temp Source: Oral (07/12 0816) BP: 121/50 (07/12 0816) Pulse Rate: 89 (07/12 0816)  Labs:  Recent Labs  12/10/16 1659 12/11/16 0111 12/12/16 0250 12/12/16 1114  HGB 9.7* 8.7* 8.9*  --   HCT 28.7* 25.5* 26.2*  --   PLT 229 219 208  --   APTT 32 79* 69*  --   LABPROT 15.8*  --   --   --   INR 1.25  --   --   --   HEPARINUNFRC 1.00* 1.13* 0.68 0.97*  CREATININE 1.32* 1.35* 1.24  --     Estimated Creatinine Clearance: 52 mL/min (by C-G formula based on SCr of 1.24 mg/dL).   Medical History: Past Medical History:  Diagnosis Date  . Aphakia of right eye   . Arthritis   . Cancer ([place])    SKIN  . Enlarged prostate without lower urinary tract symptoms (luts)   . Esophageal cancer ([place])   . GERD (gastroesophageal reflux disease)   . Glaucoma   . Gout   . History of CVA (cerebrovascular accident)    Cerebral infarction Nov. 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 History of Doppler ultrasound    dopplers revealed mild left ICA stenosis which has remained stable, occluded left subclavian artery with retrograde left vertebral filling and moderately severe right subclavian artery stenosis. he has no symptoms of upper extermity claudication or subclavian steal symptoms.   . History of DVT (deep vein thrombosis)    right arm  . History of  stress test 09/02/2009   abnormal myocardial perfusion scan demonstrating an attenuation defect in the inferior region of the myocardium. No ischemia or infarct/scar is seen in the remaining myocardium. No prior study available for comparison. Abnormal myocardial study EF 79%  . Hyperlipemia   . Hypertension   . Macular degeneration   . Pulmonary embolus ([place])   . Stroke ([place])   . Subclavian artery stenosis (H[facility])     Medications:  Scheduled:  . amLODipine  2.5 mg Oral Daily  . apixaban  5 mg Oral BID  . atorvastatin  20 mg Oral Q breakfast  . dorzolamide  1 drop Both Eyes BID  . feeding supplement (OSMOLITE 1.5 CAL)  360 mL Per Tube QID  . feeding supplement (PRO-STAT SUGAR FREE 64)  30 mL Per Tube Daily  . finasteride  5 mg Oral Daily  . free water  120 mL Per Tube QID  . latanoprost  1 drop Both Eyes QHS  . magnesium oxide  400 mg Oral Daily  . metoprolol succinate  25 mg Oral Daily  . pantoprazole  40 mg Oral Daily  . sucralfate  1 g Oral TID AC & HS  . timolol  1 drop Both Eyes BID  . cyanocobalamin  500 mcg Oral Daily    Assessment:  Pt is a 83 year old male with a PMH of esophageal cancer, PE/DVT on apixaban at home. Pt had a PEG tube placed yesterday. He held his apixaban for 3 days prior to procedure and was given IV heparin before and after procedure. Pt now to be transitioned back to apixaban prior to d/c  Goal of Therapy:   Monitor platelets by anticoagulation protocol: Yes   Plan:  Resume home dose of apixaban 5mg  BID. Give first dose at the same time the drip is turned off.   Ramond Dial, Pharm.D, BCPS Clinical Pharmacist  12/12/2016,12:35 PM

## 2016-12-13 ENCOUNTER — Inpatient Hospital Stay: Payer: Medicare Other

## 2016-12-16 ENCOUNTER — Ambulatory Visit
Admission: RE | Admit: 2016-12-16 | Discharge: 2016-12-16 | Disposition: A | Discharge status: Home / Self Care | Payer: Medicare Other | Source: Ambulatory Visit | Attending: Radiation Oncology | Admitting: Radiation Oncology

## 2016-12-16 DIAGNOSIS — C155 Malignant neoplasm of lower third of esophagus: Secondary | ICD-10-CM | POA: Diagnosis not present

## 2016-12-16 DIAGNOSIS — Z510 Encounter for antineoplastic radiation therapy: Secondary | ICD-10-CM | POA: Insufficient documentation

## 2016-12-17 ENCOUNTER — Telehealth: Payer: Self-pay | Admitting: *Deleted

## 2016-12-17 DIAGNOSIS — Z510 Encounter for antineoplastic radiation therapy: Secondary | ICD-10-CM | POA: Diagnosis not present

## 2016-12-17 DIAGNOSIS — C155 Malignant neoplasm of lower third of esophagus: Secondary | ICD-10-CM

## 2016-12-17 NOTE — Telephone Encounter (Signed)
Left mesage on Amys phone with Dr Sharmaine Base response. I asked that she return my call

## 2016-12-17 NOTE — Telephone Encounter (Signed)
Home care nurse Amy called to report that the patient has been running low grade fevers of 100.1. Taking Tylenol for it. Has a g tube in place. Patient reports not feeling great, but not feeling really bad either. Lungs clear. Please advise

## 2016-12-17 NOTE — Telephone Encounter (Signed)
Amy returned call and states she will speak with wife and call me back

## 2016-12-17 NOTE — Telephone Encounter (Signed)
Patient has an apt on 7/19 with Dr. Rogue Bussing. MD states that if wife is concerned, pt could obtain a cxr and a u/a / lab-cbc, metb  today to r/o infection. Otherwise, have patient keep apts as scheduled.  I have entered these orders.

## 2016-12-18 NOTE — Telephone Encounter (Signed)
reviewed chart- pt/pt's wife has not yet called back to c ctr. Since we have not heard back from patient, msg sent to sch. Team to just add a lab encounter to patient's sch. Tomorrow.

## 2016-12-19 ENCOUNTER — Inpatient Hospital Stay: Payer: Medicare Other

## 2016-12-19 ENCOUNTER — Inpatient Hospital Stay (HOSPITAL_BASED_OUTPATIENT_CLINIC_OR_DEPARTMENT_OTHER): Payer: Medicare Other | Admitting: Internal Medicine

## 2016-12-19 VITALS — BP 159/65 | HR 76 | Temp 98.6°F | Resp 20 | Ht 74.0 in | Wt 171.6 lb

## 2016-12-19 DIAGNOSIS — Z923 Personal history of irradiation: Secondary | ICD-10-CM

## 2016-12-19 DIAGNOSIS — C7951 Secondary malignant neoplasm of bone: Secondary | ICD-10-CM

## 2016-12-19 DIAGNOSIS — R509 Fever, unspecified: Secondary | ICD-10-CM | POA: Diagnosis not present

## 2016-12-19 DIAGNOSIS — R05 Cough: Secondary | ICD-10-CM

## 2016-12-19 DIAGNOSIS — C155 Malignant neoplasm of lower third of esophagus: Secondary | ICD-10-CM

## 2016-12-19 DIAGNOSIS — Z79899 Other long term (current) drug therapy: Secondary | ICD-10-CM | POA: Diagnosis not present

## 2016-12-19 DIAGNOSIS — Z9221 Personal history of antineoplastic chemotherapy: Secondary | ICD-10-CM | POA: Diagnosis not present

## 2016-12-19 LAB — CBC WITH DIFFERENTIAL/PLATELET
Basophils Absolute: 0 10*3/uL (ref 0–0.1)
Basophils Relative: 0 %
EOS ABS: 0.1 10*3/uL (ref 0–0.7)
EOS PCT: 1 %
HCT: 25.3 % — ABNORMAL LOW (ref 40.0–52.0)
Hemoglobin: 8.5 g/dL — ABNORMAL LOW (ref 13.0–18.0)
LYMPHS ABS: 0.6 10*3/uL — AB (ref 1.0–3.6)
Lymphocytes Relative: 8 %
MCH: 31.1 pg (ref 26.0–34.0)
MCHC: 33.7 g/dL (ref 32.0–36.0)
MCV: 92.3 fL (ref 80.0–100.0)
MONO ABS: 0.7 10*3/uL (ref 0.2–1.0)
Monocytes Relative: 10 %
Neutro Abs: 5.3 10*3/uL (ref 1.4–6.5)
Neutrophils Relative %: 81 %
Platelets: 281 10*3/uL (ref 150–440)
RBC: 2.73 MIL/uL — ABNORMAL LOW (ref 4.40–5.90)
RDW: 14.4 % (ref 11.5–14.5)
WBC: 6.7 10*3/uL (ref 3.8–10.6)

## 2016-12-19 LAB — BRAIN NATRIURETIC PEPTIDE: B NATRIURETIC PEPTIDE 5: 398 pg/mL — AB (ref 0.0–100.0)

## 2016-12-19 LAB — BASIC METABOLIC PANEL
Anion gap: 9 (ref 5–15)
BUN: 59 mg/dL — AB (ref 6–20)
CALCIUM: 8.8 mg/dL — AB (ref 8.9–10.3)
CHLORIDE: 103 mmol/L (ref 101–111)
CO2: 26 mmol/L (ref 22–32)
CREATININE: 1.23 mg/dL (ref 0.61–1.24)
GFR calc Af Amer: >60 mL/min (ref >60)
GFR, EST NON AFRICAN AMERICAN: 52 mL/min — AB (ref >60)
Glucose, Bld: 160 mg/dL — ABNORMAL HIGH (ref 65–99)
Potassium: 4.4 mmol/L (ref 3.5–5.1)
SODIUM: 138 mmol/L (ref 135–145)

## 2016-12-19 NOTE — Progress Notes (Signed)
Nutrition Assessment   Reason for Assessment:  Patient with new feeding tube  ASSESSMENT:  83 year old male with esophageal cancer s/p chemotherapy and radiation therapy.  April 2018 with recurrence.  Patient with odynophagia, poor po intake.  PEG tube placed on 7/12 during hospital admission (18 French, 20cc of water in balloon). Past medical history of DVT and PE, CVA, macular degeneration.   Patient reports that he is tolerating 349ml of osmolite 1.5 (1 1/2 cans) QID.  Flushing tube with 14ml of water before and after administering tube feeding.  Also taking promod 33ml q daily.  Reports that he is taking minimal fluid orally during the day, sips.  Report no pain while infusing tube feeding, wife is helping him.  Reports soreness at site of PEG tube. Reports little bowel movement this am that was normal, yesterday nothing and day before loose stool.    Patient is giving medications via tube.    Nutrition Focused Physical Exam: deferred  Medications: reviewed  Labs: glucose 160, BUN 59, creatinine 1.23  Anthropometrics:   Height: 74 inches Weight: 171 lb 9.6 oz UBW: 190-200s BMI: 22  Noted 5/14 weighed 190 lb  10% weight loss in 2 months, significant   Estimated Energy Needs  Kcals: 2060-2220 calories/d Protein: 98-122 g/d Fluid: 2 L/d  NUTRITION DIAGNOSIS: Inadequate oral intake related to esophageal cancer and odynophagia as evidenced by weight loss of 10% in the last 2 months, eating < or equal to 50% of energy needs for > or equal to 5 days, and recent placement of feeding tube   MALNUTRITION DIAGNOSIS: Patient meets criteria for moderate malnutrition in context of chronic illness as evidenced by energy intake < 75 for > or equal to 1 month and percent weight loss   INTERVENTION:   Recommend continuing current tube feeding regimen of 318ml of osmolite 1.5 QID (6 cans per day).  Continue water flush of 69ml before and after feeding.  Continue promod daily for  additional protein.  Patient not taking much fluid orally so will need additional 552ml via tube (options 146ml TID between feedings or with medication).   Tube feeding provides 2230 calories, 99 g protein, 2140ml water (includes free water from formula, flushes) Encouraged patient to give medications separately via tube and flush with at least 67ml of water before and after.  Contact information given and patient to contact with questions    MONITORING, EVALUATION, GOAL: Patient will utilize PEG to maintain adequate nutrition and hydration   NEXT VISIT: Aug 6th after flush  Jaydi Bray B. Zenia Resides, [place], [place] Registered Dietitian [phone] (pager)

## 2016-12-19 NOTE — Progress Notes (Signed)
[facility] OFFICE PROGRESS NOTE  Patient Care Team: Chesley Noon, MD as PCP - General (Family Medicine) Manya Silvas, MD as Consulting Physician (Gastroenterology)  Cancer Staging No matching staging information was found for the patient.   Oncology History   # June 2017- Localized [? Stage II; no EUS]Distal Esophagus Adeno CA [Bx Dr.Elliot]; PET- no distant Mets; July 5th 2017-concurrent Carbo-Taxol- RT Vespasian.Sprague ]; OCT 18th PET- improved; DEC 2017- EGD- CR. April 2018-PET Recurrence- Bx- NEG [Dr.Elliot]  # LLL nodule- monitor for now [PET June 2017]  # CKD [creat 1.4-1.7]; Hx of stroke [facility] 2016]  June 2017-MOLECULAR STUDIES- Her 2- IHC-NEG; MMR-STABLE; 09/2016- PDL-NEGATIVE.      Malignant neoplasm of lower third of esophagus (HCC)      INTERVAL HISTORY:  Marcus [name] 83 y.o.  male pleasant patient with recently diagnosed metastatic esophageal cancer is here for follow-up. He is accompanied by his son/ wife.  In the interim patient was admitted to the hospital for PEG tube placement; he had a PEG tube placement-uneventful. Patient had a PET scan - that showed recurrent disease in the esophagus; also bilateral spread to the bone-bilateral scapula; eft posterior ribs. He has been evaluated by-radiation oncology- awaiting radiation.  Patient has been able to use the PEG tube without any difficulty.   however overall he feels poorly complains of shortness of breath; increasing cough. Low-grade fevers. Continues to have left rib pain. Taking Tylenol for pain. Not relieved.    REVIEW OF SYSTEMS:  A complete 10 point review of system is done which is negative except mentioned above/history of present illness.   PAST MEDICAL HISTORY :  Past Medical History:  Diagnosis Date  . Aphakia of right eye   . Arthritis   . Cancer ([place])    SKIN  . Enlarged prostate without lower urinary tract symptoms (luts)   . Esophageal cancer ([place])   . GERD  (gastroesophageal reflux disease)   . Glaucoma   . Gout   . History of CVA (cerebrovascular accident)    Cerebral infarction Nov. 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 History of Doppler ultrasound    dopplers revealed mild left ICA stenosis which has remained stable, occluded left subclavian artery with retrograde left vertebral filling and moderately severe right subclavian artery stenosis. he has no symptoms of upper extermity claudication or subclavian steal symptoms.   . History of DVT (deep vein thrombosis)    right arm  . History of stress test 09/02/2009   abnormal myocardial perfusion scan demonstrating an attenuation defect in the inferior region of the myocardium. No ischemia or infarct/scar is seen in the remaining myocardium. No prior study available for comparison. Abnormal myocardial study EF 79%  . Hyperlipemia   . Hypertension   . Macular degeneration   . Pulmonary embolus ([place])   . Stroke ([place])   . Subclavian artery stenosis (HCC)     PAST SURGICAL HISTORY :   Past Surgical History:  Procedure Laterality Date  . CATARACT EXTRACTION    . COLONOSCOPY  2002  . ESOPHAGOGASTRODUODENOSCOPY (EGD) WITH PROPOFOL N/A 11/10/2015   Procedure: ESOPHAGOGASTRODUODENOSCOPY (EGD) WITH PROPOFOL;  Surgeon: Manya Silvas, MD;  Location: [facility] ENDOSCOPY;  Service: Endoscopy;  Laterality: N/A;  . ESOPHAGOGASTRODUODENOSCOPY (EGD) WITH PROPOFOL N/A 04/29/2016   Procedure: ESOPHAGOGASTRODUODENOSCOPY (EGD) WITH PROPOFOL;  Surgeon: Manya Silvas, MD;  Location[facility] ENDOSCOPY;  Service: Endoscopy;  Laterality: N/A;  . ESOPHAGOGASTRODUODENOSCOPY (EGD) WITH PROPOFOL N/A  10/07/2016   Procedure: ESOPHAGOGASTRODUODENOSCOPY (EGD) WITH PROPOFOL;  Surgeon: Manya Silvas, MD;  Location: [facility] ENDOSCOPY;  Service: Endoscopy;  Laterality: N/A;  . ESOPHAGOGASTRODUODENOSCOPY (EGD) WITH PROPOFOL N/A 12/11/2016   Procedure: ESOPHAGOGASTRODUODENOSCOPY (EGD) WITH PROPOFOL;  Surgeon: Leonie Green, MD;  Location: [facility] ENDOSCOPY;  Service: Endoscopy;  Laterality: N/A;  . FRACTURE SURGERY    . MOHS SURGERY    . PEG PLACEMENT N/A 12/11/2016   Procedure: PERCUTANEOUS ENDOSCOPIC GASTROSTOMY (PEG) PLACEMENT;  Surgeon: Leonie Green, MD;  Location: [facility] ENDOSCOPY;  Service: Endoscopy;  Laterality: N/A;  . PERIPHERAL VASCULAR CATHETERIZATION N/A 11/29/2015   Procedure: Glori Luis Cath Insertion;  Surgeon: Algernon Huxley, MD;  Location: [place] CV LAB;  Service: Cardiovascular;  Laterality: N/A;    FAMILY HISTORY :   Family History  Problem Relation Age of Onset  . Heart disease Brother   . Cancer - Colon Sister   . Cancer Sister   . Other Brother        CABG    SOCIAL HISTORY:   Social History  Substance Use Topics  . Smoking status: Former Smoker    Packs/day: 1.00    Years: 40.00    Types: Cigarettes    Quit date: 06/03/1993  . Smokeless tobacco: Never Used  . Alcohol use No    ALLERGIES:  is allergic to codeine; allopurinol; simvastatin; and uloric [febuxostat].  MEDICATIONS:  Current Outpatient Prescriptions  Medication Sig Dispense Refill  . acetaminophen (TYLENOL) 500 MG tablet Take 500 mg by mouth every 6 (six) hours as needed.    Marland Kitchen amLODipine (NORVASC) 2.5 MG tablet Take 2.5 mg by mouth daily.  0  . apixaban (ELIQUIS) 5 MG TABS tablet Take 1 tablet (5 mg total) by mouth 2 (two) times daily. Start on 03/03/2016 60 tablet 0  . atorvastatin (LIPITOR) 20 MG tablet Take 1 tablet (20 mg total) by mouth daily at 6 PM. (Patient taking differently: Take 20 mg by mouth daily with breakfast. ) 30 tablet 1  . calcium carbonate (TUMS EX) 750 MG chewable tablet Chew 1 tablet by mouth daily.    . dorzolamide (TRUSOPT) 2 % ophthalmic solution Place 1 drop into both eyes 2 (two) times daily.    . finasteride (PROSCAR) 5 MG tablet Take 5 mg by mouth daily.  10  . latanoprost (XALATAN) 0.005 % ophthalmic solution Place 1 drop into both eyes at bedtime.    . metoprolol succinate  (TOPROL-XL) 25 MG 24 hr tablet TAKE ONE TABLET (25 MG TOTAL) BY MOUTH DAILY.    Marland Kitchen Nutritional Supplements (FEEDING SUPPLEMENT, OSMOLITE 1.5 CAL,) LIQD Place 360 mLs into feeding tube 4 (four) times daily. 180 Bottle 0  . sucralfate (CARAFATE) 1 g tablet Take 1 g by mouth 4 (four) times daily.    Marland Kitchen terazosin (HYTRIN) 10 MG capsule Take 10 mg by mouth at bedtime.  0  . timolol (TIMOPTIC) 0.5 % ophthalmic solution Place 1 drop into both eyes 2 (two) times daily.  3  . Water For Irrigation, Sterile (FREE WATER) SOLN Place 120 mLs into feeding tube 4 (four) times daily. 15000 mL 0  . furosemide (LASIX) 40 MG tablet Take 1 tablet (40 mg total) by mouth daily. 14 tablet 0  . Potassium Chloride ER 20 MEQ TBCR Take 20 mEq by mouth daily. 14 tablet 0  . Vitamin D, Ergocalciferol, (DRISDOL) 50000 UNITS CAPS Take 50,000 Units by mouth every 7 (seven) days.     No current facility-administered medications  for this visit.     PHYSICAL EXAMINATION: ECOG PERFORMANCE STATUS: 2 - Symptomatic, <50% confined to bed  BP (!) 159/65   Pulse 76   Temp 98.6 F (37 C) (Tympanic)   Resp 20   Ht 6' 2"  (1.88 m)   Wt 171 lb 9.6 oz (77.8 kg)   BMI 22.03 kg/m   Filed Weights   12/19/16 1217  Weight: 171 lb 9.6 oz (77.8 kg)    GENERAL: Well-nourished well-developed; Alert, no distress and comfortable.  Accompanied by his wife/ son. He is in a wheelchair. EYES: no pallor or icterus OROPHARYNX: no thrush or ulceration;   NECK: supple, no masses felt LYMPH:  no palpable lymphadenopathy in the cervical  or inguinal regions. LUNGS: clear to auscultation and  No wheeze or crackles.  HEART/CVS: regular rate & rhythm and no murmurs; 1 plus bil lower extremity edema ABDOMEN:abdomen soft, non-tender and normal bowel sounds Musculoskeletal:no cyanosis of digits and no clubbing; gouty changes noted  PSYCH: alert & oriented x 3 with fluent speech NEURO: no focal motor/sensory deficits SKIN:  no rashes;  LABORATORY  DATA:  I have reviewed the data as listed    Component Value Date/Time   NA 138 12/19/2016 1150   K 4.4 12/19/2016 1150   CL 103 12/19/2016 1150   CO2 26 12/19/2016 1150   GLUCOSE 160 (H) 12/19/2016 1150   BUN 59 (H) 12/19/2016 1150   CREATININE 1.23 12/19/2016 1150   CALCIUM 8.8 (L) 12/19/2016 1150   PROT 7.0 12/10/2016 1659   ALBUMIN 2.9 (L) 12/10/2016 1659   AST 15 12/10/2016 1659   ALT 9 (L) 12/10/2016 1659   ALKPHOS 120 12/10/2016 1659   BILITOT 0.5 12/10/2016 1659   GFRNONAA 52 (L) 12/19/2016 1150   GFRAA >60 12/19/2016 1150    No results found for: SPEP, UPEP  Lab Results  Component Value Date   WBC 6.9 12/24/2016   NEUTROABS 5.8 12/24/2016   HGB 7.5 (L) 12/24/2016   HCT 22.2 (L) 12/24/2016   MCV 92.0 12/24/2016   PLT 351 12/24/2016      Chemistry      Component Value Date/Time   NA 138 12/19/2016 1150   K 4.4 12/19/2016 1150   CL 103 12/19/2016 1150   CO2 26 12/19/2016 1150   BUN 59 (H) 12/19/2016 1150   CREATININE 1.23 12/19/2016 1150      Component Value Date/Time   CALCIUM 8.8 (L) 12/19/2016 1150   ALKPHOS 120 12/10/2016 1659   AST 15 12/10/2016 1659   ALT 9 (L) 12/10/2016 1659   BILITOT 0.5 12/10/2016 1659       RADIOGRAPHIC STUDIES: I have personally reviewed the radiological images as listed and agreed with the findings in the report. No results found.   IMPRESSION: 1. 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 2. Increased in metabolic activity at the GE junction lesion. There is new fluid retention within the esophagus. 3. Focal activity in the midline cerebellum is indeterminate. Recommend MRI brain with contrast to exclude brain metastasis. 4. Potential soft tissue metastasis to the LEFT gluteus muscle. Indeterminate. 5. Stable pulmonary nodules without metabolic activity.   Electronically Signed   By: Suzy Bouchard M.D.   On: 12/10/2016 17:38  ASSESSMENT & PLAN:  Malignant  neoplasm of lower third of esophagus (HCC) # Metastatic esophageal cancer- local recurrence in the esophagus/ metastases to the bone -bilateral scapula/left ribs- which is painful [C discussion below]  #  Reviewed with the patient family the seriousness of the findings on the PET scan. The disease cannot be cured/palliative treatments could be offered.# Discussed with the patient's son that in general metastatic esophagus cancer median survival is approximately 8-12 months. Given the frailty /multiple comorbidities- patient is less likely to reach the median.   # Systemic therapy would include- 5-FU infusion [I do not think patient can tolerate oxaliplatin] versus Taxol on progression. Not a candidate for cyramza [hx of stroke; ? Bleeding esophgeal ulcer]; NEG- her 2 NEU; NEG- PDL-1. Discussed with patient and family the limited treatment options.  However at the given time patient feels poorly/ frail even to offer 5-FU infusions. We'll reevaluate in next 2 weeks.   #  PE- Oletta Darter 2017]- currently on Eliquis; continue the same.   # Cough/low-grade fevers- check chest x-ray UA and urine culture.   # addendum: I discussed with the patient's primary physician Dr.Badger the poor prognosis. Also reviewed the concerns regarding recent anemia- hemoglobin 7.5; will plan transfusion 2 units.    Orders Placed This Encounter  Procedures  . Brain natriuretic peptide    Standing Status:   Future    Number of Occurrences:   1    Standing Expiration Date:   12/19/2017   All questions were answered. The patient knows to call the clinic with any problems, questions or concerns.      Cammie Sickle, MD 12/24/2016 5:28 PM

## 2016-12-19 NOTE — Assessment & Plan Note (Addendum)
#  Metastatic esophageal cancer- local recurrence in the esophagus/ metastases to the bone -bilateral scapula/left ribs- which is painful [C discussion below]  # Reviewed with the patient family the seriousness of the findings on the PET scan. The disease cannot be cured/palliative treatments could be offered.# Discussed with the patient's son that in general metastatic esophagus cancer median survival is approximately 8-12 months. Given the frailty /multiple comorbidities- patient is less likely to reach the median.   # Systemic therapy would include- 5-FU infusion [I do not think patient can tolerate oxaliplatin] versus Taxol on progression. Not a candidate for cyramza [hx of stroke; ? Bleeding esophgeal ulcer]; NEG- her 2 NEU; NEG- PDL-1. Discussed with patient and family the limited treatment options.  However at the given time patient feels poorly/ frail even to offer 5-FU infusions. We'll reevaluate in next 2 weeks.   #  PE- Oletta Darter 2017]- currently on Eliquis; continue the same.   # Cough/low-grade fevers- check chest x-ray UA and urine culture.   # addendum: I discussed with the patient's primary physician Dr.Badger the poor prognosis. Also reviewed the concerns regarding recent anemia- hemoglobin 7.5; will plan transfusion 2 units.

## 2016-12-20 ENCOUNTER — Ambulatory Visit
Admission: RE | Admit: 2016-12-20 | Discharge: 2016-12-20 | Disposition: A | Discharge status: Home / Self Care | Payer: Medicare Other | Source: Ambulatory Visit | Attending: Internal Medicine | Admitting: Internal Medicine

## 2016-12-20 ENCOUNTER — Telehealth: Payer: Self-pay | Admitting: *Deleted

## 2016-12-20 ENCOUNTER — Other Ambulatory Visit: Payer: Self-pay

## 2016-12-20 DIAGNOSIS — J90 Pleural effusion, not elsewhere classified: Secondary | ICD-10-CM | POA: Diagnosis not present

## 2016-12-20 DIAGNOSIS — C155 Malignant neoplasm of lower third of esophagus: Secondary | ICD-10-CM

## 2016-12-20 DIAGNOSIS — I700 Atherosclerosis of aorta: Secondary | ICD-10-CM | POA: Insufficient documentation

## 2016-12-20 DIAGNOSIS — Q2546 Tortuous aortic arch: Secondary | ICD-10-CM | POA: Insufficient documentation

## 2016-12-20 LAB — URINALYSIS, COMPLETE (UACMP) WITH MICROSCOPIC
BACTERIA UA: NONE SEEN
Bilirubin Urine: NEGATIVE
GLUCOSE, UA: NEGATIVE mg/dL
Hgb urine dipstick: NEGATIVE
KETONES UR: NEGATIVE mg/dL
Leukocytes, UA: NEGATIVE
Nitrite: NEGATIVE
PH: 7 (ref 5.0–8.0)
Protein, ur: NEGATIVE mg/dL
SPECIFIC GRAVITY, URINE: 1.017 (ref 1.005–1.030)

## 2016-12-20 MED ORDER — FUROSEMIDE 40 MG PO TABS: 40.0000 mg | ORAL_TABLET | Freq: Every day | ORAL | 0 refills | Status: DC

## 2016-12-20 MED ORDER — POTASSIUM CHLORIDE ER 20 MEQ PO TBCR: 20.0000 meq | EXTENDED_RELEASE_TABLET | Freq: Every day | ORAL | 0 refills | Status: DC

## 2016-12-20 NOTE — Telephone Encounter (Signed)
Per VO Dr Dr B, looks like CHF, Lasix 40 mg daily times 2 weeks, K Dur 20 meq daily times 2 weeks.  Lennette Bihari notified and confirmed [facility] as pharmacy

## 2016-12-23 ENCOUNTER — Ambulatory Visit
Admission: RE | Admit: 2016-12-23 | Discharge: 2016-12-23 | Disposition: A | Discharge status: Home / Self Care | Payer: Medicare Other | Source: Ambulatory Visit | Attending: Radiation Oncology | Admitting: Radiation Oncology

## 2016-12-23 DIAGNOSIS — Z510 Encounter for antineoplastic radiation therapy: Secondary | ICD-10-CM | POA: Diagnosis not present

## 2016-12-24 ENCOUNTER — Other Ambulatory Visit: Payer: Self-pay | Admitting: *Deleted

## 2016-12-24 ENCOUNTER — Telehealth: Payer: Self-pay | Admitting: Internal Medicine

## 2016-12-24 ENCOUNTER — Inpatient Hospital Stay: Payer: Medicare Other

## 2016-12-24 DIAGNOSIS — D649 Anemia, unspecified: Secondary | ICD-10-CM

## 2016-12-24 DIAGNOSIS — C155 Malignant neoplasm of lower third of esophagus: Secondary | ICD-10-CM | POA: Diagnosis not present

## 2016-12-24 LAB — CBC WITH DIFFERENTIAL/PLATELET
BASOS ABS: 0.1 10*3/uL (ref 0–0.1)
BASOS PCT: 2 %
EOS ABS: 0.1 10*3/uL (ref 0–0.7)
Eosinophils Relative: 1 %
HEMATOCRIT: 22.2 % — AB (ref 40.0–52.0)
HEMOGLOBIN: 7.5 g/dL — AB (ref 13.0–18.0)
Lymphocytes Relative: 6 %
Lymphs Abs: 0.4 10*3/uL — ABNORMAL LOW (ref 1.0–3.6)
MCH: 31.2 pg (ref 26.0–34.0)
MCHC: 33.9 g/dL (ref 32.0–36.0)
MCV: 92 fL (ref 80.0–100.0)
Monocytes Absolute: 0.5 10*3/uL (ref 0.2–1.0)
Monocytes Relative: 7 %
NEUTROS ABS: 5.8 10*3/uL (ref 1.4–6.5)
Neutrophils Relative %: 84 %
Platelets: 351 10*3/uL (ref 150–440)
RBC: 2.41 MIL/uL — ABNORMAL LOW (ref 4.40–5.90)
RDW: 15.4 % — ABNORMAL HIGH (ref 11.5–14.5)
WBC: 6.9 10*3/uL (ref 3.8–10.6)

## 2016-12-24 LAB — ABO/RH: ABO/RH(D): A POS

## 2016-12-24 NOTE — Telephone Encounter (Signed)
Notified pt. Results/plan  Patient scheduled for labs today (type/screen,cbc,stool cards X2)   Patient scheduled for transfusion tomorrow morning at 9am.

## 2016-12-24 NOTE — Telephone Encounter (Signed)
Please see phone note for scheduling orders for blood transfusion

## 2016-12-24 NOTE — Telephone Encounter (Signed)
#   Spoke to patient's PCP Dr. Melford Aase- CBC in the office 7.7; patient symptomatic.  # Recommend 2 units of PRBC transfusion; type and cross; CBC hold tube ASAP. Also order stool occult x2- to see if pt is bleeding from the esophageal tumor.

## 2016-12-25 ENCOUNTER — Inpatient Hospital Stay: Payer: Medicare Other

## 2016-12-25 DIAGNOSIS — D649 Anemia, unspecified: Secondary | ICD-10-CM

## 2016-12-25 DIAGNOSIS — C155 Malignant neoplasm of lower third of esophagus: Secondary | ICD-10-CM | POA: Diagnosis not present

## 2016-12-25 LAB — PREPARE RBC (CROSSMATCH)

## 2016-12-25 MED ORDER — DIPHENHYDRAMINE HCL 50 MG/ML IJ SOLN
25.0000 mg | Freq: Once | INTRAMUSCULAR | Status: AC
  Administered 2016-12-25: 25 mg via INTRAVENOUS
  Filled 2016-12-25: qty 1

## 2016-12-25 MED ORDER — SODIUM CHLORIDE 0.9% FLUSH
10.0000 mL | INTRAVENOUS | Status: DC | PRN
  Filled 2016-12-25: qty 10

## 2016-12-25 MED ORDER — HEPARIN SOD (PORK) LOCK FLUSH 100 UNIT/ML IV SOLN
500.0000 [IU] | Freq: Every day | INTRAVENOUS | Status: AC | PRN
  Administered 2016-12-25: 500 [IU]
  Filled 2016-12-25: qty 5

## 2016-12-25 MED ORDER — DIPHENHYDRAMINE HCL 25 MG PO CAPS
25.0000 mg | ORAL_CAPSULE | Freq: Once | ORAL | Status: DC
  Filled 2016-12-25: qty 1

## 2016-12-25 MED ORDER — SODIUM CHLORIDE 0.9 % IV SOLN
250.0000 mL | Freq: Once | INTRAVENOUS | Status: AC
  Administered 2016-12-25: 250 mL via INTRAVENOUS
  Filled 2016-12-25: qty 250

## 2016-12-25 MED ORDER — ACETAMINOPHEN 325 MG PO TABS
650.0000 mg | ORAL_TABLET | Freq: Once | ORAL | Status: DC
  Filled 2016-12-25: qty 2

## 2016-12-26 DIAGNOSIS — C155 Malignant neoplasm of lower third of esophagus: Secondary | ICD-10-CM | POA: Diagnosis not present

## 2016-12-26 LAB — BPAM RBC
Blood Product Expiration Date: 201808102359
Blood Product Expiration Date: 201808102359
ISSUE DATE / TIME: 201807250935
ISSUE DATE / TIME: 201807251138
UNIT TYPE AND RH: 6200
Unit Type and Rh: 6200

## 2016-12-26 LAB — TYPE AND SCREEN
ABO/RH(D): A POS
ANTIBODY SCREEN: NEGATIVE
UNIT DIVISION: 0
Unit division: 0

## 2016-12-27 ENCOUNTER — Telehealth: Payer: Self-pay | Admitting: *Deleted

## 2016-12-27 DIAGNOSIS — E876 Hypokalemia: Secondary | ICD-10-CM

## 2016-12-27 MED ORDER — POTASSIUM CHLORIDE 20 MEQ/15ML (10%) PO SOLN: 20.0000 meq | Freq: Every day | ORAL | 0 refills | Status: DC

## 2016-12-27 NOTE — Telephone Encounter (Signed)
Chrystal at [facility] stated wife was requesting lqd potassium instead of oral tablet potassium. - spoke with Dr. Rogue Bussing who approved lqd potassium to be sent to pharmacy.  Patient was instructed by home health nurse not to crush his Eliquis as he had been doing (for safety reasons). Patient needs coordination of his appts in August, so that he doesn't need to go come twice in that week to see md.

## 2016-12-30 ENCOUNTER — Other Ambulatory Visit: Payer: Self-pay

## 2016-12-30 DIAGNOSIS — D649 Anemia, unspecified: Secondary | ICD-10-CM

## 2016-12-30 LAB — OCCULT BLOOD X 1 CARD TO LAB, STOOL
Fecal Occult Bld: NEGATIVE
Fecal Occult Bld: POSITIVE — AB

## 2016-12-31 ENCOUNTER — Telehealth: Payer: Self-pay | Admitting: *Deleted

## 2016-12-31 DIAGNOSIS — R1110 Vomiting, unspecified: Secondary | ICD-10-CM

## 2016-12-31 DIAGNOSIS — C155 Malignant neoplasm of lower third of esophagus: Secondary | ICD-10-CM

## 2016-12-31 DIAGNOSIS — R112 Nausea with vomiting, unspecified: Secondary | ICD-10-CM

## 2016-12-31 DIAGNOSIS — IMO0001 Reserved for inherently not codable concepts without codable children: Secondary | ICD-10-CM

## 2016-12-31 MED ORDER — METOCLOPRAMIDE HCL 10 MG/10ML PO SOLN: 10.0000 mg | Freq: Three times a day (TID) | ORAL | 3 refills | Status: DC

## 2016-12-31 NOTE — Telephone Encounter (Signed)
Marcus [name] home health nurse at [facility]. Reports that pt is "regurgitating tube feeding." She witnessed this event while she was rounding on the patient today. The "vomit smells like tube feeding." pt's wife is administering the tube feeding via bolus/gravity- 360 ml per day.  Rn spoke with Dr. Rogue Bussing - v/o to send rx for reglan 10 mg/ ml - Place 10 mLs (10 mg total) into feeding tube 3 (three) times daily 1 hr before meals. rx sent to pt's pharmacy.

## 2016-12-31 NOTE — Telephone Encounter (Signed)
Returned phone call to home health nurse. Plan of care discussed. She will inform patientl

## 2017-01-06 ENCOUNTER — Inpatient Hospital Stay: Payer: Medicare Other

## 2017-01-06 ENCOUNTER — Encounter: Payer: Self-pay | Admitting: Nurse Practitioner

## 2017-01-06 ENCOUNTER — Inpatient Hospital Stay: Payer: Medicare Other | Attending: Internal Medicine | Admitting: Internal Medicine

## 2017-01-06 ENCOUNTER — Telehealth: Payer: Self-pay | Admitting: *Deleted

## 2017-01-06 ENCOUNTER — Other Ambulatory Visit: Payer: Self-pay | Admitting: *Deleted

## 2017-01-06 ENCOUNTER — Telehealth: Payer: Self-pay | Admitting: Internal Medicine

## 2017-01-06 ENCOUNTER — Ambulatory Visit: Payer: Medicare Other

## 2017-01-06 VITALS — BP 118/60 | HR 77 | Temp 97.6°F | Resp 20 | Ht 74.0 in | Wt 172.0 lb

## 2017-01-06 DIAGNOSIS — Z923 Personal history of irradiation: Secondary | ICD-10-CM | POA: Diagnosis not present

## 2017-01-06 DIAGNOSIS — Z8673 Personal history of transient ischemic attack (TIA), and cerebral infarction without residual deficits: Secondary | ICD-10-CM | POA: Diagnosis not present

## 2017-01-06 DIAGNOSIS — D649 Anemia, unspecified: Secondary | ICD-10-CM

## 2017-01-06 DIAGNOSIS — M1A9XX0 Chronic gout, unspecified, without tophus (tophi): Secondary | ICD-10-CM | POA: Insufficient documentation

## 2017-01-06 DIAGNOSIS — E785 Hyperlipidemia, unspecified: Secondary | ICD-10-CM | POA: Diagnosis not present

## 2017-01-06 DIAGNOSIS — C7951 Secondary malignant neoplasm of bone: Secondary | ICD-10-CM

## 2017-01-06 DIAGNOSIS — D509 Iron deficiency anemia, unspecified: Secondary | ICD-10-CM

## 2017-01-06 DIAGNOSIS — Z86718 Personal history of other venous thrombosis and embolism: Secondary | ICD-10-CM | POA: Diagnosis not present

## 2017-01-06 DIAGNOSIS — K219 Gastro-esophageal reflux disease without esophagitis: Secondary | ICD-10-CM | POA: Insufficient documentation

## 2017-01-06 DIAGNOSIS — C155 Malignant neoplasm of lower third of esophagus: Secondary | ICD-10-CM | POA: Diagnosis present

## 2017-01-06 DIAGNOSIS — Z87891 Personal history of nicotine dependence: Secondary | ICD-10-CM | POA: Diagnosis not present

## 2017-01-06 DIAGNOSIS — H3530 Unspecified macular degeneration: Secondary | ICD-10-CM | POA: Insufficient documentation

## 2017-01-06 DIAGNOSIS — N400 Enlarged prostate without lower urinary tract symptoms: Secondary | ICD-10-CM | POA: Insufficient documentation

## 2017-01-06 DIAGNOSIS — H409 Unspecified glaucoma: Secondary | ICD-10-CM | POA: Diagnosis not present

## 2017-01-06 DIAGNOSIS — I129 Hypertensive chronic kidney disease with stage 1 through stage 4 chronic kidney disease, or unspecified chronic kidney disease: Secondary | ICD-10-CM | POA: Insufficient documentation

## 2017-01-06 DIAGNOSIS — Z86711 Personal history of pulmonary embolism: Secondary | ICD-10-CM | POA: Diagnosis not present

## 2017-01-06 DIAGNOSIS — Z79899 Other long term (current) drug therapy: Secondary | ICD-10-CM | POA: Diagnosis not present

## 2017-01-06 DIAGNOSIS — M109 Gout, unspecified: Secondary | ICD-10-CM | POA: Insufficient documentation

## 2017-01-06 DIAGNOSIS — Z7901 Long term (current) use of anticoagulants: Secondary | ICD-10-CM | POA: Diagnosis not present

## 2017-01-06 DIAGNOSIS — R197 Diarrhea, unspecified: Secondary | ICD-10-CM | POA: Diagnosis not present

## 2017-01-06 LAB — CBC WITH DIFFERENTIAL/PLATELET
BASOS ABS: 0 10*3/uL (ref 0–0.1)
Basophils Relative: 0 %
Eosinophils Absolute: 0.1 10*3/uL (ref 0–0.7)
Eosinophils Relative: 1 %
HCT: 26.2 % — ABNORMAL LOW (ref 40.0–52.0)
Hemoglobin: 8.8 g/dL — ABNORMAL LOW (ref 13.0–18.0)
LYMPHS PCT: 7 %
Lymphs Abs: 0.6 10*3/uL — ABNORMAL LOW (ref 1.0–3.6)
MCH: 29.9 pg (ref 26.0–34.0)
MCHC: 33.5 g/dL (ref 32.0–36.0)
MCV: 89.3 fL (ref 80.0–100.0)
MONO ABS: 0.6 10*3/uL (ref 0.2–1.0)
Monocytes Relative: 8 %
NEUTROS ABS: 7.1 10*3/uL — AB (ref 1.4–6.5)
NEUTROS PCT: 84 %
PLATELETS: 279 10*3/uL (ref 150–440)
RBC: 2.93 MIL/uL — ABNORMAL LOW (ref 4.40–5.90)
RDW: 16.5 % — ABNORMAL HIGH (ref 11.5–14.5)
WBC: 8.5 10*3/uL (ref 3.8–10.6)

## 2017-01-06 LAB — BASIC METABOLIC PANEL
Anion gap: 11 (ref 5–15)
BUN: 70 mg/dL — ABNORMAL HIGH (ref 6–20)
CHLORIDE: 104 mmol/L (ref 101–111)
CO2: 20 mmol/L — ABNORMAL LOW (ref 22–32)
CREATININE: 1.51 mg/dL — AB (ref 0.61–1.24)
Calcium: 8.5 mg/dL — ABNORMAL LOW (ref 8.9–10.3)
GFR calc Af Amer: 47 mL/min — ABNORMAL LOW (ref >60)
GFR, EST NON AFRICAN AMERICAN: 41 mL/min — AB (ref >60)
GLUCOSE: 166 mg/dL — AB (ref 65–99)
POTASSIUM: 4.4 mmol/L (ref 3.5–5.1)
Sodium: 135 mmol/L (ref 135–145)

## 2017-01-06 LAB — IRON AND TIBC
Iron: 26 ug/dL — ABNORMAL LOW (ref 45–182)
SATURATION RATIOS: 9 % — AB (ref 17.9–39.5)
TIBC: 293 ug/dL (ref 250–450)
UIBC: 267 ug/dL

## 2017-01-06 LAB — FERRITIN: Ferritin: 223 ng/mL (ref 24–336)

## 2017-01-06 LAB — SAMPLE TO BLOOD BANK

## 2017-01-06 MED ORDER — OXYCODONE HCL 5 MG/5ML PO SOLN: ORAL | 0 refills | Status: AC

## 2017-01-06 NOTE — Telephone Encounter (Signed)
msg sent to sch. team 

## 2017-01-06 NOTE — Progress Notes (Signed)
[place] OFFICE PROGRESS NOTE  Patient Care Team: Chesley Noon, MD as PCP - General (Family Medicine) Manya Silvas, MD as Consulting Physician (Gastroenterology)  Cancer Staging No matching staging information was found for the patient.   Oncology History   # June 2017- Localized [? Stage II; no EUS]Distal Esophagus Adeno CA [Bx Dr.Elliot]; PET- no distant Mets; July 5th 2017-concurrent Carbo-Taxol- RT Vespasian.Sprague ]; OCT 18th PET- improved; DEC 2017- EGD- CR. April 2018-PET Recurrence- Bx- NEG [Dr.Elliot]  # LLL nodule- monitor for now [PET June 2017]  # CKD [creat 1.4-1.7]; Hx of stroke [facility] 2016]  June 2017-MOLECULAR STUDIES- Her 2- IHC-NEG; MMR-STABLE; 09/2016- PDL-NEGATIVE.      Malignant neoplasm of lower third of esophagus (HCC)      INTERVAL HISTORY:  Marcus [name] 83 y.o.  male pleasant patient with recently diagnosed metastatic esophageal cancer is here for follow-up. He is accompanied by his son/ wife.  Patient interim had his radiation to his left scapula for metastatic disease. In the interim patient also 2 units of PRBC transfusion-4 hemoglobin is 7.5 without any significant improvement of his fatigue.  Patient has not noticed significant improvement of the pain; he has severe pain lasted period but unfortunately not taking any pain medications.  Patient complains of "severe" diarrhea- 4-6 loose stools a day. He has not been taking any Imodium. This has been going on for the last "many weeks". Unfortunately, he has never mentioned that to me prior.    patient overall continues to feel poorly. Resting most of the time. Patient denies any blood in stools or black colored stools.   REVIEW OF SYSTEMS:  A complete 10 point review of system is done which is negative except mentioned above/history of present illness.   PAST MEDICAL HISTORY :  Past Medical History:  Diagnosis Date  . Aphakia of right eye   . Arthritis   . Cancer ([place])     SKIN  . Enlarged prostate without lower urinary tract symptoms (luts)   . Esophageal cancer ([place])   . GERD (gastroesophageal reflux disease)   . Glaucoma   . Gout   . History of CVA (cerebrovascular accident)    Cerebral infarction Nov. 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 History of Doppler ultrasound    dopplers revealed mild left ICA stenosis which has remained stable, occluded left subclavian artery with retrograde left vertebral filling and moderately severe right subclavian artery stenosis. he has no symptoms of upper extermity claudication or subclavian steal symptoms.   . History of DVT (deep vein thrombosis)    right arm  . History of stress test 09/02/2009   abnormal myocardial perfusion scan demonstrating an attenuation defect in the inferior region of the myocardium. No ischemia or infarct/scar is seen in the remaining myocardium. No prior study available for comparison. Abnormal myocardial study EF 79%  . Hyperlipemia   . Hypertension   . Macular degeneration   . Pulmonary embolus ([place])   . Stroke ([facility])   . Subclavian artery stenosis (HCC)     PAST SURGICAL HISTORY :   Past Surgical History:  Procedure Laterality Date  . CATARACT EXTRACTION    . COLONOSCOPY  2002  . ESOPHAGOGASTRODUODENOSCOPY (EGD) WITH PROPOFOL N/A 11/10/2015   Procedure: ESOPHAGOGASTRODUODENOSCOPY (EGD) WITH PROPOFOL;  Surgeon: Manya Silvas, MD;  Location: [facility] ENDOSCOPY;  Service: Endoscopy;  Laterality: N/A;  . ESOPHAGOGASTRODUODENOSCOPY (EGD) WITH PROPOFOL N/A 04/29/2016   Procedure: ESOPHAGOGASTRODUODENOSCOPY (EGD) WITH  PROPOFOL;  Surgeon: Manya Silvas, MD;  Location: [facility] ENDOSCOPY;  Service: Endoscopy;  Laterality: N/A;  . ESOPHAGOGASTRODUODENOSCOPY (EGD) WITH PROPOFOL N/A 10/07/2016   Procedure: ESOPHAGOGASTRODUODENOSCOPY (EGD) WITH PROPOFOL;  Surgeon: Manya Silvas, MD;  Location: [facility] ENDOSCOPY;  Service: Endoscopy;  Laterality: N/A;  . ESOPHAGOGASTRODUODENOSCOPY (EGD) WITH  PROPOFOL N/A 12/11/2016   Procedure: ESOPHAGOGASTRODUODENOSCOPY (EGD) WITH PROPOFOL;  Surgeon: Leonie Green, MD;  Location: [facility] ENDOSCOPY;  Service: Endoscopy;  Laterality: N/A;  . FRACTURE SURGERY    . MOHS SURGERY    . PEG PLACEMENT N/A 12/11/2016   Procedure: PERCUTANEOUS ENDOSCOPIC GASTROSTOMY (PEG) PLACEMENT;  Surgeon: Leonie Green, MD;  Location: [facility] ENDOSCOPY;  Service: Endoscopy;  Laterality: N/A;  . PERIPHERAL VASCULAR CATHETERIZATION N/A 11/29/2015   Procedure: Glori Luis Cath Insertion;  Surgeon: Algernon Huxley, MD;  Location: [place] CV LAB;  Service: Cardiovascular;  Laterality: N/A;    FAMILY HISTORY :   Family History  Problem Relation Age of Onset  . Heart disease Brother   . Cancer - Colon Sister   . Cancer Sister   . Other Brother        CABG    SOCIAL HISTORY:   Social History  Substance Use Topics  . Smoking status: Former Smoker    Packs/day: 1.00    Years: 40.00    Types: Cigarettes    Quit date: 06/03/1993  . Smokeless tobacco: Never Used  . Alcohol use No    ALLERGIES:  is allergic to codeine; allopurinol; simvastatin; and uloric [febuxostat].  MEDICATIONS:  Current Outpatient Prescriptions  Medication Sig Dispense Refill  . amLODipine (NORVASC) 2.5 MG tablet Take 2.5 mg by mouth daily.  0  . apixaban (ELIQUIS) 5 MG TABS tablet Take 1 tablet (5 mg total) by mouth 2 (two) times daily. Start on 03/03/2016 60 tablet 0  . atorvastatin (LIPITOR) 20 MG tablet Take 1 tablet (20 mg total) by mouth daily at 6 PM. (Patient taking differently: Take 20 mg by mouth daily with breakfast. ) 30 tablet 1  . dorzolamide (TRUSOPT) 2 % ophthalmic solution Place 1 drop into both eyes 2 (two) times daily.    . finasteride (PROSCAR) 5 MG tablet Take 5 mg by mouth daily.  10  . latanoprost (XALATAN) 0.005 % ophthalmic solution Place 1 drop into both eyes at bedtime.    . metoprolol succinate (TOPROL-XL) 25 MG 24 hr tablet TAKE ONE TABLET (25 MG TOTAL) BY MOUTH  DAILY.    Marland Kitchen Nutritional Supplements (FEEDING SUPPLEMENT, OSMOLITE 1.5 CAL,) LIQD Place 360 mLs into feeding tube 4 (four) times daily. 180 Bottle 0  . terazosin (HYTRIN) 10 MG capsule Take 10 mg by mouth at bedtime.  0  . timolol (TIMOPTIC) 0.5 % ophthalmic solution Place 1 drop into both eyes 2 (two) times daily.  3  . vitamin B-12 (CYANOCOBALAMIN) 500 MCG tablet Take 500 mcg by mouth daily.    . Vitamin D, Ergocalciferol, (DRISDOL) 50000 UNITS CAPS Take 50,000 Units by mouth every 7 (seven) days.    . Water For Irrigation, Sterile (FREE WATER) SOLN Place 120 mLs into feeding tube 4 (four) times daily. 15000 mL 0  . acetaminophen (TYLENOL) 500 MG tablet Take 500 mg by mouth every 6 (six) hours as needed.    . calcium carbonate (TUMS EX) 750 MG chewable tablet Chew 1 tablet by mouth daily.    . metoCLOPramide (REGLAN) 10 MG/10ML SOLN Place 10 mLs (10 mg total) into feeding tube 3 (three) times daily  before meals. (Patient not taking: Reported on 01/06/2017) 600 mL 3  . oxyCODONE (ROXICODONE) 5 MG/5ML solution 5 ml Every 6-8 hours as needed for pain thu PEG tube. 120 mL 0  . sucralfate (CARAFATE) 1 g tablet Take 1 g by mouth 4 (four) times daily.     No current facility-administered medications for this visit.     PHYSICAL EXAMINATION: ECOG PERFORMANCE STATUS: 2 - Symptomatic, <50% confined to bed  BP 118/60   Pulse 77   Temp 97.6 F (36.4 C) (Tympanic)   Resp 20   Ht 6' 2"  (1.88 m)   Wt 172 lb (78 kg)   BMI 22.08 kg/m   Filed Weights   01/06/17 1429  Weight: 172 lb (78 kg)    GENERAL: Well-nourished well-developed; Alert, no distress and comfortable.  Accompanied by his wife/ son. He is in a wheelchair. EYES: no pallor or icterus OROPHARYNX: no thrush or ulceration;   NECK: supple, no masses felt LYMPH:  no palpable lymphadenopathy in the cervical  or inguinal regions. LUNGS: clear to auscultation and  No wheeze or crackles.  HEART/CVS: regular rate & rhythm and no murmurs; 1  plus bil lower extremity edema ABDOMEN:abdomen soft, non-tender and normal bowel sounds Musculoskeletal:no cyanosis of digits and no clubbing; gouty changes noted  PSYCH: alert & oriented x 3 with fluent speech NEURO: no focal motor/sensory deficits SKIN:  no rashes;  LABORATORY DATA:  I have reviewed the data as listed    Component Value Date/Time   NA 135 01/06/2017 1359   K 4.4 01/06/2017 1359   CL 104 01/06/2017 1359   CO2 20 (L) 01/06/2017 1359   GLUCOSE 166 (H) 01/06/2017 1359   BUN 70 (H) 01/06/2017 1359   CREATININE 1.51 (H) 01/06/2017 1359   CALCIUM 8.5 (L) 01/06/2017 1359   PROT 7.0 12/10/2016 1659   ALBUMIN 2.9 (L) 12/10/2016 1659   AST 15 12/10/2016 1659   ALT 9 (L) 12/10/2016 1659   ALKPHOS 120 12/10/2016 1659   BILITOT 0.5 12/10/2016 1659   GFRNONAA 41 (L) 01/06/2017 1359   GFRAA 47 (L) 01/06/2017 1359    No results found for: SPEP, UPEP  Lab Results  Component Value Date   WBC 8.5 01/06/2017   NEUTROABS 7.1 (H) 01/06/2017   HGB 8.8 (L) 01/06/2017   HCT 26.2 (L) 01/06/2017   MCV 89.3 01/06/2017   PLT 279 01/06/2017      Chemistry      Component Value Date/Time   NA 135 01/06/2017 1359   K 4.4 01/06/2017 1359   CL 104 01/06/2017 1359   CO2 20 (L) 01/06/2017 1359   BUN 70 (H) 01/06/2017 1359   CREATININE 1.51 (H) 01/06/2017 1359      Component Value Date/Time   CALCIUM 8.5 (L) 01/06/2017 1359   ALKPHOS 120 12/10/2016 1659   AST 15 12/10/2016 1659   ALT 9 (L) 12/10/2016 1659   BILITOT 0.5 12/10/2016 1659       RADIOGRAPHIC STUDIES: I have personally reviewed the radiological images as listed and agreed with the findings in the report. No results found.   IMPRESSION: 1. 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 2. Increased in metabolic activity at the GE junction lesion. There is new fluid retention within the esophagus. 3. Focal activity in the midline cerebellum is  indeterminate. Recommend MRI brain with contrast to exclude brain metastasis. 4. Potential soft tissue metastasis to the LEFT gluteus muscle. Indeterminate. 5.  Stable pulmonary nodules without metabolic activity.   Electronically Signed   By: Suzy Bouchard M.D.   On: 12/10/2016 17:38  ASSESSMENT & PLAN:  Malignant neoplasm of lower third of esophagus (HCC) # Metastatic esophageal cancer- local recurrence in the esophagus/ metastases to the bone -bilateral scapula/left ribs- which is painful [C discussion below]  # Discussed with the patient's son/wife that in general metastatic esophagus cancer median survival is approximately 8-12 months. I do not think patient's life expectancy would recently median given his multiple medical problems/performance status 3 at this time. Recommend palliative care evaluation.  # Left scapular pain status post radiation; we'll check with radiation regarding any further treatment options. Recommend oxycodone liquid when necessary.   # Diarrhea- question etiology- check C. difficile/stool cultures. Discussed with dietitian, Zachary George; if not improved then changed to alternative tube feeds.    #  Anemia needing transfusion- hemoglobin 7.5 status post transfusion today hemoglobin 8.8. Stool occult positive. Patient on anticoagulation [C discussion below]. We'll check with radiation oncology regarding palliative radiation to the esophagus. Awaiting iron studies if- LOW-  would recommend IV iron.  #  I again reviewed the systemic therapy would include- 5-FU infusion [I do not think patient can tolerate oxaliplatin] versus Taxol on progression. Not a candidate for cyramza [hx of stroke; ? Bleeding esophgeal ulcer]; NEG- her 2 NEU; NEG- PDL-1. Given the poor performance status/multiple comorbidities- patient is likely to have poor tolerance to chemotherapy.   #  PE- Oletta Darter 2017]- currently on Eliquis; continue the same. See discussion above  # Also discussed  regarding second opinion at [facility]. They will let us know if interested. Patient will follow-up in approximately 2-3 weeks/labs/hold tube. We will make a palliative care referral.   Orders Placed This Encounter  Procedures  . Clostridium Difficile by PCR    Standing Status:   Future    Standing Expiration Date:   01/06/2018    Order Specific Question:   Is your patient experiencing loose or watery stools (3 or more in 24 hours)?    Answer:   Yes    Order Specific Question:   Has the patient received laxatives in the last 24 hours?    Answer:   No    Order Specific Question:   Has a negative Cdiff test resulted in the last 7 days?    Answer:   No  . Gastrointestinal Panel by PCR , Stool    Standing Status:   Future    Standing Expiration Date:   01/06/2018   All questions were answered. The patient knows to call the clinic with any problems, questions or concerns.      Cammie Sickle, MD 01/06/2017 5:41 PM

## 2017-01-06 NOTE — Progress Notes (Signed)
6 + loose stools per day. patient has not used any imodium. Pt Still reports regurgitation with tube feeding; however pt did not take reglan as directed in "fear of side effects." pt reports increased in clear sputum and saliva production. Pt states when he lie flat he needs to get choked easily with his saliva.

## 2017-01-06 NOTE — Assessment & Plan Note (Addendum)
#  Metastatic esophageal cancer- local recurrence in the esophagus/ metastases to the bone -bilateral scapula/left ribs- which is painful [C discussion below]  # Discussed with the patient's son/wife that in general metastatic esophagus cancer median survival is approximately 8-12 months. I do not think patient's life expectancy would recently median given his multiple medical problems/performance status 3 at this time. Recommend palliative care evaluation.  # Left scapular pain status post radiation; we'll check with radiation regarding any further treatment options. Recommend oxycodone liquid when necessary.   # Diarrhea- question etiology- check C. difficile/stool cultures. Discussed with dietitian, Zachary George; if not improved then changed to alternative tube feeds.    #  Anemia needing transfusion- hemoglobin 7.5 status post transfusion today hemoglobin 8.8. Stool occult positive. Patient on anticoagulation [C discussion below]. We'll check with radiation oncology regarding palliative radiation to the esophagus. Awaiting iron studies if- LOW-  would recommend IV iron.  #  I again reviewed the systemic therapy would include- 5-FU infusion [I do not think patient can tolerate oxaliplatin] versus Taxol on progression. Not a candidate for cyramza [hx of stroke; ? Bleeding esophgeal ulcer]; NEG- her 2 NEU; NEG- PDL-1. Given the poor performance status/multiple comorbidities- patient is likely to have poor tolerance to chemotherapy.   #  PE- Oletta Darter 2017]- currently on Eliquis; continue the same. See discussion above  # Also discussed regarding second opinion at [facility]. They will let us know if interested. Patient will follow-up in approximately 2-3 weeks/labs/hold tube. We will make a palliative care referral.

## 2017-01-06 NOTE — Telephone Encounter (Signed)
Patient will follow-up with me  approximately 2-3 weeks/labs/hold tube. Dr.B

## 2017-01-06 NOTE — Telephone Encounter (Signed)
-----   Message from Manus Rudd, RN sent at 01/06/2017  9:34 AM EDT ----- Regarding: Patient's wife Patient's wife called and asked if she could speak privately with the doctor today regarding her husbands illness.

## 2017-01-06 NOTE — Progress Notes (Signed)
Nutrition Follow-up:  Patient with esophageal cancer s/p chemotherapy and radiation therapy April 2018 with recurrence. PEG tube placed on 7/12 during hospital admission.    Patient reports that he is having loose stools after most tube feeding (6 + loose stools per day).  Reports that it has been going on since getting tube placed but noted that on 7/19 on follow-up after hospital admission patient did not report diarrhea.  Patient reports that he has only had one episode of regurgitation of tube feeding that home health nurse saw.  Reports that he regurgitates saliva but not tube feeding.  Does not report that he feels full after giving tube feeding and water.    Patient reports that he did not try reglan or imodium.    Medications: reviewed  Labs: BUN 70, creatinine 1.51, glucose 166  Anthropometrics:   Weight is noted at 172 lb today, stable from 171 lb 9.6 oz on 7/19   Estimated Energy Needs  Kcals: 2060-2220 calories/d Protein: 98-122 g/d Fluid: 2 L/d  NUTRITION DIAGNOSIS: Inadequate oral intake continues as patient utilizing PEG   MALNUTRITION DIAGNOSIS: Moderate malnutrition improving   INTERVENTION:   Discussed continuous tube feeding option with combination of bolus tube feeding to help with regurgitation or switch completely to continuous option.  Encouraged patient to infuse tube feeding slowly with bolus syringe to help with diarrhea.   Also discussed changing tube feeding formula as an options.   Patient and family wants to try the liquid imodium first.  Dr. B planning on checking stool for infection today as well.      MONITORING, EVALUATION, GOAL: Patient will utilize PEG to maintain adequate nutrition and hydration   NEXT VISIT: phone call August 9  Evelean Bigler B. Zenia Resides, [place], [place] Registered Dietitian [phone] (pager)

## 2017-01-06 NOTE — Telephone Encounter (Signed)
msg given to Dr. Brahmanday.  

## 2017-01-08 ENCOUNTER — Ambulatory Visit: Payer: Medicare Other | Admitting: Internal Medicine

## 2017-01-08 ENCOUNTER — Other Ambulatory Visit: Payer: Medicare Other

## 2017-01-09 ENCOUNTER — Telehealth: Payer: Self-pay

## 2017-01-09 ENCOUNTER — Other Ambulatory Visit: Payer: Self-pay

## 2017-01-09 DIAGNOSIS — C155 Malignant neoplasm of lower third of esophagus: Secondary | ICD-10-CM

## 2017-01-09 LAB — GASTROINTESTINAL PANEL BY PCR, STOOL (REPLACES STOOL CULTURE)
ADENOVIRUS F40/41: NOT DETECTED
Astrovirus: NOT DETECTED
CRYPTOSPORIDIUM: NOT DETECTED
CYCLOSPORA CAYETANENSIS: NOT DETECTED
Campylobacter species: NOT DETECTED
ENTEROAGGREGATIVE E COLI (EAEC): NOT DETECTED
ENTEROPATHOGENIC E COLI (EPEC): NOT DETECTED
Entamoeba histolytica: NOT DETECTED
Enterotoxigenic E coli (ETEC): NOT DETECTED
GIARDIA LAMBLIA: NOT DETECTED
Norovirus GI/GII: NOT DETECTED
Plesimonas shigelloides: NOT DETECTED
Rotavirus A: NOT DETECTED
Salmonella species: NOT DETECTED
Sapovirus (I, II, IV, and V): NOT DETECTED
Shiga like toxin producing E coli (STEC): NOT DETECTED
Shigella/Enteroinvasive E coli (EIEC): NOT DETECTED
VIBRIO SPECIES: NOT DETECTED
Vibrio cholerae: NOT DETECTED
YERSINIA ENTEROCOLITICA: NOT DETECTED

## 2017-01-09 LAB — C DIFFICILE QUICK SCREEN W PCR REFLEX
C Diff antigen: NEGATIVE
C Diff interpretation: NOT DETECTED
C Diff toxin: NEGATIVE

## 2017-01-09 NOTE — Telephone Encounter (Signed)
  Oncology Nurse Navigator Documentation Received call from Ms. Connell regarding an appointment for "stiches removal" today at 1600. Verified with her that he has an appointment with Dr. Vira Agar at [facility] today at 1600. Navigator Location: [facility] (01/09/17 1100)   )Navigator Encounter Type: Telephone (01/09/17 1100) Telephone: Incoming Call;Appt Confirmation/Clarification (01/09/17 1100)                                                  Time Spent with Patient: 15 (01/09/17 1100)

## 2017-01-09 NOTE — Telephone Encounter (Signed)
Nutrition Follow-up:  Spoke with wife via phone this pm regarding diarrhea and tube feeding.  Wife reports that patient is taking imodium as prescribed on the label.  Thinks it may have help a little although had a "major blow out" this am about 1 hour after giving tube feeding.  Has not provided stool sample yet but planning on dropping it off today in the lab for testing.  Wife reports that they have to rely on family to bring them to the clinic and have not been able to drop sample off before today.  Has an appointment with Dr. Vira Agar to remove stiches this afternoon.   Wife reports that it takes about 20 minutes to infuse 1 1/2 cans of tube feeding and they are trying to do it slow.  No more episodes of regurgitation of tube feeding but does frequently spit up saliva due to esophageal mass.     Medications: reviewed  Labs: reviewed  Anthropometrics:   No new weight   Estimated Energy Needs  Kcals: 2060-2220 calories/d Protein: 98-122 g/d Fluid: 2 L/d  NUTRITION DIAGNOSIS: Inadequate oral intake continues as patient utilizing PEG   MALNUTRITION DIAGNOSIS: Moderate malnutrition improving   INTERVENTION:   Recommend trying trial of jevity 1.5, 3 cans (higher in fiber help thicken stool) and 3 cans of osmolite 1.5 (total 6 cans per day) with promod daily. Sample case of jevity 1.5 will be given to patient this pm to try. Reviewed water flush with wife and encouraged 52ml of water before and after feeding QID.  Will need additional 554ml water with medications and to keep patient hydrated.  Discussed metamucil with pharmacist (psyllium for bulk) but unable to be placed via tube.  Banatrol plus or nutrasource fiber maybe an option if jevity does not work.   Await results of stool tests.    MONITORING, EVALUATION, GOAL: patient will utilize PEG to maintain adequate nutrition and hydration   NEXT VISIT: phone call in 1 week  Yaslene Lindamood B. Zenia Resides, [place], [place] Registered  Dietitian [phone] (pager)

## 2017-01-10 ENCOUNTER — Telehealth: Payer: Self-pay | Admitting: *Deleted

## 2017-01-10 NOTE — Telephone Encounter (Signed)
Marcus [name] with Palliative Care went to see patient today and now requesting a Hospice referral be sent in

## 2017-01-13 NOTE — Telephone Encounter (Signed)
Hospice orders faxed on Friday - 01/10/17

## 2017-01-16 ENCOUNTER — Other Ambulatory Visit: Payer: Self-pay | Admitting: *Deleted

## 2017-01-16 ENCOUNTER — Telehealth: Payer: Self-pay

## 2017-01-16 DIAGNOSIS — C155 Malignant neoplasm of lower third of esophagus: Secondary | ICD-10-CM

## 2017-01-16 MED ORDER — JEVITY 1.5 CAL PO LIQD: ORAL | 0 refills | Status: AC

## 2017-01-16 MED ORDER — OSMOLITE 1.5 CAL PO LIQD: ORAL | 0 refills | Status: DC

## 2017-01-16 NOTE — Telephone Encounter (Signed)
Nutrition Follow-up:  Noted patient is now followed by hospice as of 01/10/17.    Spoke with wife via phone this am.  Wife reports that patient is tolerating osmolite 1.5, 3 cans per day and jevity 1.5, 3 cans per day.  Wife reports patient did not have any adverse effects from jevity 1.5 ("can't tell the difference") Wife reports no BM yesterday and she thinks on Tuesday has 2 loose stools.  Thinks also had loose/liquid stools on Monday but not sure how many.  Wife reports she is still giving imodium as directed on the bottle.  Wife reports he is still weak.  "I thought the jevity would give him more energy. " Wife continues with promod.  No other reports of intolerance.    Medications: reviewed  Labs: stool sample negative   Estimated Energy Needs  Kcals: 2060-2220 calories Protein: 98-122 g/d Fluid: 2 L/d  NUTRITION DIAGNOSIS: Inadequate oral intake continues as patient utilizing PEG   MALNUTRITION DIAGNOSIS: Moderate malnutrition continues   INTERVENTION:   Recommend to continue with osmolite 1.5, 3 cans and Jevity 1.5, 3 cans per day (higher in fiber to help thicken stool) and promod 62ml daily.  Current tube feeding regimen provides 2233 calories, 100 g of protein and 1032ml free water (formula alone).  Wife to give 1 1/2 cans of formulas QID with 31ml of water before and after feeding.  Wife will need to give additional 532ml of water with medications and flush via tube to keep hydrated.  Total water 2153ml/d. Reviewed with wife difference between formulas and how much nutrition it is providing.   Spoke with [employer], Hospice RN regarding new order for tube feeding.  Heather, Dr. Sharmaine Base RN will fax new prescriptions to hospice today.   Banatrol plus or nutrasource fiber maybe an option if diarrhea continues.  Contact information given to wife and hospice RN.       MONITORING, EVALUATION, GOAL: Patient will utilize PEG to maintain adequate nutrition and hydration.    NEXT  VISIT: as needed  Chloey Ricard B. Zenia Resides, [place], [place] Registered Dietitian [phone] (pager)

## 2017-01-21 ENCOUNTER — Other Ambulatory Visit: Payer: Self-pay | Admitting: Internal Medicine

## 2017-01-21 DIAGNOSIS — C155 Malignant neoplasm of lower third of esophagus: Secondary | ICD-10-CM

## 2017-01-22 ENCOUNTER — Inpatient Hospital Stay (HOSPITAL_BASED_OUTPATIENT_CLINIC_OR_DEPARTMENT_OTHER): Payer: Medicare Other | Admitting: Internal Medicine

## 2017-01-22 ENCOUNTER — Other Ambulatory Visit: Payer: Self-pay | Admitting: *Deleted

## 2017-01-22 ENCOUNTER — Other Ambulatory Visit: Payer: Self-pay

## 2017-01-22 ENCOUNTER — Telehealth: Payer: Self-pay | Admitting: *Deleted

## 2017-01-22 ENCOUNTER — Inpatient Hospital Stay: Payer: Medicare Other

## 2017-01-22 VITALS — BP 103/57 | HR 85 | Temp 97.8°F | Resp 20 | Ht 74.0 in | Wt 174.8 lb

## 2017-01-22 DIAGNOSIS — C155 Malignant neoplasm of lower third of esophagus: Secondary | ICD-10-CM

## 2017-01-22 DIAGNOSIS — D649 Anemia, unspecified: Secondary | ICD-10-CM | POA: Diagnosis not present

## 2017-01-22 DIAGNOSIS — C7951 Secondary malignant neoplasm of bone: Secondary | ICD-10-CM | POA: Diagnosis not present

## 2017-01-22 DIAGNOSIS — M1A9XX0 Chronic gout, unspecified, without tophus (tophi): Secondary | ICD-10-CM

## 2017-01-22 DIAGNOSIS — Z7901 Long term (current) use of anticoagulants: Secondary | ICD-10-CM

## 2017-01-22 DIAGNOSIS — Z923 Personal history of irradiation: Secondary | ICD-10-CM

## 2017-01-22 DIAGNOSIS — R197 Diarrhea, unspecified: Secondary | ICD-10-CM

## 2017-01-22 DIAGNOSIS — Z79899 Other long term (current) drug therapy: Secondary | ICD-10-CM | POA: Diagnosis not present

## 2017-01-22 DIAGNOSIS — Z86711 Personal history of pulmonary embolism: Secondary | ICD-10-CM

## 2017-01-22 LAB — SAMPLE TO BLOOD BANK

## 2017-01-22 LAB — CBC WITH DIFFERENTIAL/PLATELET
BASOS ABS: 0 10*3/uL (ref 0–0.1)
BASOS PCT: 0 %
EOS ABS: 0.1 10*3/uL (ref 0–0.7)
Eosinophils Relative: 2 %
HCT: 26.3 % — ABNORMAL LOW (ref 40.0–52.0)
HEMOGLOBIN: 8.2 g/dL — AB (ref 13.0–18.0)
Lymphocytes Relative: 8 %
Lymphs Abs: 0.6 10*3/uL — ABNORMAL LOW (ref 1.0–3.6)
MCH: 28.9 pg (ref 26.0–34.0)
MCHC: 31.2 g/dL — AB (ref 32.0–36.0)
MCV: 92.5 fL (ref 80.0–100.0)
MONOS PCT: 6 %
Monocytes Absolute: 0.5 10*3/uL (ref 0.2–1.0)
NEUTROS PCT: 84 %
Neutro Abs: 6.2 10*3/uL (ref 1.4–6.5)
Platelets: 301 10*3/uL (ref 150–440)
RBC: 2.84 MIL/uL — AB (ref 4.40–5.90)
RDW: 18.2 % — ABNORMAL HIGH (ref 11.5–14.5)
WBC: 7.4 10*3/uL (ref 3.8–10.6)

## 2017-01-22 MED ORDER — DIPHENOXYLATE-ATROPINE 2.5-0.025 MG/5ML PO LIQD: 5.0000 mL | Freq: Four times a day (QID) | ORAL | 1 refills | Status: DC | PRN

## 2017-01-22 MED ORDER — DIPHENOXYLATE-ATROPINE 2.5-0.025 MG PO TABS: 1.0000 | ORAL_TABLET | Freq: Four times a day (QID) | ORAL | 0 refills | Status: AC | PRN

## 2017-01-22 MED ORDER — AMOXICILLIN 250 MG/5ML PO SUSR: 500.0000 mg | Freq: Three times a day (TID) | ORAL | 0 refills | Status: AC

## 2017-01-22 NOTE — Assessment & Plan Note (Addendum)
#   Metastatic esophageal cancer- local recurrence in the esophagus/ metastases to the bone -bilateral scapula/left ribs- which is painful [C discussion below]  # Progressive metastatic disease/poor performance status- not a candidate for any further standard chemotherapy/immunotherapy [PDL 1 negative]. Recommend continued hospice. Patient not wanting to go to [facility] for a second opinion. I think it's reasonable given his overall poor performance status.  # Left scapular pain status post radiation; discussed with Dr. Donella Stade- his left chest wall rib metastases is also radiated. No role for any further radiation. Recommend oxycodone liquid when necessary.   # Diarrhea- question etiology-negative for C. difficile/stool culture. Discussed with dietitian, Zachary George; if not improved then changed to alternative tube feeds.  Recommend Lomotil; new prescription given.  #  Anemia needing transfusion-hemoglobin today 8.2. Likely secondary to chronic GI bleed/esophagus malignancy. Recommend close monitoring. Hold off transfusion today.  # Hyperlipidemia- recommend stopping Lipitor in the context of his overall poor prognosis/illness.  # Diarrhea- reviewed the recent workup negative for any infectious causes.; Question related to tube feeds.  Recommend follow-up with [facility].  # cellulitis/context of chronic gout.  Recommend amoxicillin liquid. New prescription given.  #  PE- Oletta Darter 2017]- currently on Eliquis; continue the same. See discussion above  # Hallucinations- multifactorial. Monitor for now.  # follow up in 3 weeks/ hold tube /possible need of PRBC.

## 2017-01-22 NOTE — Telephone Encounter (Signed)
Needs Lomotil in pill form because co pay is $140 for the liquid. She said she can crush a pill easier than to pay $140. Please resubmit prescription in pill form

## 2017-01-22 NOTE — Telephone Encounter (Signed)
Approved-ok to crush oral tab-lomotil

## 2017-01-22 NOTE — Telephone Encounter (Signed)
Violet informed of tab prescription being faxed

## 2017-01-22 NOTE — Progress Notes (Signed)
[facility] OFFICE PROGRESS NOTE  Patient Care Team: Chesley Noon, MD as PCP - General (Family Medicine) Manya Silvas, MD as Consulting Physician (Gastroenterology)  Cancer Staging No matching staging information was found for the patient.   Oncology History   # June 2017- Localized [? Stage II; no EUS]Distal Esophagus Adeno CA [Bx Dr.Elliot]; PET- no distant Mets; July 5th 2017-concurrent Carbo-Taxol- RT Vespasian.Sprague ]; OCT 18th PET- improved; DEC 2017- EGD- CR. April 2018-PET Recurrence- Bx- NEG [Dr.Elliot]  # LLL nodule- monitor for now [PET June 2017]  # CKD [creat 1.4-1.7]; Hx of stroke [facility] 2016]  June 2017-MOLECULAR STUDIES- Her 2- IHC-NEG; MMR-STABLE; 09/2016- PDL-NEGATIVE.      Malignant neoplasm of lower third of esophagus (HCC)      INTERVAL HISTORY:  Marcus [name] 83 y.o.  male pleasant patient with recently diagnosed metastatic esophageal cancer is here for follow-up. He is accompanied by his daughter/wife.   Since the last visit patient had been admitted to hospice.   He unfortunately has multiple complaints- continues to complain of left chest wall pain; left shoulder pain is improved. He is taking Tylenol on an as needed. He is not taking narcotic pain medications.  He continues to complain of diarrhea 3-4 loose stools/day; especially after his tube feeding. Otherwise tolerating tube placed fairly well.  Patient complains of swelling and pain of his right ring finger- had prior history of gout.  Patient's wife complained of- intermittent hallucinations-visual/auditory. No falls.  Continues to feel weak. Overall continues to feel poorly. Resting most of the time. Patient denies any blood in stools or black colored stools. Denies any unusual shortness of breath or chest pain.  REVIEW OF SYSTEMS:  A complete 10 point review of system is done which is negative except mentioned above/history of present illness.   PAST MEDICAL HISTORY :   Past Medical History:  Diagnosis Date  . Aphakia of right eye   . Arthritis   . Cancer ([place])    SKIN  . Enlarged prostate without lower urinary tract symptoms (luts)   . Esophageal cancer ([place])   . GERD (gastroesophageal reflux disease)   . Glaucoma   . Gout   . History of CVA (cerebrovascular accident)    Cerebral infarction Nov. 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 History of Doppler ultrasound    dopplers revealed mild left ICA stenosis which has remained stable, occluded left subclavian artery with retrograde left vertebral filling and moderately severe right subclavian artery stenosis. he has no symptoms of upper extermity claudication or subclavian steal symptoms.   . History of DVT (deep vein thrombosis)    right arm  . History of stress test 09/02/2009   abnormal myocardial perfusion scan demonstrating an attenuation defect in the inferior region of the myocardium. No ischemia or infarct/scar is seen in the remaining myocardium. No prior study available for comparison. Abnormal myocardial study EF 79%  . Hyperlipemia   . Hypertension   . Macular degeneration   . Pulmonary embolus ([place])   . Stroke ([place])   . Subclavian artery stenosis (HCC)     PAST SURGICAL HISTORY :   Past Surgical History:  Procedure Laterality Date  . CATARACT EXTRACTION    . COLONOSCOPY  2002  . ESOPHAGOGASTRODUODENOSCOPY (EGD) WITH PROPOFOL N/A 11/10/2015   Procedure: ESOPHAGOGASTRODUODENOSCOPY (EGD) WITH PROPOFOL;  Surgeon: Manya Silvas, MD;  Location: [facility] ENDOSCOPY;  Service: Endoscopy;  Laterality: N/A;  . ESOPHAGOGASTRODUODENOSCOPY (EGD) WITH  PROPOFOL N/A 04/29/2016   Procedure: ESOPHAGOGASTRODUODENOSCOPY (EGD) WITH PROPOFOL;  Surgeon: Manya Silvas, MD;  Location: [facility] ENDOSCOPY;  Service: Endoscopy;  Laterality: N/A;  . ESOPHAGOGASTRODUODENOSCOPY (EGD) WITH PROPOFOL N/A 10/07/2016   Procedure: ESOPHAGOGASTRODUODENOSCOPY (EGD) WITH PROPOFOL;  Surgeon: Manya Silvas, MD;   Location: [facility] ENDOSCOPY;  Service: Endoscopy;  Laterality: N/A;  . ESOPHAGOGASTRODUODENOSCOPY (EGD) WITH PROPOFOL N/A 12/11/2016   Procedure: ESOPHAGOGASTRODUODENOSCOPY (EGD) WITH PROPOFOL;  Surgeon: Leonie Green, MD;  Location: [facility] ENDOSCOPY;  Service: Endoscopy;  Laterality: N/A;  . FRACTURE SURGERY    . MOHS SURGERY    . PEG PLACEMENT N/A 12/11/2016   Procedure: PERCUTANEOUS ENDOSCOPIC GASTROSTOMY (PEG) PLACEMENT;  Surgeon: Leonie Green, MD;  Location: [facility] ENDOSCOPY;  Service: Endoscopy;  Laterality: N/A;  . PERIPHERAL VASCULAR CATHETERIZATION N/A 11/29/2015   Procedure: Glori Luis Cath Insertion;  Surgeon: Algernon Huxley, MD;  Location: [place] CV LAB;  Service: Cardiovascular;  Laterality: N/A;    FAMILY HISTORY :   Family History  Problem Relation Age of Onset  . Heart disease Brother   . Cancer - Colon Sister   . Cancer Sister   . Other Brother        CABG    SOCIAL HISTORY:   Social History  Substance Use Topics  . Smoking status: Former Smoker    Packs/day: 1.00    Years: 40.00    Types: Cigarettes    Quit date: 06/03/1993  . Smokeless tobacco: Never Used  . Alcohol use No    ALLERGIES:  is allergic to codeine; allopurinol; simvastatin; and uloric [febuxostat].  MEDICATIONS:  Current Outpatient Prescriptions  Medication Sig Dispense Refill  . acetaminophen (TYLENOL) 500 MG tablet Take 500 mg by mouth every 6 (six) hours as needed.    Marland Kitchen amLODipine (NORVASC) 2.5 MG tablet Take 2.5 mg by mouth daily.  0  . apixaban (ELIQUIS) 5 MG TABS tablet Take 1 tablet (5 mg total) by mouth 2 (two) times daily. Start on 03/03/2016 60 tablet 0  . atorvastatin (LIPITOR) 20 MG tablet Take 1 tablet (20 mg total) by mouth daily at 6 PM. (Patient taking differently: Take 20 mg by mouth daily with breakfast. ) 30 tablet 1  . calcium carbonate (TUMS EX) 750 MG chewable tablet Chew 1 tablet by mouth daily.    . dorzolamide (TRUSOPT) 2 % ophthalmic solution Place 1 drop into both  eyes 2 (two) times daily.    . finasteride (PROSCAR) 5 MG tablet Take 5 mg by mouth daily.  10  . latanoprost (XALATAN) 0.005 % ophthalmic solution Place 1 drop into both eyes at bedtime.    . metoprolol succinate (TOPROL-XL) 25 MG 24 hr tablet TAKE ONE TABLET (25 MG TOTAL) BY MOUTH DAILY.    Marland Kitchen Nutritional Supplements (FEEDING SUPPLEMENT, JEVITY 1.5 CAL,) LIQD Give 3 cans per day 711 mL 0  . Nutritional Supplements (FEEDING SUPPLEMENT, OSMOLITE 1.5 CAL,) LIQD Place 360 mLs into feeding tube 4 (four) times daily. 180 Bottle 0  . terazosin (HYTRIN) 10 MG capsule Take 10 mg by mouth at bedtime.  0  . timolol (TIMOPTIC) 0.5 % ophthalmic solution Place 1 drop into both eyes 2 (two) times daily.  3  . vitamin B-12 (CYANOCOBALAMIN) 500 MCG tablet Take 500 mcg by mouth daily.    . Vitamin D, Ergocalciferol, (DRISDOL) 50000 UNITS CAPS Take 50,000 Units by mouth every 7 (seven) days.    . Water For Irrigation, Sterile (FREE WATER) SOLN Place 120 mLs into feeding tube  4 (four) times daily. 15000 mL 0  . amoxicillin (AMOXIL) 250 MG/5ML suspension Place 10 mLs (500 mg total) into feeding tube 3 (three) times daily. 300 mL 0  . diphenoxylate-atropine (LOMOTIL) 2.5-0.025 MG/5ML liquid Place 5 mLs into feeding tube 4 (four) times daily as needed for diarrhea or loose stools. 120 mL 1  . oxyCODONE (ROXICODONE) 5 MG/5ML solution 5 ml Every 6-8 hours as needed for pain thu PEG tube. (Patient not taking: Reported on 01/22/2017) 120 mL 0   No current facility-administered medications for this visit.     PHYSICAL EXAMINATION: ECOG PERFORMANCE STATUS: 3 - Symptomatic, >50% confined to bed  BP (!) 103/57 (BP Location: Right Arm, Patient Position: Sitting)   Pulse 85   Temp 97.8 F (36.6 C) (Tympanic)   Resp 20   Ht 6' 2"  (1.88 m)   Wt 174 lb 12.8 oz (79.3 kg)   BMI 22.44 kg/m   Filed Weights   01/22/17 1137  Weight: 174 lb 12.8 oz (79.3 kg)    GENERAL: Well-nourished well-developed; Alert, no distress and  comfortable.  Accompanied by his wife/ son. He is in a wheelchair. EYES: no pallor or icterus OROPHARYNX: no thrush or ulceration;   NECK: supple, no masses felt LYMPH:  no palpable lymphadenopathy in the cervical  or inguinal regions. LUNGS: clear to auscultation and  No wheeze or crackles.  HEART/CVS: regular rate & rhythm and no murmurs; 1 plus bil lower extremity edema ABDOMEN:abdomen soft, non-tender and normal bowel sounds Musculoskeletal:no cyanosis of digits and no clubbing; gouty changes noted. Right ring finger swollen erythematous. No discharge noted. PSYCH: alert & oriented x 3 with fluent speech NEURO: no focal motor/sensory deficits SKIN:  no rashes;  LABORATORY DATA:  I have reviewed the data as listed    Component Value Date/Time   NA 135 01/06/2017 1359   K 4.4 01/06/2017 1359   CL 104 01/06/2017 1359   CO2 20 (L) 01/06/2017 1359   GLUCOSE 166 (H) 01/06/2017 1359   BUN 70 (H) 01/06/2017 1359   CREATININE 1.51 (H) 01/06/2017 1359   CALCIUM 8.5 (L) 01/06/2017 1359   PROT 7.0 12/10/2016 1659   ALBUMIN 2.9 (L) 12/10/2016 1659   AST 15 12/10/2016 1659   ALT 9 (L) 12/10/2016 1659   ALKPHOS 120 12/10/2016 1659   BILITOT 0.5 12/10/2016 1659   GFRNONAA 41 (L) 01/06/2017 1359   GFRAA 47 (L) 01/06/2017 1359    No results found for: SPEP, UPEP  Lab Results  Component Value Date   WBC 7.4 01/22/2017   NEUTROABS 6.2 01/22/2017   HGB 8.2 (L) 01/22/2017   HCT 26.3 (L) 01/22/2017   MCV 92.5 01/22/2017   PLT 301 01/22/2017      Chemistry      Component Value Date/Time   NA 135 01/06/2017 1359   K 4.4 01/06/2017 1359   CL 104 01/06/2017 1359   CO2 20 (L) 01/06/2017 1359   BUN 70 (H) 01/06/2017 1359   CREATININE 1.51 (H) 01/06/2017 1359      Component Value Date/Time   CALCIUM 8.5 (L) 01/06/2017 1359   ALKPHOS 120 12/10/2016 1659   AST 15 12/10/2016 1659   ALT 9 (L) 12/10/2016 1659   BILITOT 0.5 12/10/2016 1659       RADIOGRAPHIC STUDIES: I have  personally reviewed the radiological images as listed and agreed with the findings in the report. No results found.   IMPRESSION: 1. New lytic skeletal metastasis than LEFT lateral fourth rib, and  LEFT and RIGHT scapula. 2. Increased in metabolic activity at the GE junction lesion. There is new fluid retention within the esophagus. 3. Focal activity in the midline cerebellum is indeterminate. Recommend MRI brain with contrast to exclude brain metastasis. 4. Potential soft tissue metastasis to the LEFT gluteus muscle. Indeterminate. 5. Stable pulmonary nodules without metabolic activity.   Electronically Signed   By: Suzy Bouchard M.D.   On: 12/10/2016 17:38  ASSESSMENT & PLAN:  Malignant neoplasm of lower third of esophagus (HCC) # Metastatic esophageal cancer- local recurrence in the esophagus/ metastases to the bone -bilateral scapula/left ribs- which is painful [C discussion below]  # Progressive metastatic disease/poor performance status- not a candidate for any further standard chemotherapy/immunotherapy [PDL 1 negative]. Recommend continued hospice. Patient not wanting to go to [facility] for a second opinion. I think it's reasonable given his overall poor performance status.  # Left scapular pain status post radiation; discussed with Dr. Donella Stade- his left chest wall rib metastases is also radiated. No role for any further radiation. Recommend oxycodone liquid when necessary.   # Diarrhea- question etiology-negative for C. difficile/stool culture. Discussed with dietitian, Zachary George; if not improved then changed to alternative tube feeds.  Recommend Lomotil; new prescription given.  #  Anemia needing transfusion-hemoglobin today 8.2. Likely secondary to chronic GI bleed/esophagus malignancy. Recommend close monitoring. Hold off transfusion today.  # Hyperlipidemia- recommend stopping Lipitor in the context of his overall poor prognosis/illness.  # Diarrhea- reviewed the recent  workup negative for any infectious causes.; Question related to tube feeds.  Recommend follow-up with Almyra Free.  # cellulitis/context of chronic gout.  Recommend amoxicillin liquid. New prescription given.  #  PE- Oletta Darter 2017]- currently on Eliquis; continue the same. See discussion above  # Hallucinations- multifactorial. Monitor for now.  # follow up in 3 weeks/ hold tube /possible need of PRBC.    Orders Placed This Encounter  Procedures  . CBC with Differential/Platelet    Standing Status:   Future    Standing Expiration Date:   01/22/2018  . Basic metabolic panel    Standing Status:   Future    Standing Expiration Date:   01/22/2018  . Hold Tube- Blood Bank    Standing Status:   Future    Standing Expiration Date:   01/22/2018   All questions were answered. The patient knows to call the clinic with any problems, questions or concerns.      Cammie Sickle, MD 01/22/2017 1:28 PM

## 2017-01-27 ENCOUNTER — Telehealth: Payer: Self-pay

## 2017-01-27 NOTE — Telephone Encounter (Signed)
Nutrition Follow-up:  Spoke with wife via phone this pm.  Patient taking 3 cans of jevity 1.5 and 3 cans of osmolite 1.5 per day with promod 76ml daily.  Reports that she is using about 1 1/2, 20oz (953ml) of bottled water per day to flush tube with water and water with medications.   Wife reports this am patient had large liquid bowel movement but none the remainder of the day so far.  Wife reports yesterday no bowel movement. Wife reports on Saturday thinks he had 2-3 loose bowel movements but not watery.  Wife reports that she has been putting a little bit of pureed bread via tube to help with diarrhea.     Wife has not noticed increase bowel movement after giving promod or after starting antibiotic.    Medications: amoxil started on 8/22, lomotil added  Labs: reviewed   Estimated Energy Needs  Kcals: 2060-2220 calories/d Protein: 98-122 g/d Fluid: 2 L/d  NUTRITION DIAGNOSIS: Inadequate oral intake continues   MALNUTRITION DIAGNOSIS: Moderate malnutrition continues   INTERVENTION:   Recommend to continue with osmolite 1.5, 3 cans and Jevity 1.5, 3 cans per day (higher in soluble fiber to help thicken stool) and promod 58ml daily.  Current tube feeding regimen provides 2233 calories, 100 g of protein and 1085ml free water (formula alone) + 940ml additional free water.   Spoke with pharmacist regarding adding probiotic via tube and not recommended.   Cautioned wife about pureeing bread and placing in feeding tube.   No further changes in tube feeding regimen at this time as lomotil has been added, started antibiotic, stool frequency 3 or less with some days not any, some days loose stool other days liquid.      MONITORING, EVALUATION, GOAL: Patient will utilize PEG to maintain adequate nutrition and hydration   NEXT VISIT: as needed  Dellas Guard B. Zenia Resides, [place], [place] Registered Dietitian [phone] (pager)

## 2017-02-07 ENCOUNTER — Emergency Department

## 2017-02-07 ENCOUNTER — Inpatient Hospital Stay

## 2017-02-07 ENCOUNTER — Ambulatory Visit: Admission: RE | Admit: 2017-02-07 | Payer: Medicare Other | Source: Ambulatory Visit | Admitting: Radiation Oncology

## 2017-02-07 ENCOUNTER — Inpatient Hospital Stay: Attending: Internal Medicine

## 2017-02-07 ENCOUNTER — Inpatient Hospital Stay
Admission: EM | Admit: 2017-02-07 | Discharge: 2017-03-03 | DRG: 637 | Disposition: E | Attending: Internal Medicine | Admitting: Internal Medicine

## 2017-02-07 ENCOUNTER — Other Ambulatory Visit: Payer: Self-pay

## 2017-02-07 ENCOUNTER — Encounter: Payer: Self-pay | Admitting: Emergency Medicine

## 2017-02-07 ENCOUNTER — Ambulatory Visit: Payer: Medicare Other | Admitting: Oncology

## 2017-02-07 ENCOUNTER — Other Ambulatory Visit: Payer: Self-pay | Admitting: *Deleted

## 2017-02-07 ENCOUNTER — Telehealth: Payer: Self-pay | Admitting: *Deleted

## 2017-02-07 DIAGNOSIS — E870 Hyperosmolality and hypernatremia: Secondary | ICD-10-CM | POA: Diagnosis present

## 2017-02-07 DIAGNOSIS — I6522 Occlusion and stenosis of left carotid artery: Secondary | ICD-10-CM | POA: Diagnosis not present

## 2017-02-07 DIAGNOSIS — Z9221 Personal history of antineoplastic chemotherapy: Secondary | ICD-10-CM | POA: Insufficient documentation

## 2017-02-07 DIAGNOSIS — Z8673 Personal history of transient ischemic attack (TIA), and cerebral infarction without residual deficits: Secondary | ICD-10-CM

## 2017-02-07 DIAGNOSIS — C155 Malignant neoplasm of lower third of esophagus: Secondary | ICD-10-CM | POA: Insufficient documentation

## 2017-02-07 DIAGNOSIS — Z9889 Other specified postprocedural states: Secondary | ICD-10-CM

## 2017-02-07 DIAGNOSIS — D500 Iron deficiency anemia secondary to blood loss (chronic): Secondary | ICD-10-CM | POA: Diagnosis not present

## 2017-02-07 DIAGNOSIS — Z809 Family history of malignant neoplasm, unspecified: Secondary | ICD-10-CM

## 2017-02-07 DIAGNOSIS — I129 Hypertensive chronic kidney disease with stage 1 through stage 4 chronic kidney disease, or unspecified chronic kidney disease: Secondary | ICD-10-CM | POA: Diagnosis not present

## 2017-02-07 DIAGNOSIS — C159 Malignant neoplasm of esophagus, unspecified: Secondary | ICD-10-CM | POA: Diagnosis present

## 2017-02-07 DIAGNOSIS — Z79899 Other long term (current) drug therapy: Secondary | ICD-10-CM

## 2017-02-07 DIAGNOSIS — K219 Gastro-esophageal reflux disease without esophagitis: Secondary | ICD-10-CM | POA: Insufficient documentation

## 2017-02-07 DIAGNOSIS — Z885 Allergy status to narcotic agent status: Secondary | ICD-10-CM

## 2017-02-07 DIAGNOSIS — K922 Gastrointestinal hemorrhage, unspecified: Secondary | ICD-10-CM

## 2017-02-07 DIAGNOSIS — Z87891 Personal history of nicotine dependence: Secondary | ICD-10-CM | POA: Diagnosis not present

## 2017-02-07 DIAGNOSIS — N183 Chronic kidney disease, stage 3 (moderate): Secondary | ICD-10-CM | POA: Diagnosis present

## 2017-02-07 DIAGNOSIS — Z9849 Cataract extraction status, unspecified eye: Secondary | ICD-10-CM

## 2017-02-07 DIAGNOSIS — H409 Unspecified glaucoma: Secondary | ICD-10-CM

## 2017-02-07 DIAGNOSIS — H3530 Unspecified macular degeneration: Secondary | ICD-10-CM | POA: Diagnosis not present

## 2017-02-07 DIAGNOSIS — K5730 Diverticulosis of large intestine without perforation or abscess without bleeding: Secondary | ICD-10-CM

## 2017-02-07 DIAGNOSIS — E860 Dehydration: Secondary | ICD-10-CM | POA: Diagnosis not present

## 2017-02-07 DIAGNOSIS — Z86718 Personal history of other venous thrombosis and embolism: Secondary | ICD-10-CM

## 2017-02-07 DIAGNOSIS — E785 Hyperlipidemia, unspecified: Secondary | ICD-10-CM | POA: Insufficient documentation

## 2017-02-07 DIAGNOSIS — Z86711 Personal history of pulmonary embolism: Secondary | ICD-10-CM

## 2017-02-07 DIAGNOSIS — L8990 Pressure ulcer of unspecified site, unspecified stage: Secondary | ICD-10-CM | POA: Insufficient documentation

## 2017-02-07 DIAGNOSIS — R197 Diarrhea, unspecified: Secondary | ICD-10-CM

## 2017-02-07 DIAGNOSIS — E875 Hyperkalemia: Secondary | ICD-10-CM | POA: Diagnosis present

## 2017-02-07 DIAGNOSIS — Z888 Allergy status to other drugs, medicaments and biological substances status: Secondary | ICD-10-CM

## 2017-02-07 DIAGNOSIS — R0602 Shortness of breath: Secondary | ICD-10-CM

## 2017-02-07 DIAGNOSIS — Z66 Do not resuscitate: Secondary | ICD-10-CM

## 2017-02-07 DIAGNOSIS — N400 Enlarged prostate without lower urinary tract symptoms: Secondary | ICD-10-CM | POA: Diagnosis not present

## 2017-02-07 DIAGNOSIS — E1165 Type 2 diabetes mellitus with hyperglycemia: Secondary | ICD-10-CM | POA: Diagnosis present

## 2017-02-07 DIAGNOSIS — J9601 Acute respiratory failure with hypoxia: Secondary | ICD-10-CM | POA: Diagnosis present

## 2017-02-07 DIAGNOSIS — E8809 Other disorders of plasma-protein metabolism, not elsewhere classified: Secondary | ICD-10-CM | POA: Diagnosis present

## 2017-02-07 DIAGNOSIS — N179 Acute kidney failure, unspecified: Secondary | ICD-10-CM | POA: Diagnosis present

## 2017-02-07 DIAGNOSIS — K225 Diverticulum of esophagus, acquired: Secondary | ICD-10-CM

## 2017-02-07 DIAGNOSIS — N189 Chronic kidney disease, unspecified: Secondary | ICD-10-CM

## 2017-02-07 DIAGNOSIS — Z8249 Family history of ischemic heart disease and other diseases of the circulatory system: Secondary | ICD-10-CM

## 2017-02-07 DIAGNOSIS — Z923 Personal history of irradiation: Secondary | ICD-10-CM

## 2017-02-07 DIAGNOSIS — E119 Type 2 diabetes mellitus without complications: Secondary | ICD-10-CM | POA: Diagnosis not present

## 2017-02-07 DIAGNOSIS — M109 Gout, unspecified: Secondary | ICD-10-CM

## 2017-02-07 DIAGNOSIS — J690 Pneumonitis due to inhalation of food and vomit: Secondary | ICD-10-CM | POA: Diagnosis present

## 2017-02-07 DIAGNOSIS — I4891 Unspecified atrial fibrillation: Secondary | ICD-10-CM | POA: Diagnosis present

## 2017-02-07 DIAGNOSIS — R739 Hyperglycemia, unspecified: Secondary | ICD-10-CM | POA: Diagnosis present

## 2017-02-07 DIAGNOSIS — C7951 Secondary malignant neoplasm of bone: Secondary | ICD-10-CM | POA: Diagnosis not present

## 2017-02-07 DIAGNOSIS — R600 Localized edema: Secondary | ICD-10-CM | POA: Diagnosis present

## 2017-02-07 DIAGNOSIS — E1122 Type 2 diabetes mellitus with diabetic chronic kidney disease: Secondary | ICD-10-CM | POA: Diagnosis present

## 2017-02-07 DIAGNOSIS — Z931 Gastrostomy status: Secondary | ICD-10-CM

## 2017-02-07 DIAGNOSIS — Z7901 Long term (current) use of anticoagulants: Secondary | ICD-10-CM

## 2017-02-07 DIAGNOSIS — D509 Iron deficiency anemia, unspecified: Secondary | ICD-10-CM | POA: Diagnosis present

## 2017-02-07 DIAGNOSIS — L03116 Cellulitis of left lower limb: Secondary | ICD-10-CM

## 2017-02-07 LAB — URINALYSIS, COMPLETE (UACMP) WITH MICROSCOPIC
Bilirubin Urine: NEGATIVE
HGB URINE DIPSTICK: NEGATIVE
KETONES UR: NEGATIVE mg/dL
LEUKOCYTES UA: NEGATIVE
NITRITE: NEGATIVE
PH: 5 (ref 5.0–8.0)
PROTEIN: NEGATIVE mg/dL
Specific Gravity, Urine: 1.016 (ref 1.005–1.030)
Squamous Epithelial / LPF: NONE SEEN

## 2017-02-07 LAB — COMPREHENSIVE METABOLIC PANEL
ALT: 28 U/L (ref 17–63)
AST: 39 U/L (ref 15–41)
Albumin: 2.1 g/dL — ABNORMAL LOW (ref 3.5–5.0)
Alkaline Phosphatase: 382 U/L — ABNORMAL HIGH (ref 38–126)
Anion gap: 7 (ref 5–15)
BILIRUBIN TOTAL: 0.4 mg/dL (ref 0.3–1.2)
BUN: 91 mg/dL — AB (ref 6–20)
CHLORIDE: 120 mmol/L — AB (ref 101–111)
CO2: 22 mmol/L (ref 22–32)
CREATININE: 1.77 mg/dL — AB (ref 0.61–1.24)
Calcium: 8.1 mg/dL — ABNORMAL LOW (ref 8.9–10.3)
GFR, EST AFRICAN AMERICAN: 39 mL/min — AB (ref >60)
GFR, EST NON AFRICAN AMERICAN: 34 mL/min — AB (ref >60)
Glucose, Bld: 493 mg/dL — ABNORMAL HIGH (ref 65–99)
POTASSIUM: 5.7 mmol/L — AB (ref 3.5–5.1)
Sodium: 149 mmol/L — ABNORMAL HIGH (ref 135–145)
TOTAL PROTEIN: 6.7 g/dL (ref 6.5–8.1)

## 2017-02-07 LAB — CBC WITH DIFFERENTIAL/PLATELET
Basophils Absolute: 0 10*3/uL (ref 0–0.1)
Basophils Relative: 1 %
Eosinophils Absolute: 0.1 10*3/uL (ref 0–0.7)
Eosinophils Relative: 1 %
HCT: 23.6 % — ABNORMAL LOW (ref 40.0–52.0)
HEMOGLOBIN: 7.1 g/dL — AB (ref 13.0–18.0)
LYMPHS ABS: 0.6 10*3/uL — AB (ref 1.0–3.6)
LYMPHS PCT: 9 %
MCH: 28.5 pg (ref 26.0–34.0)
MCHC: 30.2 g/dL — ABNORMAL LOW (ref 32.0–36.0)
MCV: 94.3 fL (ref 80.0–100.0)
Monocytes Absolute: 0.4 10*3/uL (ref 0.2–1.0)
Monocytes Relative: 6 %
NEUTROS PCT: 83 %
Neutro Abs: 5.2 10*3/uL (ref 1.4–6.5)
Platelets: 265 10*3/uL (ref 150–440)
RBC: 2.5 MIL/uL — AB (ref 4.40–5.90)
RDW: 20 % — ABNORMAL HIGH (ref 11.5–14.5)
WBC: 6.3 10*3/uL (ref 3.8–10.6)

## 2017-02-07 LAB — POTASSIUM: Potassium: 4.7 mmol/L (ref 3.5–5.1)

## 2017-02-07 LAB — PREPARE RBC (CROSSMATCH)

## 2017-02-07 LAB — BASIC METABOLIC PANEL
Anion gap: 14 (ref 5–15)
BUN: 94 mg/dL — AB (ref 6–20)
CHLORIDE: 116 mmol/L — AB (ref 101–111)
CO2: 18 mmol/L — AB (ref 22–32)
Calcium: 8.5 mg/dL — ABNORMAL LOW (ref 8.9–10.3)
Creatinine, Ser: 1.87 mg/dL — ABNORMAL HIGH (ref 0.61–1.24)
GFR calc Af Amer: 37 mL/min — ABNORMAL LOW (ref >60)
GFR calc non Af Amer: 32 mL/min — ABNORMAL LOW (ref >60)
GLUCOSE: 556 mg/dL — AB (ref 65–99)
POTASSIUM: 5.3 mmol/L — AB (ref 3.5–5.1)
Sodium: 148 mmol/L — ABNORMAL HIGH (ref 135–145)

## 2017-02-07 LAB — GLUCOSE, CAPILLARY
GLUCOSE-CAPILLARY: 452 mg/dL — AB (ref 65–99)
GLUCOSE-CAPILLARY: 274 mg/dL — AB (ref 65–99)
Glucose-Capillary: 297 mg/dL — ABNORMAL HIGH (ref 65–99)
Glucose-Capillary: 285 mg/dL — ABNORMAL HIGH (ref 65–99)
Glucose-Capillary: 284 mg/dL — ABNORMAL HIGH (ref 65–99)

## 2017-02-07 LAB — MAGNESIUM: Magnesium: 2.4 mg/dL (ref 1.7–2.4)

## 2017-02-07 MED ORDER — VITAMIN D (ERGOCALCIFEROL) 1.25 MG (50000 UNIT) PO CAPS: 50000.0000 [IU] | ORAL_CAPSULE | ORAL | Status: DC

## 2017-02-07 MED ORDER — METOPROLOL SUCCINATE ER 25 MG PO TB24
12.5000 mg | ORAL_TABLET | Freq: Every day | ORAL | Status: DC
  Filled 2017-02-07: qty 1

## 2017-02-07 MED ORDER — SODIUM POLYSTYRENE SULFONATE 15 GM/60ML PO SUSP
30.0000 g | Freq: Once | ORAL | Status: AC
Start: 2017-02-07 — End: 2017-02-07
  Administered 2017-02-07: 30 g

## 2017-02-07 MED ORDER — SODIUM CHLORIDE 0.9 % IV SOLN
INTRAVENOUS | Status: DC
  Administered 2017-02-07 – 2017-02-08 (×2): via INTRAVENOUS

## 2017-02-07 MED ORDER — FUROSEMIDE 10 MG/ML IJ SOLN: 20.0000 mg | Freq: Once | INTRAMUSCULAR | Status: DC

## 2017-02-07 MED ORDER — CEPHALEXIN 500 MG PO CAPS: 500.0000 mg | ORAL_CAPSULE | Freq: Three times a day (TID) | ORAL | Status: DC

## 2017-02-07 MED ORDER — ACETAMINOPHEN 650 MG RE SUPP: 650.0000 mg | Freq: Four times a day (QID) | RECTAL | Status: DC | PRN

## 2017-02-07 MED ORDER — AMLODIPINE BESYLATE 5 MG PO TABS
2.5000 mg | ORAL_TABLET | Freq: Every day | ORAL | Status: DC
  Filled 2017-02-07: qty 1

## 2017-02-07 MED ORDER — FREE WATER
120.0000 mL | Freq: Four times a day (QID) | Status: DC
  Administered 2017-02-08: 09:00:00 120 mL
  Filled 2017-02-07 (×2): qty 120

## 2017-02-07 MED ORDER — FUROSEMIDE 10 MG/ML IJ SOLN
20.0000 mg | Freq: Once | INTRAMUSCULAR | Status: AC
  Administered 2017-02-07: 20 mg via INTRAVENOUS
  Filled 2017-02-07: qty 2

## 2017-02-07 MED ORDER — ACETAMINOPHEN 325 MG PO TABS: 650.0000 mg | ORAL_TABLET | Freq: Four times a day (QID) | ORAL | Status: DC | PRN

## 2017-02-07 MED ORDER — SODIUM POLYSTYRENE SULFONATE 15 GM/60ML PO SUSP
30.0000 g | Freq: Once | ORAL | Status: DC
  Filled 2017-02-07: qty 120

## 2017-02-07 MED ORDER — VITAMIN B-12 1000 MCG PO TABS
500.0000 ug | ORAL_TABLET | Freq: Every day | ORAL | Status: DC
  Filled 2017-02-07: qty 1

## 2017-02-07 MED ORDER — SENNOSIDES-DOCUSATE SODIUM 8.6-50 MG PO TABS: 1.0000 | ORAL_TABLET | Freq: Every evening | ORAL | Status: DC | PRN

## 2017-02-07 MED ORDER — INSULIN GLARGINE 100 UNIT/ML ~~LOC~~ SOLN
10.0000 [IU] | Freq: Every day | SUBCUTANEOUS | Status: DC
  Administered 2017-02-07 – 2017-02-08 (×2): 10 [IU] via SUBCUTANEOUS
  Filled 2017-02-07 (×3): qty 0.1

## 2017-02-07 MED ORDER — TIMOLOL MALEATE 0.5 % OP SOLN
1.0000 [drp] | Freq: Two times a day (BID) | OPHTHALMIC | Status: DC
  Administered 2017-02-07 – 2017-02-09 (×4): 1 [drp] via OPHTHALMIC
  Filled 2017-02-07: qty 5

## 2017-02-07 MED ORDER — SODIUM POLYSTYRENE SULFONATE 15 GM/60ML PO SUSP: 15.0000 g | Freq: Once | ORAL | Status: DC

## 2017-02-07 MED ORDER — VANCOMYCIN HCL IN DEXTROSE 1-5 GM/200ML-% IV SOLN: 1000.0000 mg | Freq: Once | INTRAVENOUS | Status: DC

## 2017-02-07 MED ORDER — DIPHENOXYLATE-ATROPINE 2.5-0.025 MG PO TABS: 1.0000 | ORAL_TABLET | Freq: Four times a day (QID) | ORAL | Status: DC | PRN

## 2017-02-07 MED ORDER — APIXABAN 5 MG PO TABS
5.0000 mg | ORAL_TABLET | Freq: Two times a day (BID) | ORAL | Status: DC
  Filled 2017-02-07 (×2): qty 1

## 2017-02-07 MED ORDER — ONDANSETRON HCL 4 MG PO TABS: 4.0000 mg | ORAL_TABLET | Freq: Four times a day (QID) | ORAL | Status: DC | PRN

## 2017-02-07 MED ORDER — LATANOPROST 0.005 % OP SOLN
1.0000 [drp] | Freq: Every day | OPHTHALMIC | Status: DC
  Administered 2017-02-07 – 2017-02-08 (×2): 1 [drp] via OPHTHALMIC
  Filled 2017-02-07: qty 2.5

## 2017-02-07 MED ORDER — ATORVASTATIN CALCIUM 20 MG PO TABS
20.0000 mg | ORAL_TABLET | Freq: Every day | ORAL | Status: DC
  Filled 2017-02-07: qty 1

## 2017-02-07 MED ORDER — SODIUM CHLORIDE 0.9 % IV SOLN
Freq: Once | INTRAVENOUS | Status: AC
  Administered 2017-02-07: 21:00:00 via INTRAVENOUS

## 2017-02-07 MED ORDER — SODIUM CHLORIDE 0.9 % IV BOLUS (SEPSIS)
1000.0000 mL | Freq: Once | INTRAVENOUS | Status: AC
  Administered 2017-02-07: 1000 mL via INTRAVENOUS

## 2017-02-07 MED ORDER — INSULIN ASPART 100 UNIT/ML ~~LOC~~ SOLN
8.0000 [IU] | Freq: Once | SUBCUTANEOUS | Status: AC
  Administered 2017-02-07: 8 [IU] via INTRAVENOUS
  Filled 2017-02-07: qty 1

## 2017-02-07 MED ORDER — DORZOLAMIDE HCL 2 % OP SOLN
1.0000 [drp] | Freq: Two times a day (BID) | OPHTHALMIC | Status: DC
  Administered 2017-02-07 – 2017-02-09 (×4): 1 [drp] via OPHTHALMIC
  Filled 2017-02-07: qty 10

## 2017-02-07 MED ORDER — TERAZOSIN HCL 5 MG PO CAPS
10.0000 mg | ORAL_CAPSULE | Freq: Every day | ORAL | Status: DC
  Filled 2017-02-07: qty 2

## 2017-02-07 MED ORDER — SODIUM CHLORIDE 0.9 % IV SOLN
Freq: Once | INTRAVENOUS | Status: AC
Start: 2017-02-07 — End: 2017-02-07
  Administered 2017-02-07: 16:00:00 via INTRAVENOUS

## 2017-02-07 MED ORDER — FINASTERIDE 5 MG PO TABS
5.0000 mg | ORAL_TABLET | Freq: Every day | ORAL | Status: DC
  Filled 2017-02-07: qty 1

## 2017-02-07 MED ORDER — INSULIN ASPART 100 UNIT/ML ~~LOC~~ SOLN
0.0000 [IU] | Freq: Three times a day (TID) | SUBCUTANEOUS | Status: DC
Start: 2017-02-08 — End: 2017-02-10
  Administered 2017-02-08 – 2017-02-09 (×4): 7 [IU] via SUBCUTANEOUS
  Filled 2017-02-07 (×4): qty 1

## 2017-02-07 MED ORDER — ONDANSETRON HCL 4 MG/2ML IJ SOLN
4.0000 mg | Freq: Four times a day (QID) | INTRAMUSCULAR | Status: DC | PRN
Start: 2017-02-07 — End: 2017-02-10
  Filled 2017-02-07: qty 2

## 2017-02-07 NOTE — Progress Notes (Signed)
Family Meeting Note  Advance Directive:no  Today a meeting took place with the Patient.spounse son  The following clinical team members were present during this meeting:MD  The following were discussed:Patient's diagnosis: Status for esophageal cancer Hyperglycemia Hyperkalemia Anemia, Patient's progosis: Unable to determine and Goals for treatment: DNR  Additional follow-up to be provided: Patient has hospice services at home Chaplain consultation  Time spent during discussion 18 minutes  Phillipa Morden, MD

## 2017-02-07 NOTE — Consult Note (Signed)
[facility] NOTE  Patient Care Team: Chesley Noon, MD as PCP - General (Family Medicine) Manya Silvas, MD as Consulting Physician (Gastroenterology)  CHIEF COMPLAINTS/PURPOSE OF CONSULTATION:  Metastatic adenocarcinoma of the esophagus.   HISTORY OF PRESENTING ILLNESS:  Marcus [name] 83 y.o.  male with a history of metastatic adenocarcinoma of the esophagus/recurrent disease of bones- status post radiation most recently approximately 1 month ago.Patient given the significant decline in performance status/multiple comorbidities- thought not to be candidate for any systemic therapy. Patient has been currently under hospice at home.  However, patient noted to have worsening diarrhea over the last few weeks. Watery multiple loose stools; no blood. Imodium/Lomotil not helping. Patient was at the radiation oncologist office this morning-and then directed to the emergency room given the severe diarrhea/and also elevated blood sugars of 550s. As per the family have noted significant decline in his mental status over the last 2-3 weeks.  In the emergency room patient had chest x-ray that showed no acute process; also had bilateral lower extremity Dopplers negative for DVT. Patient has been started on IV fluids; insulin; and also received Kayexalate for his potassium of 5.7. His also ordered 1 unit of blood transfusion for hemoglobin is 7.1.  Patient is currently resting comfortably. Denies any pain. His drowsy.  ROS: Difficult to assess given his mental status.   MEDICAL HISTORY:  Past Medical History:  Diagnosis Date  . Aphakia of right eye   . Arthritis   . Cancer ([place])    SKIN  . Enlarged prostate without lower urinary tract symptoms (luts)   . Esophageal cancer ([place])   . GERD (gastroesophageal reflux disease)   . Glaucoma   . Gout   . History of CVA (cerebrovascular accident)    Cerebral infarction Nov. 2016; thrombosis of cerebral artery July  2016   . History of Doppler ultrasound    dopplers revealed mild left ICA stenosis which has remained stable, occluded left subclavian artery with retrograde left vertebral filling and moderately severe right subclavian artery stenosis. he has no symptoms of upper extermity claudication or subclavian steal symptoms.   . History of DVT (deep vein thrombosis)    right arm  . History of stress test 09/02/2009   abnormal myocardial perfusion scan demonstrating an attenuation defect in the inferior region of the myocardium. No ischemia or infarct/scar is seen in the remaining myocardium. No prior study available for comparison. Abnormal myocardial study EF 79%  . Hyperlipemia   . Hypertension   . Macular degeneration   . Pulmonary embolus ([place])   . Stroke ([place])   . Subclavian artery stenosis ([facility])     SURGICAL HISTORY: Past Surgical History:  Procedure Laterality Date  . CATARACT EXTRACTION    . COLONOSCOPY  2002  . ESOPHAGOGASTRODUODENOSCOPY (EGD) WITH PROPOFOL N/A 11/10/2015   Procedure: ESOPHAGOGASTRODUODENOSCOPY (EGD) WITH PROPOFOL;  Surgeon: Manya Silvas, MD;  Location: [facility] ENDOSCOPY;  Service: Endoscopy;  Laterality: N/A;  . ESOPHAGOGASTRODUODENOSCOPY (EGD) WITH PROPOFOL N/A 04/29/2016   Procedure: ESOPHAGOGASTRODUODENOSCOPY (EGD) WITH PROPOFOL;  Surgeon: Manya Silvas, MD;  Location: [facility] ENDOSCOPY;  Service: Endoscopy;  Laterality: N/A;  . ESOPHAGOGASTRODUODENOSCOPY (EGD) WITH PROPOFOL N/A 10/07/2016   Procedure: ESOPHAGOGASTRODUODENOSCOPY (EGD) WITH PROPOFOL;  Surgeon: Manya Silvas, MD;  Location: [facility] ENDOSCOPY;  Service: Endoscopy;  Laterality: N/A;  . ESOPHAGOGASTRODUODENOSCOPY (EGD) WITH PROPOFOL N/A 12/11/2016   Procedure: ESOPHAGOGASTRODUODENOSCOPY (EGD) WITH PROPOFOL;  Surgeon: Leonie Green, MD;  Location: [facility] ENDOSCOPY;  Service:  Endoscopy;  Laterality: N/A;  . FRACTURE SURGERY    . MOHS SURGERY    . PEG PLACEMENT N/A 12/11/2016   Procedure: PERCUTANEOUS ENDOSCOPIC  GASTROSTOMY (PEG) PLACEMENT;  Surgeon: Leonie Green, MD;  Location: [facility] ENDOSCOPY;  Service: Endoscopy;  Laterality: N/A;  . PERIPHERAL VASCULAR CATHETERIZATION N/A 11/29/2015   Procedure: Glori Luis Cath Insertion;  Surgeon: Algernon Huxley, MD;  Location: [facility] CV LAB;  Service: Cardiovascular;  Laterality: N/A;    SOCIAL HISTORY: Social History   Social History  . Marital status: Married    Spouse name: N/A  . Number of children: N/A  . Years of education: N/A   Occupational History  . Not on file.   Social History Main Topics  . Smoking status: Former Smoker    Packs/day: 1.00    Years: 40.00    Types: Cigarettes    Quit date: 06/03/1993  . Smokeless tobacco: Never Used  . Alcohol use No  . Drug use: No  . Sexual activity: No   Other Topics Concern  . Not on file   Social History Narrative  . No narrative on file    FAMILY HISTORY: Family History  Problem Relation Age of Onset  . Heart disease Brother   . Cancer - Colon Sister   . Cancer Sister   . Other Brother        CABG    ALLERGIES:  is allergic to codeine; allopurinol; simvastatin; and uloric [febuxostat].  MEDICATIONS:  Current Facility-Administered Medications  Medication Dose Route Frequency Provider Last Rate Last Dose  . 0.9 %  sodium chloride infusion   Intravenous Continuous Mody, Sital, MD      . 0.9 %  sodium chloride infusion   Intravenous Once Bettey Costa, MD      . acetaminophen (TYLENOL) tablet 650 mg  650 mg Oral Q6H PRN Bettey Costa, MD       Or  . acetaminophen (TYLENOL) suppository 650 mg  650 mg Rectal Q6H PRN Bettey Costa, MD      . Derrill Memo ON 02/08/2017] amLODipine (NORVASC) tablet 2.5 mg  2.5 mg Oral Daily Mody, Sital, MD      . apixaban (ELIQUIS) tablet 5 mg  5 mg Oral BID Bettey Costa, MD      . Derrill Memo ON 02/08/2017] atorvastatin (LIPITOR) tablet 20 mg  20 mg Oral Q breakfast Mody, Sital, MD      . diphenoxylate-atropine (LOMOTIL) 2.5-0.025 MG per tablet 1 tablet  1 tablet Oral  QID PRN Mody, Sital, MD      . dorzolamide (TRUSOPT) 2 % ophthalmic solution 1 drop  1 drop Both Eyes BID Mody, Sital, MD      . finasteride (PROSCAR) tablet 5 mg  5 mg Oral Daily Mody, Sital, MD      . free water 120 mL  120 mL Per Tube QID Benjie Karvonen, Sital, MD      . furosemide (LASIX) injection 20 mg  20 mg Intravenous Once Bettey Costa, MD      . Derrill Memo ON 02/08/2017] insulin aspart (novoLOG) injection 0-20 Units  0-20 Units Subcutaneous TID WC Mody, Sital, MD      . insulin glargine (LANTUS) injection 10 Units  10 Units Subcutaneous QHS Mody, Sital, MD      . latanoprost (XALATAN) 0.005 % ophthalmic solution 1 drop  1 drop Both Eyes QHS Mody, Sital, MD      . Derrill Memo ON 02/08/2017] metoprolol succinate (TOPROL-XL) 24 hr tablet 12.5 mg  12.5  mg Oral Daily Mody, Sital, MD      . ondansetron (ZOFRAN) tablet 4 mg  4 mg Oral Q6H PRN Bettey Costa, MD       Or  . ondansetron (ZOFRAN) injection 4 mg  4 mg Intravenous Q6H PRN Mody, Sital, MD      . senna-docusate (Senokot-S) tablet 1 tablet  1 tablet Oral QHS PRN Bettey Costa, MD      . terazosin (HYTRIN) capsule 10 mg  10 mg Oral QHS Mody, Sital, MD      . timolol (TIMOPTIC) 0.5 % ophthalmic solution 1 drop  1 drop Both Eyes BID Mody, Sital, MD      . vancomycin (VANCOCIN) IVPB 1000 mg/200 mL premix  1,000 mg Intravenous Once Harvest Dark, MD      . Derrill Memo ON 02/08/2017] vitamin B-12 (CYANOCOBALAMIN) tablet 500 mcg  500 mcg Oral Daily Bettey Costa, MD      . Derrill Memo ON 2017-02-23] Vitamin D (Ergocalciferol) (DRISDOL) capsule 50,000 Units  50,000 Units Oral Q7 days Bettey Costa, MD          .  PHYSICAL EXAMINATION:  Vitals:   02/12/2017 1730 02/04/2017 1915  BP: (!) 110/53 (!) 119/50  Pulse: 86 84  Resp: (!) 23   Temp:  98.1 F (36.7 C)  SpO2: 99% 98%   Filed Weights   02/16/2017 1244  Weight: 167 lb (75.8 kg)    GENERAL: Moderately nourished well-developed; Drowsy no distress and comfortable.   Accompanied by family. EYES: no pallor or  icterus OROPHARYNX: no thrush or ulceration. NECK: supple, no masses felt LYMPH:  no palpable lymphadenopathy in the cervical, axillary or inguinal regions LUNGS: decreased breath sounds to auscultation at bases and  No wheeze or crackles HEART/CVS: regular rate & rhythm and no murmurs; 2+ bilateral lower extremity edema ABDOMEN: abdomen soft, non-tender and normal bowel sounds; positive for PEG tube Musculoskeletal:no cyanosis of digits and no clubbing; multiple gouty arthritic changes noted in the hands. PSYCH: Drowsy & oriented x 1-2.  NEURO: no focal motor/sensory deficits SKIN:  no rashes or significant lesions  LABORATORY DATA:  I have reviewed the data as listed Lab Results  Component Value Date   WBC 6.3 02/20/2017   HGB 7.1 (L) 02/09/2017   HCT 23.6 (L) 03/01/2017   MCV 94.3 02/06/2017   PLT 265 02/03/2017    Recent Labs  12/06/16 1132 12/10/16 1659  01/06/17 1359 02/22/2017 1144 02/14/2017 1430  NA 139 140  < > 135 148* 149*  K 4.4 3.8  < > 4.4 5.3* 5.7*  CL 107 109  < > 104 116* 120*  CO2 23 21*  < > 20* 18* 22  GLUCOSE 125* 112*  < > 166* 556* 493*  BUN 42* 39*  < > 70* 94* 91*  CREATININE 1.73* 1.32*  < > 1.51* 1.87* 1.77*  CALCIUM 9.1 8.6*  < > 8.5* 8.5* 8.1*  GFRNONAA 35* 48*  < > 41* 32* 34*  GFRAA 40* 56*  < > 47* 37* 39*  PROT 7.9 7.0  --   --   --  6.7  ALBUMIN 3.4* 2.9*  --   --   --  2.1*  AST 17 15  --   --   --  39  ALT 7* 9*  --   --   --  28  ALKPHOS 115 120  --   --   --  382*  BILITOT 0.6 0.5  --   --   --  0.4  < > = values in this interval not displayed.  RADIOGRAPHIC STUDIES: I have personally reviewed the radiological images as listed and agreed with the findings in the report. Dg Chest 1 View  Result Date: 02/11/2017 CLINICAL DATA:  Hyperglycemia.  Esophageal cancer. EXAM: CHEST 1 VIEW COMPARISON:  12/20/2016. FINDINGS: Interval borderline enlarged cardiac silhouette. Stable diffusely prominent interstitial markings and right jugular  catheter. Small amount of linear density of both lung bases. Minimal right pleural effusion. Diffuse osteopenia. IMPRESSION: 1. Interval borderline cardiomegaly. 2. Minimal right pleural effusion, without significant change. 3. Stable chronic interstitial lung disease. 4. Minimal bibasilar linear atelectasis. Electronically Signed   By: Claudie Revering M.D.   On: 03/01/2017 18:01   US Venous Img Lower Unilateral Left  Result Date: 02/17/2017 CLINICAL DATA:  Left lower extremity pain and edema EXAM: Left LOWER EXTREMITY VENOUS DOPPLER ULTRASOUND TECHNIQUE: Gray-scale sonography with graded compression, as well as color Doppler and duplex ultrasound were performed to evaluate the lower extremity deep venous systems from the level of the common femoral vein and including the common femoral, femoral, profunda femoral, popliteal and calf veins including the posterior tibial, peroneal and gastrocnemius veins when visible. The superficial great saphenous vein was also interrogated. Spectral Doppler was utilized to evaluate flow at rest and with distal augmentation maneuvers in the common femoral, femoral and popliteal veins. COMPARISON:  None. FINDINGS: Contralateral Common Femoral Vein: Respiratory phasicity is normal and symmetric with the symptomatic side. No evidence of thrombus. Normal compressibility. Common Femoral Vein: No evidence of thrombus. Normal compressibility, respiratory phasicity and response to augmentation. Saphenofemoral Junction: No evidence of thrombus. Normal compressibility and flow on color Doppler imaging. Profunda Femoral Vein: No evidence of thrombus. Normal compressibility and flow on color Doppler imaging. Femoral Vein: No evidence of thrombus. Normal compressibility, respiratory phasicity and response to augmentation. Popliteal Vein: No evidence of thrombus. Normal compressibility, respiratory phasicity and response to augmentation. Calf Veins: No evidence of thrombus. Normal compressibility  and flow on color Doppler imaging. Other Findings:  None. IMPRESSION: No evidence of DVT within the left lower extremity. Electronically Signed   By: Donavan Foil M.D.   On: 02/25/2017 15:37    ASSESSMENT & PLAN:   # 83 year old male patient with metastatic esophageal adenocarcinoma currently hospice- is admitted to the hospital for severe diarrhea/elevated blood sugars.  # Metastatic esophageal adenocarcinoma- recurrent- poor candidate for any systemic therapy. Long discussion the patient wife; son- would not recommend any further therapy; continue hospice.  # Severe diarrhea- unclear etiology. Tube feeds versus infectious causes. Check stool GI PCR; stool C. Difficile.  # Dehydration/worsening renal insufficiency/hyperkalemia- IV fluids status post Kayexalate  # Elevated blood sugars in the range of 550- unclear etiology. On insulin.  # Anemia- likely secondary to chronic GI bleed from his esophageal cancer. Agree with 1 unit of PRBC transfusion  #DNR/DNI- discussed the patient's son by the bedside- that overall prognosis is poor. They do understand; and are concerned about his disposition. Family thinks Patient's wife cannot take care of him at home even with hospice. Patient will likely need hospice at SNF/hospice home- based upon his clinical situation over the next 2-3 days.  Thank you Dr. Benjie Karvonen for allowing me to participate in the care of your pleasant patient. Please do not hesitate to contact me with questions or concerns in the interim.   All questions were answered. The patient knows to call the clinic with any problems, questions or concerns.  Dr.Finnegan on call over the  weekend for any acute issues.     Cammie Sickle, MD 02/05/2017 8:07 PM

## 2017-02-07 NOTE — ED Triage Notes (Signed)
Pt in via POV, sent over from [place] due to blood glucose in 500's.  Pt reports being there for a follow up visit in regards to Esophageal Cancer.  Pt denies any hx of diabetes or hyperglycemia.  Pt denies any complaints at this time.  Vitals WDL, NAD noted at this time.

## 2017-02-07 NOTE — ED Provider Notes (Addendum)
[facility] Emergency Department Provider Note  Time seen: 2:53 PM  I have reviewed the triage vital signs and the nursing notes.   HISTORY  Chief Complaint Hyperglycemia    HPI Marcus [name] is a 83 y.o. male With a past medical history of metastatic esophageal cancer, gastric reflux, hypertension, hyperlipidemia, CVA on anticoagulation, history of DVT, presents to the emergency department for hyperglycemia. According to family they were going to Dr. Charlette Caffey office today for a scheduled appointment. Routine blood work was performed prior to appointment and found to have an elevated blood glucose greater than 500 without a history of diabetes in the past.patient was sent to the emergency department for further evaluation. Denies any fever. Patient does state left leg/foot pain which started over the last several days. Patient has a history of left lower extremity swelling since he had a stroke 2-1/2 years ago.patient does state increased urinary frequency. Patient has a G-tube for feeds, does not take anything orally any longer.  Past Medical History:  Diagnosis Date  . Aphakia of right eye   . Arthritis   . Cancer ([place])    SKIN  . Enlarged prostate without lower urinary tract symptoms (luts)   . Esophageal cancer ([place])   . GERD (gastroesophageal reflux disease)   . Glaucoma   . Gout   . History of CVA (cerebrovascular accident)    Cerebral infarction Nov. 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 History of Doppler ultrasound    dopplers revealed mild left ICA stenosis which has remained stable, occluded left subclavian artery with retrograde left vertebral filling and moderately severe right subclavian artery stenosis. he has no symptoms of upper extermity claudication or subclavian steal symptoms.   . History of DVT (deep vein thrombosis)    right arm  . History of stress test 09/02/2009   abnormal myocardial perfusion scan demonstrating an  attenuation defect in the inferior region of the myocardium. No ischemia or infarct/scar is seen in the remaining myocardium. No prior study available for comparison. Abnormal myocardial study EF 79%  . Hyperlipemia   . Hypertension   . Macular degeneration   . Pulmonary embolus ([place])   . Stroke ([place])   . Subclavian artery stenosis [facility])     Patient Active Problem List   Diagnosis Date Noted  . Dysphagia 12/10/2016  . Malnutrition of moderate degree 02/26/2016  . Pulmonary embolus ([place]) 02/23/2016  . Encounter for antineoplastic chemotherapy 12/19/2015  . Malignant neoplasm of lower third of esophagus ([place]) 12/18/2015  . CKD (chronic kidney disease), stage III 11/15/2015  . History of stroke 11/15/2015  . Bilateral carotid bruits 11/07/2015  . Lacunar infarct, acute ([place]) 04/26/2015  . Cerebral thrombosis with cerebral infarction ([place]) 12/25/2014  . Paresthesias of right arma and leg 12/24/2014  . Renal insufficiency 12/24/2014  . Glaucoma 12/24/2014  . BPH (benign prostatic hyperplasia) 12/24/2014  . Tophaceous gout 12/24/2014  . Subclavian artery stenosis ([place]) 12/03/2012  . Essential hypertension 12/03/2012  . Hyperlipidemia 12/03/2012    Past Surgical History:  Procedure Laterality Date  . CATARACT EXTRACTION    . COLONOSCOPY  2002  . ESOPHAGOGASTRODUODENOSCOPY (EGD) WITH PROPOFOL N/A 11/10/2015   Procedure: ESOPHAGOGASTRODUODENOSCOPY (EGD) WITH PROPOFOL;  Surgeon: Manya Silvas, MD;  Location: [facility] ENDOSCOPY;  Service: Endoscopy;  Laterality: N/A;  . ESOPHAGOGASTRODUODENOSCOPY (EGD) WITH PROPOFOL N/A 04/29/2016   Procedure: ESOPHAGOGASTRODUODENOSCOPY (EGD) WITH PROPOFOL;  Surgeon: Manya Silvas, MD;  Location: [facility] ENDOSCOPY;  Service: Endoscopy;  Laterality: N/A;  . ESOPHAGOGASTRODUODENOSCOPY (EGD) WITH PROPOFOL N/A 10/07/2016   Procedure: ESOPHAGOGASTRODUODENOSCOPY (EGD) WITH PROPOFOL;  Surgeon: Manya Silvas, MD;  Location: [facility] ENDOSCOPY;  Service: Endoscopy;   Laterality: N/A;  . ESOPHAGOGASTRODUODENOSCOPY (EGD) WITH PROPOFOL N/A 12/11/2016   Procedure: ESOPHAGOGASTRODUODENOSCOPY (EGD) WITH PROPOFOL;  Surgeon: Leonie Green, MD;  Location: [facility] ENDOSCOPY;  Service: Endoscopy;  Laterality: N/A;  . FRACTURE SURGERY    . MOHS SURGERY    . PEG PLACEMENT N/A 12/11/2016   Procedure: PERCUTANEOUS ENDOSCOPIC GASTROSTOMY (PEG) PLACEMENT;  Surgeon: Leonie Green, MD;  Location: [facility] ENDOSCOPY;  Service: Endoscopy;  Laterality: N/A;  . PERIPHERAL VASCULAR CATHETERIZATION N/A 11/29/2015   Procedure: Glori Luis Cath Insertion;  Surgeon: Algernon Huxley, MD;  Location: [facility] CV LAB;  Service: Cardiovascular;  Laterality: N/A;    Prior to Admission medications   Medication Sig Start Date End Date Taking? Authorizing Provider  acetaminophen (TYLENOL) 500 MG tablet Take 500 mg by mouth every 6 (six) hours as needed.    [provider]  amLODipine (NORVASC) 2.5 MG tablet Take 2.5 mg by mouth daily. 02/11/16   [provider]  amoxicillin (AMOXIL) 250 MG/5ML suspension Place 10 mLs (500 mg total) into feeding tube 3 (three) times daily. 01/22/17   Cammie Sickle, MD  apixaban (ELIQUIS) 5 MG TABS tablet Take 1 tablet (5 mg total) by mouth 2 (two) times daily. Start on 03/03/2016 03/03/16   Cristal Ford, DO  atorvastatin (LIPITOR) 20 MG tablet Take 1 tablet (20 mg total) by mouth daily at 6 PM. Patient taking differently: Take 20 mg by mouth daily with breakfast.  12/26/14   Delfina Redwood, MD  calcium carbonate (TUMS EX) 750 MG chewable tablet Chew 1 tablet by mouth daily.    [provider]  diphenoxylate-atropine (LOMOTIL) 2.5-0.025 MG tablet Take 1 tablet by mouth 4 (four) times daily as needed for diarrhea or loose stools. 01/22/17   Cammie Sickle, MD  dorzolamide (TRUSOPT) 2 % ophthalmic solution Place 1 drop into both eyes 2 (two) times daily.    [provider]  finasteride (PROSCAR) 5 MG tablet Take  5 mg by mouth daily. 12/06/14   [provider]  latanoprost (XALATAN) 0.005 % ophthalmic solution Place 1 drop into both eyes at bedtime.    [provider]  metoprolol succinate (TOPROL-XL) 25 MG 24 hr tablet TAKE ONE TABLET (25 MG TOTAL) BY MOUTH DAILY. 04/19/15   [provider]  Nutritional Supplements (FEEDING SUPPLEMENT, JEVITY 1.5 CAL,) LIQD Give 3 cans per day 01/16/17   Cammie Sickle, MD  Nutritional Supplements (FEEDING SUPPLEMENT, OSMOLITE 1.5 CAL,) LIQD Place 360 mLs into feeding tube 4 (four) times daily. 12/12/16   Loletha Grayer, MD  oxyCODONE (ROXICODONE) 5 MG/5ML solution 5 ml Every 6-8 hours as needed for pain thu PEG tube. Patient not taking: Reported on 01/22/2017 01/06/17   Cammie Sickle, MD  terazosin (HYTRIN) 10 MG capsule Take 10 mg by mouth at bedtime. 05/01/16   [provider]  timolol (TIMOPTIC) 0.5 % ophthalmic solution Place 1 drop into both eyes 2 (two) times daily. 10/27/14   [provider]  vitamin B-12 (CYANOCOBALAMIN) 500 MCG tablet Take 500 mcg by mouth daily.    [provider]  Vitamin D, Ergocalciferol, (DRISDOL) 50000 UNITS CAPS Take 50,000 Units by mouth every 7 (seven) days.    [provider]  Water For Irrigation, Sterile (FREE WATER) SOLN Place 120 mLs into feeding tube  4 (four) times daily. 12/12/16   Loletha Grayer, MD    Allergies  Allergen Reactions  . Codeine Other (See Comments) and Anaphylaxis    Swollen tongue, numb around mouth, slurred speech Swollen tongue, numb around mouth, slurred speech  . Allopurinol Other (See Comments)    Hand pain  . Simvastatin Other (See Comments)    myalgias  . Uloric [Febuxostat] Other (See Comments)    Stomach pain    Family History  Problem Relation Age of Onset  . Heart disease Brother   . Cancer - Colon Sister   . Cancer Sister   . Other Brother        CABG    Social History Social History  Substance Use Topics  .  Smoking status: Former Smoker    Packs/day: 1.00    Years: 40.00    Types: Cigarettes    Quit date: 06/03/1993  . Smokeless tobacco: Never Used  . Alcohol use No    Review of Systems Constitutional: Negative for fever. Cardiovascular: Negative for chest pain. Respiratory: Negative for shortness of breath. Gastrointestinal: Negative for abdominal pain Genitourinary: Negative for dysuria.positive for frequency Musculoskeletal: left foot pain Skin: patient does have mild redness/erythema of the left foot/lower leg, family states this appears normal. Neurological: Negative for headache All other ROS negative  ____________________________________________   PHYSICAL EXAM:  VITAL SIGNS: ED Triage Vitals  Enc Vitals Group     BP 02/16/2017 1243 (!) 116/52     Pulse Rate 02/06/2017 1243 86     Resp 02/26/2017 1243 (!) 24     Temp 02/24/2017 1243 (!) 97.5 F (36.4 C)     Temp Source 02/01/2017 1243 Oral     SpO2 02/23/2017 1243 99 %     Weight 02/18/2017 1244 167 lb (75.8 kg)     Height 02/09/2017 1244 6\' 2"  (1.88 m)     Head Circumference --      Peak Flow --      Pain Score 02/24/2017 1243 0     Pain Loc --      Pain Edu? --      Excl. in [place]? --     Constitutional: Alert and oriented. Well appearing and in no distress. Eyes: Normal exam ENT   Head: Normocephalic and atraumatic.   Mouth/Throat: Mucous membranes are moist. Cardiovascular: Normal rate, regular rhythm. No murmur Respiratory: Normal respiratory effort without tachypnea nor retractions. Breath sounds are clear  Gastrointestinal: Soft and nontender. No distention. Musculoskeletal: Nontender with normal range of motion in all extremities.  Neurologic:  Normal speech and language. No gross focal neurologic deficits  Skin:  Skin is warm, dry and intact.  Psychiatric: Mood and affect are normal.   ____________________________________________    EKG  EKG reviewed and interpreted by myself shows atrial fibrillation at 93  bpm, narrow QRS, normal axis, normal intervals, nonspecific ST changes.  ____________________________________________    RADIOLOGY  ultrasound negative for DVT  ____________________________________________   INITIAL IMPRESSION / ASSESSMENT AND PLAN / ED COURSE  Pertinent labs & imaging results that were available during my care of the patient were reviewed by me and considered in my medical decision making (see chart for details).  patient presents to the emergency department for hyperglycemia. Patient denies any history of diabetes, blood glucose greater than 500 for outpatient labs. We will repeat chemistry in the emergency department to verify labs. We will IV hydrate. Patient does have moderate left lower extremity edema with erythema and tenderness we  will obtain an ultrasound. overall the patient appears weak/fatigued. Son is with the patient states this is baseline/normal for the patient. States he can normally ambulate 10-15 feet at most, and states it is a struggle even at baseline. States overall the patient is largely normal-appearing/baseline appearance, but no history of hyperglycemia.  patient's labs show hyperglycemia around 500 with a potassium of 5.7. Patient's ultrasound is negative for DVT.however given the patient's hyperkalemia with hyperglycemia, new onset and generalized weakness will admit to the hospital for further workup and treatment.  ultrasound is negative for DVT however given the erythema and tenderness with new onset hyperglycemia we will cover with vancomycin for possible cellulitis.    ____________________________________________   FINAL CLINICAL IMPRESSION(S) / ED DIAGNOSES  new-onset diabetes Hyperglycemia left lower extremity cellulitis   Harvest Dark, MD 02/06/2017 1651    Harvest Dark, MD 02/15/2017 1701

## 2017-02-07 NOTE — Telephone Encounter (Signed)
Lab called to report Glucose of 556. Attempted to reach Lorretta Harp, NP but unable to reach her so will attempt to reach Dr B

## 2017-02-07 NOTE — Telephone Encounter (Signed)
Report called to ER first nurse

## 2017-02-07 NOTE — H&P (Signed)
[place] [facility] [place] NAME: Marcus [name]    MR#:  [id number]  DATE OF BIRTH:  [birth year]  DATE OF ADMISSION:  02/17/2017  PRIMARY CARE PHYSICIAN: Chesley Noon, MD   REQUESTING/REFERRING PHYSICIAN: Dr Mamie Nick  CHIEF COMPLAINT:   Elevated blood sugar HISTORY OF PRESENT ILLNESS:  Marcus [name]  is a 83 y.o. male with a known history of Metastatic esophageal cancer with poor performance status no longer a candidate for further standard chemotherapy, gout and history of CVA who presents today to the emergency room due to hyperglycemia. Patient had a follow-up with Dr. Donella Stade today and went to get labs drawn. It was noted that patient's blood sugars were in the 500 range so he sent to the ER for further evaluation. Patient was started on tube feeds approximately 2 months ago. In July his blood sugars were normal. At the beginning of August blood sugar was 166. He has no history of diabetes.  He has had diarrhea from the tube feeds. He denies fever, chills or cough. He has chronic lower extremity edema. He is pretty much confined to his bed on most days due to generalized weakness. His wife is his caregiver. Patient does have hospice care.  In the emergency room in addition to hyperglycemia he is noted to have a sodium of 149 and a potassium of 5.7 his creatinine is slightly elevated from baseline. He is also noted to have a hemoglobin of 7.1. He denies dark color stools, hematemesis or hematochezia.  PAST MEDICAL HISTORY:   Past Medical History:  Diagnosis Date  . Aphakia of right eye   . Arthritis   . Cancer ([place])    SKIN  . Enlarged prostate without lower urinary tract symptoms (luts)   . Esophageal cancer ([place])   . GERD (gastroesophageal reflux disease)   . Glaucoma   . Gout   . History of CVA (cerebrovascular accident)    Cerebral infarction Nov. 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 History of Doppler ultrasound    dopplers revealed mild left ICA stenosis which has remained stable, occluded left subclavian artery with retrograde left vertebral filling and moderately severe right subclavian artery stenosis. he has no symptoms of upper extermity claudication or subclavian steal symptoms.   . History of DVT (deep vein thrombosis)    right arm  . History of stress test 09/02/2009   abnormal myocardial perfusion scan demonstrating an attenuation defect in the inferior region of the myocardium. No ischemia or infarct/scar is seen in the remaining myocardium. No prior study available for comparison. Abnormal myocardial study EF 79%  . Hyperlipemia   . Hypertension   . Macular degeneration   . Pulmonary embolus ([place])   . Stroke ([place])   . Subclavian artery stenosis ([facility])     PAST SURGICAL HISTORY:   Past Surgical History:  Procedure Laterality Date  . CATARACT EXTRACTION    . COLONOSCOPY  2002  . ESOPHAGOGASTRODUODENOSCOPY (EGD) WITH PROPOFOL N/A 11/10/2015   Procedure: ESOPHAGOGASTRODUODENOSCOPY (EGD) WITH PROPOFOL;  Surgeon: Manya Silvas, MD;  Location: [facility] ENDOSCOPY;  Service: Endoscopy;  Laterality: N/A;  . ESOPHAGOGASTRODUODENOSCOPY (EGD) WITH PROPOFOL N/A 04/29/2016   Procedure: ESOPHAGOGASTRODUODENOSCOPY (EGD) WITH PROPOFOL;  Surgeon: Manya Silvas, MD;  Location: [facility] ENDOSCOPY;  Service: Endoscopy;  Laterality: N/A;  . ESOPHAGOGASTRODUODENOSCOPY (EGD) WITH PROPOFOL N/A 10/07/2016   Procedure: ESOPHAGOGASTRODUODENOSCOPY (EGD) WITH PROPOFOL;  Surgeon: Manya Silvas, MD;  Location: [facility] ENDOSCOPY;  Service: Endoscopy;  Laterality: N/A;  . ESOPHAGOGASTRODUODENOSCOPY (EGD) WITH PROPOFOL N/A 12/11/2016   Procedure: ESOPHAGOGASTRODUODENOSCOPY (EGD) WITH PROPOFOL;  Surgeon: Leonie Green, MD;  Location: [facility] ENDOSCOPY;  Service: Endoscopy;  Laterality: N/A;  . FRACTURE SURGERY    . MOHS SURGERY    . PEG PLACEMENT N/A 12/11/2016   Procedure: PERCUTANEOUS ENDOSCOPIC GASTROSTOMY (PEG) PLACEMENT;   Surgeon: Leonie Green, MD;  Location: [facility] ENDOSCOPY;  Service: Endoscopy;  Laterality: N/A;  . PERIPHERAL VASCULAR CATHETERIZATION N/A 11/29/2015   Procedure: Glori Luis Cath Insertion;  Surgeon: Algernon Huxley, MD;  Location: [place] CV LAB;  Service: Cardiovascular;  Laterality: N/A;    SOCIAL HISTORY:   Social History  Substance Use Topics  . Smoking status: Former Smoker    Packs/day: 1.00    Years: 40.00    Types: Cigarettes    Quit date: 06/03/1993  . Smokeless tobacco: Never Used  . Alcohol use No    FAMILY HISTORY:   Family History  Problem Relation Age of Onset  . Heart disease Brother   . Cancer - Colon Sister   . Cancer Sister   . Other Brother        CABG    DRUG ALLERGIES:   Allergies  Allergen Reactions  . Codeine Other (See Comments) and Anaphylaxis    Swollen tongue, numb around mouth, slurred speech Swollen tongue, numb around mouth, slurred speech  . Allopurinol Other (See Comments)    Hand pain  . Simvastatin Other (See Comments)    myalgias  . Uloric [Febuxostat] Other (See Comments)    Stomach pain    REVIEW OF SYSTEMS:   Review of Systems  Constitutional: Positive for malaise/fatigue. Negative for chills, fever and weight loss.  HENT: Negative.  Negative for ear discharge, ear pain, hearing loss, nosebleeds and sore throat.   Eyes: Negative.  Negative for blurred vision and pain.  Respiratory: Negative.  Negative for cough, hemoptysis, shortness of breath and wheezing.   Cardiovascular: Positive for leg swelling. Negative for chest pain and palpitations.  Gastrointestinal: Positive for diarrhea. Negative for abdominal pain, blood in stool, nausea and vomiting.  Genitourinary: Negative.  Negative for dysuria.  Musculoskeletal: Negative.  Negative for back pain.       Gout  Skin: Negative.   Neurological: Positive for weakness. Negative for dizziness, tremors, speech change, focal weakness, seizures and headaches.  Endo/Heme/Allergies:  Negative.  Does not bruise/bleed easily.  Psychiatric/Behavioral: Negative.  Negative for depression, hallucinations and suicidal ideas.    MEDICATIONS AT HOME:   Prior to Admission medications   Medication Sig Start Date End Date Taking? Authorizing Provider  amLODipine (NORVASC) 2.5 MG tablet Take 2.5 mg by mouth daily. 02/11/16  Yes [provider]  apixaban (ELIQUIS) 5 MG TABS tablet Take 1 tablet (5 mg total) by mouth 2 (two) times daily. Start on 03/03/2016 03/03/16  Yes Mikhail, [place], DO  atorvastatin (LIPITOR) 20 MG tablet Take 1 tablet (20 mg total) by mouth daily at 6 PM. Patient taking differently: Take 20 mg by mouth daily with breakfast.  12/26/14  Yes Delfina Redwood, MD  dorzolamide (TRUSOPT) 2 % ophthalmic solution Place 1 drop into both eyes 2 (two) times daily.   Yes [provider]  finasteride (PROSCAR) 5 MG tablet Take 5 mg by mouth daily. 12/06/14  Yes [provider]  latanoprost (XALATAN) 0.005 % ophthalmic solution Place 1 drop into both eyes at bedtime.   Yes [provider]  metoprolol succinate (TOPROL-XL)  25 MG 24 hr tablet 12.5 mg daily 04/19/15  Yes [provider]  terazosin (HYTRIN) 10 MG capsule Take 10 mg by mouth at bedtime. 05/01/16  Yes [provider]  timolol (TIMOPTIC) 0.5 % ophthalmic solution Place 1 drop into both eyes 2 (two) times daily. 10/27/14  Yes [provider]  vitamin B-12 (CYANOCOBALAMIN) 500 MCG tablet Take 500 mcg by mouth daily.   Yes [provider]  Vitamin D, Ergocalciferol, (DRISDOL) 50000 UNITS CAPS Take 50,000 Units by mouth every 7 (seven) days.   Yes [provider]  acetaminophen (TYLENOL) 500 MG tablet Take 500 mg by mouth every 6 (six) hours as needed.    [provider]  amoxicillin (AMOXIL) 250 MG/5ML suspension Place 10 mLs (500 mg total) into feeding tube 3 (three) times daily. Patient not taking: Reported on 02/03/2017 01/22/17    Cammie Sickle, MD  diphenoxylate-atropine (LOMOTIL) 2.5-0.025 MG tablet Take 1 tablet by mouth 4 (four) times daily as needed for diarrhea or loose stools. 01/22/17   Cammie Sickle, MD  Nutritional Supplements (FEEDING SUPPLEMENT, JEVITY 1.5 CAL,) LIQD Give 3 cans per day 01/16/17   Cammie Sickle, MD  Nutritional Supplements (FEEDING SUPPLEMENT, OSMOLITE 1.5 CAL,) LIQD Place 360 mLs into feeding tube 4 (four) times daily. 12/12/16   Loletha Grayer, MD  oxyCODONE (ROXICODONE) 5 MG/5ML solution 5 ml Every 6-8 hours as needed for pain thu PEG tube. Patient not taking: Reported on 01/22/2017 01/06/17   Cammie Sickle, MD  Water For Irrigation, Sterile (FREE WATER) SOLN Place 120 mLs into feeding tube 4 (four) times daily. 12/12/16   Loletha Grayer, MD      VITAL SIGNS:  Blood pressure (!) 116/59, pulse 95, temperature (!) 97.5 F (36.4 C), temperature source Oral, resp. rate (!) 40, height 6\' 2"  (1.88 m), weight 75.8 kg (167 lb), SpO2 97 %.  PHYSICAL EXAMINATION:   Physical Exam  Constitutional: He is oriented to person, place, and time. No distress.  Chronically ill-appearing  HENT:  Head: Normocephalic.  Eyes: No scleral icterus.  Neck: Normal range of motion. Neck supple. No JVD present. No tracheal deviation present.  Cardiovascular: Normal rate, regular rhythm and normal heart sounds.  Exam reveals no gallop and no friction rub.   No murmur heard. Pulmonary/Chest: Effort normal and breath sounds normal. No respiratory distress. He has no wheezes. He has no rales. He exhibits no tenderness.  Abdominal: Soft. Bowel sounds are normal. He exhibits no distension and no mass. There is no tenderness. There is no rebound and no guarding.  PEG tube in place without discharge  Musculoskeletal: Normal range of motion. He exhibits edema (3+ LEE).  Neurological: He is alert and oriented to person, place, and time.  Skin: Skin is warm. No rash noted. No erythema.  Tophi  gout on fingers  Psychiatric: Affect and judgment normal.      LABORATORY PANEL:   CBC  Recent Labs Lab 02/03/2017 1144  WBC 6.3  HGB 7.1*  HCT 23.6*  PLT 265   ------------------------------------------------------------------------------------------------------------------  Chemistries   Recent Labs Lab 02/01/2017 1144 03/02/2017 1430  NA 148* 149*  K 5.3* 5.7*  CL 116* 120*  CO2 18* 22  GLUCOSE 556* 493*  BUN 94* 91*  CREATININE 1.87* 1.77*  CALCIUM 8.5* 8.1*  MG 2.4  --   AST  --  39  ALT  --  28  ALKPHOS  --  382*  BILITOT  --  0.4   ------------------------------------------------------------------------------------------------------------------  Cardiac Enzymes No results for input(s): TROPONINI in the last 168 hours. ------------------------------------------------------------------------------------------------------------------  RADIOLOGY:  US Venous Img Lower Unilateral Left  Result Date: 02/12/2017 CLINICAL DATA:  Left lower extremity pain and edema EXAM: Left LOWER EXTREMITY VENOUS DOPPLER ULTRASOUND TECHNIQUE: Gray-scale sonography with graded compression, as well as color Doppler and duplex ultrasound were performed to evaluate the lower extremity deep venous systems from the level of the common femoral vein and including the common femoral, femoral, profunda femoral, popliteal and calf veins including the posterior tibial, peroneal and gastrocnemius veins when visible. The superficial great saphenous vein was also interrogated. Spectral Doppler was utilized to evaluate flow at rest and with distal augmentation maneuvers in the common femoral, femoral and popliteal veins. COMPARISON:  None. FINDINGS: Contralateral Common Femoral Vein: Respiratory phasicity is normal and symmetric with the symptomatic side. No evidence of thrombus. Normal compressibility. Common Femoral Vein: No evidence of thrombus. Normal compressibility, respiratory phasicity and response to  augmentation. Saphenofemoral Junction: No evidence of thrombus. Normal compressibility and flow on color Doppler imaging. Profunda Femoral Vein: No evidence of thrombus. Normal compressibility and flow on color Doppler imaging. Femoral Vein: No evidence of thrombus. Normal compressibility, respiratory phasicity and response to augmentation. Popliteal Vein: No evidence of thrombus. Normal compressibility, respiratory phasicity and response to augmentation. Calf Veins: No evidence of thrombus. Normal compressibility and flow on color Doppler imaging. Other Findings:  None. IMPRESSION: No evidence of DVT within the left lower extremity. Electronically Signed   By: Donavan Foil M.D.   On: 02/09/2017 15:37    EKG:  ?? Atrial fibrillation heart rate 93 no ST elevation or depression  IMPRESSION AND PLAN:   83 year old male with metastatic esophageal cancer status post PEG tube who presents with abnormal labs showing hyperglycemia.  1. Hyperglycemia without history of diabetes Check hemoglobin A1c Start Lantus 10 units at bedtime Sliding scale insulin and started Diabetes coordinator consultation requested  2. Hyperkalemia: This has been treated with insulin and IV fluids Continue IV fluids 1 dose of Kayexalate Repeat potassium later this evening  3. Acute on chronic kidney disease stage III: Continue IV fluids and repeat BMP in a.m.  4. Metastatic esophageal cancer with hospice and PEG feedings Dietitian consultation in a.m. to restart tube feeds.  5. acute on chronic anemia: Patient had iron studies August 6 which are consistent with iron deficiency anemia and chronic disease Patient has consented for PRBC Transfuse one unit tonight and repeat CBC in a.m.  6. Lower extremity edema: Dopplers negative for DVT Last echocardiogram shows normal ejection fraction however does mention diastolic dysfunction Will check chest x-ray to evaluate for underlying pulmonary edema Patient will receive 1  dose of Lasix after blood transfusion. Albumin level II.1 which is likely contributing to lower extremity edema.  7. History of PE/DVT currently on anticoagulation  8. Atrial fibrillation : Continue Eliquis and metoprolol  9.hyperlipidemia: Continue statin  10. History of CVA: Continue anticoagulation and Lipitor 11. Essential hypertension: Continue metoprolol  12. BPH: Continue finasteride and Hytrin  All the records are reviewed and case discussed with ED provider. Management plans discussed with the patient and family and they are in agreement  CODE STATUS: DNR  TOTAL TIME TAKING CARE OF THIS PATIENT: 48 minutes.    Damia Bobrowski M.D on 02/22/2017 at 5:28 PM  Between 7am to 6pm - Pager - [phone]  After 6pm go to [email or web address] - password [email or web address] [facility]  Office  [phone]  CC: Primary care physician;  Chesley Noon, MD

## 2017-02-07 NOTE — Telephone Encounter (Signed)
Per VO Dr Rogue Bussing patient to go to ER for elevated glucose. Patient and family informed and I contacted orderly who will come and transport him to ER

## 2017-02-08 DIAGNOSIS — L8990 Pressure ulcer of unspecified site, unspecified stage: Secondary | ICD-10-CM | POA: Insufficient documentation

## 2017-02-08 LAB — HEMOGLOBIN A1C
HEMOGLOBIN A1C: 8.6 % — AB (ref 4.8–5.6)
MEAN PLASMA GLUCOSE: 200.12 mg/dL

## 2017-02-08 LAB — BASIC METABOLIC PANEL
ANION GAP: 5 (ref 5–15)
BUN: 80 mg/dL — ABNORMAL HIGH (ref 6–20)
CO2: 23 mmol/L (ref 22–32)
Calcium: 8 mg/dL — ABNORMAL LOW (ref 8.9–10.3)
Chloride: 125 mmol/L — ABNORMAL HIGH (ref 101–111)
Creatinine, Ser: 1.63 mg/dL — ABNORMAL HIGH (ref 0.61–1.24)
GFR calc Af Amer: 43 mL/min — ABNORMAL LOW (ref >60)
GFR, EST NON AFRICAN AMERICAN: 37 mL/min — AB (ref >60)
GLUCOSE: 263 mg/dL — AB (ref 65–99)
Potassium: 4.5 mmol/L (ref 3.5–5.1)
Sodium: 153 mmol/L — ABNORMAL HIGH (ref 135–145)

## 2017-02-08 LAB — TYPE AND SCREEN
ABO/RH(D): A POS
Antibody Screen: NEGATIVE
UNIT DIVISION: 0

## 2017-02-08 LAB — GASTROINTESTINAL PANEL BY PCR, STOOL (REPLACES STOOL CULTURE)
Adenovirus F40/41: NOT DETECTED
Astrovirus: NOT DETECTED
CAMPYLOBACTER SPECIES: NOT DETECTED
CRYPTOSPORIDIUM: NOT DETECTED
Cyclospora cayetanensis: NOT DETECTED
Entamoeba histolytica: NOT DETECTED
Enteroaggregative E coli (EAEC): NOT DETECTED
Enteropathogenic E coli (EPEC): NOT DETECTED
Enterotoxigenic E coli (ETEC): NOT DETECTED
Giardia lamblia: NOT DETECTED
Norovirus GI/GII: NOT DETECTED
PLESIMONAS SHIGELLOIDES: NOT DETECTED
ROTAVIRUS A: NOT DETECTED
SALMONELLA SPECIES: NOT DETECTED
SHIGA LIKE TOXIN PRODUCING E COLI (STEC): NOT DETECTED
Sapovirus (I, II, IV, and V): NOT DETECTED
Shigella/Enteroinvasive E coli (EIEC): NOT DETECTED
Vibrio cholerae: NOT DETECTED
Vibrio species: NOT DETECTED
YERSINIA ENTEROCOLITICA: NOT DETECTED

## 2017-02-08 LAB — GLUCOSE, CAPILLARY
GLUCOSE-CAPILLARY: 110 mg/dL — AB (ref 65–99)
GLUCOSE-CAPILLARY: 168 mg/dL — AB (ref 65–99)
Glucose-Capillary: 207 mg/dL — ABNORMAL HIGH (ref 65–99)
Glucose-Capillary: 247 mg/dL — ABNORMAL HIGH (ref 65–99)

## 2017-02-08 LAB — C DIFFICILE QUICK SCREEN W PCR REFLEX
C DIFFICILE (CDIFF) INTERP: NOT DETECTED
C DIFFICILE (CDIFF) TOXIN: NEGATIVE
C Diff antigen: NEGATIVE

## 2017-02-08 LAB — CBC
HCT: 24.8 % — ABNORMAL LOW (ref 40.0–52.0)
HEMOGLOBIN: 7.9 g/dL — AB (ref 13.0–18.0)
MCH: 29.6 pg (ref 26.0–34.0)
MCHC: 31.9 g/dL — AB (ref 32.0–36.0)
MCV: 92.8 fL (ref 80.0–100.0)
Platelets: 250 10*3/uL (ref 150–440)
RBC: 2.67 MIL/uL — ABNORMAL LOW (ref 4.40–5.90)
RDW: 17.9 % — ABNORMAL HIGH (ref 11.5–14.5)
WBC: 6.2 10*3/uL (ref 3.8–10.6)

## 2017-02-08 LAB — BPAM RBC
BLOOD PRODUCT EXPIRATION DATE: 201809132359
ISSUE DATE / TIME: 201809072051
UNIT TYPE AND RH: 9500

## 2017-02-08 MED ORDER — APIXABAN 5 MG PO TABS
5.0000 mg | ORAL_TABLET | Freq: Two times a day (BID) | ORAL | Status: DC
  Administered 2017-02-08: 5 mg

## 2017-02-08 MED ORDER — DIPHENOXYLATE-ATROPINE 2.5-0.025 MG PO TABS
2.0000 | ORAL_TABLET | Freq: Four times a day (QID) | ORAL | Status: DC
  Administered 2017-02-08 – 2017-02-09 (×5): 2
  Filled 2017-02-08 (×5): qty 2

## 2017-02-08 MED ORDER — DIPHENOXYLATE-ATROPINE 2.5-0.025 MG PO TABS: 1.0000 | ORAL_TABLET | Freq: Four times a day (QID) | ORAL | Status: DC | PRN

## 2017-02-08 MED ORDER — SODIUM CHLORIDE 0.9% FLUSH
3.0000 mL | INTRAVENOUS | Status: DC | PRN
Start: 2017-02-08 — End: 2017-02-10
  Administered 2017-02-09: 3 mL via INTRAVENOUS
  Filled 2017-02-08: qty 3

## 2017-02-08 MED ORDER — VITAMIN D (ERGOCALCIFEROL) 1.25 MG (50000 UNIT) PO CAPS
50000.0000 [IU] | ORAL_CAPSULE | ORAL | Status: DC
  Filled 2017-02-08: qty 1

## 2017-02-08 MED ORDER — LIVING WELL WITH DIABETES BOOK
Freq: Once | Status: AC
  Administered 2017-02-08: 1
  Filled 2017-02-08: qty 1

## 2017-02-08 MED ORDER — SODIUM CHLORIDE 0.9% FLUSH: 10.0000 mL | INTRAVENOUS | Status: DC | PRN

## 2017-02-08 MED ORDER — VITAMIN B-12 1000 MCG PO TABS
500.0000 ug | ORAL_TABLET | Freq: Every day | ORAL | Status: DC
  Administered 2017-02-08 – 2017-02-09 (×2): 500 ug
  Filled 2017-02-08: qty 1

## 2017-02-08 MED ORDER — INSULIN STARTER KIT- PEN NEEDLES (ENGLISH)
1.0000 | Freq: Once | Status: AC
  Administered 2017-02-08: 18:00:00 1
  Filled 2017-02-08: qty 1

## 2017-02-08 MED ORDER — CHOLESTYRAMINE 4 G PO PACK
4.0000 g | PACK | Freq: Four times a day (QID) | ORAL | Status: DC
  Administered 2017-02-08 – 2017-02-09 (×6): 4 g via ORAL
  Filled 2017-02-08 (×10): qty 1

## 2017-02-08 MED ORDER — FINASTERIDE 5 MG PO TABS
5.0000 mg | ORAL_TABLET | Freq: Every day | ORAL | Status: DC
  Administered 2017-02-08 – 2017-02-09 (×2): 5 mg via ORAL
  Filled 2017-02-08: qty 1

## 2017-02-08 MED ORDER — SODIUM CHLORIDE 0.9% FLUSH
10.0000 mL | INTRAVENOUS | Status: AC | PRN
  Administered 2017-02-08: 10 mL

## 2017-02-08 MED ORDER — METOPROLOL SUCCINATE ER 25 MG PO TB24
12.5000 mg | ORAL_TABLET | Freq: Every day | ORAL | Status: DC
  Administered 2017-02-08 – 2017-02-09 (×2): 12.5 mg via ORAL
  Filled 2017-02-08: qty 1

## 2017-02-08 MED ORDER — ATORVASTATIN CALCIUM 20 MG PO TABS
20.0000 mg | ORAL_TABLET | Freq: Every day | ORAL | Status: DC
  Administered 2017-02-08 – 2017-02-09 (×2): 20 mg
  Filled 2017-02-08: qty 1

## 2017-02-08 MED ORDER — FREE WATER
200.0000 mL | Freq: Four times a day (QID) | Status: DC
  Administered 2017-02-08 – 2017-02-09 (×6): 200 mL

## 2017-02-08 MED ORDER — TERAZOSIN HCL 5 MG PO CAPS
10.0000 mg | ORAL_CAPSULE | Freq: Every day | ORAL | Status: DC
  Administered 2017-02-08: 10 mg
  Filled 2017-02-08 (×2): qty 2

## 2017-02-08 MED ORDER — AMLODIPINE BESYLATE 5 MG PO TABS
2.5000 mg | ORAL_TABLET | Freq: Every day | ORAL | Status: DC
  Administered 2017-02-08 – 2017-02-09 (×2): 2.5 mg
  Filled 2017-02-08: qty 1

## 2017-02-08 MED ORDER — PRO-STAT SUGAR FREE PO LIQD
30.0000 mL | Freq: Every day | ORAL | Status: DC
  Administered 2017-02-08 – 2017-02-09 (×2): 30 mL

## 2017-02-08 MED ORDER — HEPARIN SOD (PORK) LOCK FLUSH 100 UNIT/ML IV SOLN: 500.0000 [IU] | INTRAVENOUS | Status: DC | PRN

## 2017-02-08 MED ORDER — SODIUM CHLORIDE 0.9% FLUSH
3.0000 mL | Freq: Two times a day (BID) | INTRAVENOUS | Status: DC
  Administered 2017-02-08 – 2017-02-09 (×4): 3 mL via INTRAVENOUS

## 2017-02-08 MED ORDER — ACETAMINOPHEN 650 MG RE SUPP: 650.0000 mg | Freq: Four times a day (QID) | RECTAL | Status: DC | PRN

## 2017-02-08 MED ORDER — JEVITY 1.5 CAL/FIBER PO LIQD
1000.0000 mL | ORAL | Status: DC
  Administered 2017-02-08 – 2017-02-09 (×2): 1000 mL

## 2017-02-08 MED ORDER — ACETAMINOPHEN 325 MG PO TABS: 650.0000 mg | ORAL_TABLET | Freq: Four times a day (QID) | ORAL | Status: DC | PRN

## 2017-02-08 NOTE — Progress Notes (Signed)
Brief Nutrition Note  Consult received for enteral/tube feeding initiation and management.  Patient is [facility] outpatient [facility]   Per the last f/u note on 8/27, Marcus [name] was on:  Osmolite 1.5, 3 cans and Jevity 1.5, 3 cans per day (higher in soluble fiber to help thicken stool) and promod 5ml daily.  Current tube feeding regimen provides 2233 calories, 100 g of protein and 1015ml free water (formula alone) + 93ml additional free water.    He is under hospice.. Marcus [name] was admitted for hyperglycemia and worsening diarrhea. -> Change TF to continuous and switch to only Jevity 1.5. TO provide same kcals, infuse at rate of  This should be help control BG and reduce diarrhea if it is, in fact, related to the tube feeds. If diarrhea is of infectious etiology, formula should be changed to fiber free. Add prostat q 24 hrs.   Adult Enteral Nutrition Protocol initiated. RD to follow up, assess tube feed tolerance and perform Full assessment to follow.  Admitting Dx: Hyperkalemia [E87.5] SOB (shortness of breath) [R06.02] Hyperglycemia [R73.9] Cellulitis of left lower extremity [L03.116]  Body mass index is 21.44 kg/m. Marcus [name] meets criteria for healthy wt for ht based on current BMI.  Labs:  Recent Labs Lab 02/05/2017 1144 02/18/2017 1430 02/02/2017 2013 02/08/17 0046  NA 148* 149*  --  153*  K 5.3* 5.7* 4.7 4.5  CL 116* 120*  --  125*  CO2 18* 22  --  23  BUN 94* 91*  --  80*  CREATININE 1.87* 1.77*  --  1.63*  CALCIUM 8.5* 8.1*  --  8.0*  MG 2.4  --   --   --   GLUCOSE 556* 493*  --  263*   Burtis Junes RD, LDN, CNSC Clinical Nutrition Pager: [phone] 02/08/2017 7:42 AM

## 2017-02-08 NOTE — Plan of Care (Signed)
Problem: Safety: Goal: Ability to remain free from injury will improve Outcome: Progressing High Fall risk, pt has on yellow arm band and yellow socks. Bed alarm on. Wife currently at bedside.   Problem: Physical Regulation: Goal: Ability to maintain clinical measurements within normal limits will improve Outcome: Progressing Hgb improved to 7.9  Problem: Nutrition: Goal: Adequate nutrition will be maintained Outcome: Not Progressing Pt is NPO with PEG tube in place, no tube feeds ordered currently.

## 2017-02-08 NOTE — Progress Notes (Signed)
[place] [facility]                                                                                                                                                                                  Patient Demographics   Marcus [name], is a 83 y.o. male, DOB - 1931/10/22, AVW:[id number]  Admit date - 02/03/2017   Admitting Physician Bettey Costa, MD  Outpatient Primary MD for the patient is Chesley Noon, MD   LOS - 1  Subjective:  Patient with metastatic esophageal cancer admitted with hyperglycemia hypernatremia Wife at the bedside states that patient sleeps most of the day. He is receiving tube feeds.   Review of Systems:   CONSTITUTIONAL: drowsy  Vitals:   Vitals:   02/28/2017 2344 02/08/17 0549 02/08/17 0901 02/08/17 0902  BP: (!) 114/53 (!) 119/44 (!) 130/50   Pulse: 68 75 76 75  Resp: (!) 24 (!) 22 (!) 22   Temp: 98.2 F (36.8 C) 98.4 F (36.9 C)    TempSrc: Oral Oral    SpO2: 96%  91% 93%  Weight:      Height:        Wt Readings from Last 3 Encounters:  02/25/2017 167 lb (75.8 kg)  01/22/17 174 lb 12.8 oz (79.3 kg)  01/06/17 172 lb (78 kg)     Intake/Output Summary (Last 24 hours) at 02/08/17 1211 Last data filed at 02/08/17 5374  Gross per 24 hour  Intake              974 ml  Output              550 ml  Net              424 ml    Physical Exam:   GENERAL: chronically ill-appearing HEAD, EYES, EARS, NOSE AND THROAT: Atraumatic, normocephalic.Pupils equal and reactive to light. Sclerae anicteric. No conjunctival injection. No oro-pharyngeal erythema.  NECK: Supple. There is no jugular venous distention. No bruits, no lymphadenopathy, no thyromegaly.  HEART: Regular rate and rhythm,. No murmurs, no rubs, no clicks.  LUNGS: Clear to auscultation bilaterally. No rales or rhonchi. No wheezes.  ABDOMEN: Soft, flat, nontender, nondistended. Has good bowel sounds. No hepatosplenomegaly appreciated.  EXTREMITIES: No evidence of  any cyanosis, clubbing, or peripheral edema.  +2 pedal and radial pulses bilaterally.  NEUROLOGIC: The patient is drowsy SKIN: Moist and warm with no rashes appreciated.  Psych: Not anxious, depressed LN: No inguinal LN enlargement    Antibiotics   Anti-infectives    Start     Dose/Rate Route Frequency Ordered Stop   03/01/2017 1715  vancomycin (VANCOCIN) IVPB  1000 mg/200 mL premix     1,000 mg 200 mL/hr over 60 Minutes Intravenous  Once 02/05/2017 1700     02/19/2017 1715  cephALEXin (KEFLEX) capsule 500 mg  Status:  Discontinued     500 mg Oral Every 8 hours 02/20/2017 1707 02/19/2017 1728      Medications   Scheduled Meds: . amLODipine  2.5 mg Per Tube Daily  . apixaban  5 mg Per Tube BID  . [START ON 02/09/2017] atorvastatin  20 mg Per Tube Q breakfast  . cholestyramine  4 g Oral QID  . diphenoxylate-atropine  2 tablet Per Tube QID  . dorzolamide  1 drop Both Eyes BID  . feeding supplement (PRO-STAT SUGAR FREE 64)  30 mL Per Tube Daily  . finasteride  5 mg Oral Daily  . free water  200 mL Per Tube QID  . insulin aspart  0-20 Units Subcutaneous TID WC  . insulin glargine  10 Units Subcutaneous QHS  . insulin starter kit- pen needles  1 kit Other Once  . latanoprost  1 drop Both Eyes QHS  . metoprolol succinate  12.5 mg Oral Daily  . sodium chloride flush  3 mL Intravenous Q12H  . terazosin  10 mg Per Tube QHS  . timolol  1 drop Both Eyes BID  . vitamin B-12  500 mcg Per Tube Daily  . [START ON 03-08-17] Vitamin D (Ergocalciferol)  50,000 Units Per Tube Q7 days   Continuous Infusions: . feeding supplement (JEVITY 1.5 CAL/FIBER) 1,000 mL (02/08/17 0920)  . vancomycin     PRN Meds:.acetaminophen **OR** acetaminophen, diphenoxylate-atropine, ondansetron **OR** ondansetron (ZOFRAN) IV, senna-docusate, sodium chloride flush   Data Review:   Micro Results Recent Results (from the past 240 hour(s))  Blood culture (routine x 2)     Status: None (Preliminary result)   Collection  Time: 02/17/2017  5:16 PM  Result Value Ref Range Status   Specimen Description BLOOD BLOOD RIGHT ARM  Final   Special Requests   Final    BOTTLES DRAWN AEROBIC AND ANAEROBIC Blood Culture adequate volume   Culture NO GROWTH < 12 HOURS  Final   Report Status PENDING  Incomplete  Blood culture (routine x 2)     Status: None (Preliminary result)   Collection Time: 02/27/2017  5:16 PM  Result Value Ref Range Status   Specimen Description BLOOD BLOOD LEFT ARM  Final   Special Requests   Final    BOTTLES DRAWN AEROBIC AND ANAEROBIC Blood Culture adequate volume   Culture NO GROWTH < 12 HOURS  Final   Report Status PENDING  Incomplete  Gastrointestinal Panel by PCR , Stool     Status: None   Collection Time: 02/12/2017 11:59 PM  Result Value Ref Range Status   Campylobacter species NOT DETECTED NOT DETECTED Final   Plesimonas shigelloides NOT DETECTED NOT DETECTED Final   Salmonella species NOT DETECTED NOT DETECTED Final   Yersinia enterocolitica NOT DETECTED NOT DETECTED Final   Vibrio species NOT DETECTED NOT DETECTED Final   Vibrio cholerae NOT DETECTED NOT DETECTED Final   Enteroaggregative E coli (EAEC) NOT DETECTED NOT DETECTED Final   Enteropathogenic E coli (EPEC) NOT DETECTED NOT DETECTED Final   Enterotoxigenic E coli (ETEC) NOT DETECTED NOT DETECTED Final   Shiga like toxin producing E coli (STEC) NOT DETECTED NOT DETECTED Final   Shigella/Enteroinvasive E coli (EIEC) NOT DETECTED NOT DETECTED Final   Cryptosporidium NOT DETECTED NOT DETECTED Final   Cyclospora cayetanensis NOT  DETECTED NOT DETECTED Final   Entamoeba histolytica NOT DETECTED NOT DETECTED Final   Giardia lamblia NOT DETECTED NOT DETECTED Final   Adenovirus F40/41 NOT DETECTED NOT DETECTED Final   Astrovirus NOT DETECTED NOT DETECTED Final   Norovirus GI/GII NOT DETECTED NOT DETECTED Final   Rotavirus A NOT DETECTED NOT DETECTED Final   Sapovirus (I, II, IV, and V) NOT DETECTED NOT DETECTED Final  C difficile  quick scan w PCR reflex     Status: None   Collection Time: 02/06/2017 11:59 PM  Result Value Ref Range Status   C Diff antigen NEGATIVE NEGATIVE Final   C Diff toxin NEGATIVE NEGATIVE Final   C Diff interpretation No C. difficile detected.  Final    Comment: VALID    Radiology Reports Dg Chest 1 View  Result Date: 02/11/2017 CLINICAL DATA:  Hyperglycemia.  Esophageal cancer. EXAM: CHEST 1 VIEW COMPARISON:  12/20/2016. FINDINGS: Interval borderline enlarged cardiac silhouette. Stable diffusely prominent interstitial markings and right jugular catheter. Small amount of linear density of both lung bases. Minimal right pleural effusion. Diffuse osteopenia. IMPRESSION: 1. Interval borderline cardiomegaly. 2. Minimal right pleural effusion, without significant change. 3. Stable chronic interstitial lung disease. 4. Minimal bibasilar linear atelectasis. Electronically Signed   By: Claudie Revering M.D.   On: 02/02/2017 18:01   US Venous Img Lower Unilateral Left  Result Date: 02/06/2017 CLINICAL DATA:  Left lower extremity pain and edema EXAM: Left LOWER EXTREMITY VENOUS DOPPLER ULTRASOUND TECHNIQUE: Gray-scale sonography with graded compression, as well as color Doppler and duplex ultrasound were performed to evaluate the lower extremity deep venous systems from the level of the common femoral vein and including the common femoral, femoral, profunda femoral, popliteal and calf veins including the posterior tibial, peroneal and gastrocnemius veins when visible. The superficial great saphenous vein was also interrogated. Spectral Doppler was utilized to evaluate flow at rest and with distal augmentation maneuvers in the common femoral, femoral and popliteal veins. COMPARISON:  None. FINDINGS: Contralateral Common Femoral Vein: Respiratory phasicity is normal and symmetric with the symptomatic side. No evidence of thrombus. Normal compressibility. Common Femoral Vein: No evidence of thrombus. Normal compressibility,  respiratory phasicity and response to augmentation. Saphenofemoral Junction: No evidence of thrombus. Normal compressibility and flow on color Doppler imaging. Profunda Femoral Vein: No evidence of thrombus. Normal compressibility and flow on color Doppler imaging. Femoral Vein: No evidence of thrombus. Normal compressibility, respiratory phasicity and response to augmentation. Popliteal Vein: No evidence of thrombus. Normal compressibility, respiratory phasicity and response to augmentation. Calf Veins: No evidence of thrombus. Normal compressibility and flow on color Doppler imaging. Other Findings:  None. IMPRESSION: No evidence of DVT within the left lower extremity. Electronically Signed   By: Donavan Foil M.D.   On: 02/14/2017 15:37     CBC  Recent Labs Lab 02/19/2017 1144 02/08/17 0046  WBC 6.3 6.2  HGB 7.1* 7.9*  HCT 23.6* 24.8*  PLT 265 250  MCV 94.3 92.8  MCH 28.5 29.6  MCHC 30.2* 31.9*  RDW 20.0* 17.9*  LYMPHSABS 0.6*  --   MONOABS 0.4  --   EOSABS 0.1  --   BASOSABS 0.0  --     Chemistries   Recent Labs Lab 02/01/2017 1144 02/19/2017 1430 02/06/2017 2013 02/08/17 0046  NA 148* 149*  --  153*  K 5.3* 5.7* 4.7 4.5  CL 116* 120*  --  125*  CO2 18* 22  --  23  GLUCOSE 556* 493*  --  263*  BUN 94* 91*  --  80*  CREATININE 1.87* 1.77*  --  1.63*  CALCIUM 8.5* 8.1*  --  8.0*  MG 2.4  --   --   --   AST  --  39  --   --   ALT  --  28  --   --   ALKPHOS  --  382*  --   --   BILITOT  --  0.4  --   --    ------------------------------------------------------------------------------------------------------------------ estimated creatinine clearance is 36.8 mL/min (A) (by C-G formula based on SCr of 1.63 mg/dL (H)). ------------------------------------------------------------------------------------------------------------------  Recent Labs  02/06/2017 2013  HGBA1C 8.6*    ------------------------------------------------------------------------------------------------------------------ No results for input(s): CHOL, HDL, LDLCALC, TRIG, CHOLHDL, LDLDIRECT in the last 72 hours. ------------------------------------------------------------------------------------------------------------------ No results for input(s): TSH, T4TOTAL, T3FREE, THYROIDAB in the last 72 hours.  Invalid input(s): FREET3 ------------------------------------------------------------------------------------------------------------------ No results for input(s): VITAMINB12, FOLATE, FERRITIN, TIBC, IRON, RETICCTPCT in the last 72 hours.  Coagulation profile No results for input(s): INR, PROTIME in the last 168 hours.  No results for input(s): DDIMER in the last 72 hours.  Cardiac Enzymes No results for input(s): CKMB, TROPONINI, MYOGLOBIN in the last 168 hours.  Invalid input(s): CK ------------------------------------------------------------------------------------------------------------------ Invalid input(s): POCBNP    Assessment & Plan   83 year old male with metastatic esophageal cancer status post PEG tube who presents with abnormal labs showing hyperglycemia.  1. Hyperglycemia without history of diabetes Suspect this is related to patient being on tube feeds Hemoglobin A1c greater than 8 continue 10 units at bedtime Sliding scale insulin and started Diabetes coordinator consultation requested  2. Hyperkalemia: This has been treated with insulin and IV fluids Resolved with therapy  3. Acute on chronic kidney disease stage III: Stable continue to monitor  4. Metastatic esophageal cancer with hospice and PEG feedings Prognosis very poor family understands that his things are getting worst in his disease is progressing Appreciate oncology input  5. acute on chronic anemia:  This is related to patient's esophageal cancer, as well as bone marrow suppression,  chronic GI blood loss Patient also on eliquis which is currently being held   6. Lower extremity edema: Dopplers negative for DVT Last echocardiogram shows normal ejection fraction however does mention diastolic dysfunction This is related to severe hypoalbuminemia  7. History of PE/DVT currently on anticoagulation will be held for today, I discussed with the family regarding drop in hemoglobin and possible blood loss from his metastatic esophageal cancer they would like to continue anticoagulation  8. Atrial fibrillation : Continue  metoprolol eiliquis on hold  9. Chronic severe diarrhea C. Difficile negative, suspect related to tube feeds nutritionist to evaluate to see if another tube feed is appropriate for this patient I will start him on antidiarrheals as well as Questran to see if that helps  10. History of CVA: Continue anticoagulation and Lipitor  11. Essential hypertension: Continue metoprolol  12. BPH: Continue finasteride and Hytrin     Code Status Orders        Start     Ordered   02/23/2017 1942  Do not attempt resuscitation (DNR)  Continuous    Question Answer Comment  In the event of cardiac or respiratory ARREST Do not call a "code blue"   In the event of cardiac or respiratory ARREST Do not perform Intubation, CPR, defibrillation or ACLS   In the event of cardiac or respiratory ARREST Use medication by any route, position, wound care, and other measures to relive pain  and suffering. May use oxygen, suction and manual treatment of airway obstruction as needed for comfort.      02/21/2017 1941    Code Status History    Date Active Date Inactive Code Status Order ID Comments User Context   02/15/2017  5:49 PM 03/02/2017  7:41 PM Full Code [id number]  Bettey Costa, MD ED   02/08/2017  5:38 PM 02/08/2017  5:49 PM DNR [id number]  Bettey Costa, MD ED   12/10/2016  3:11 PM 12/12/2016  9:04 PM Full Code [id number]  Gladstone Lighter, MD Inpatient   02/23/2016 10:36 PM 02/26/2016   3:22 PM Full Code [id number]  Toy Baker, MD Inpatient   12/24/2014  6:24 PM 12/26/2014  7:01 PM Full Code [id number]  Delfina Redwood, MD Inpatient           Consults  oncology  DVT Prophylaxis  scd's  Lab Results  Component Value Date   PLT 250 02/08/2017     Time Spent in minutes   87mn Greater than 50% of time spent in care coordination and counseling patient regarding the condition and plan of care.   PDustin FlockM.D on 02/08/2017 at 12:11 PM  Between 7am to 6pm - Pager - [phone]  After 6pm go to [email or web address] - password [email or web address]   Office  [phone]

## 2017-02-08 NOTE — Clinical Social Work Note (Signed)
CSW received consult for SNF placement. CSW will follow pending PT recommendations.  Karrington Studnicka Martha Sherlynn Tourville, MSW, LCSWA [phone] 

## 2017-02-08 NOTE — Progress Notes (Signed)
Inpatient Diabetes Program Recommendations  AACE/ADA: New Consensus Statement on Inpatient Glycemic Control (2015)  Target Ranges:  Prepandial:   less than 140 mg/dL      Peak postprandial:   less than 180 mg/dL (1-2 hours)      Critically ill patients:  140 - 180 mg/dL  Results for Marcus, [name] (MRN [id number]) as of 02/08/2017 08:14  Ref. Range 02/20/2017 16:01 02/12/2017 17:06 02/05/2017 20:16 02/25/2017 21:07 02/14/2017 22:11 02/08/2017 07:41  Glucose-Capillary Latest Ref Range: 65 - 99 mg/dL 452 (H) 274 (H) 297 (H) 285 (H) 284 (H) 207 (H)   Results for Marcus, [name] (MRN [id number]) as of 02/08/2017 08:14  Ref. Range 02/11/2017 20:13  Hemoglobin A1C Latest Ref Range: 4.8 - 5.6 % 8.6 (H)   Results for Marcus, [name] (MRN [id number]) as of 02/08/2017 08:14  Ref. Range 02/19/2017 11:44  Hemoglobin Latest Ref Range: 13.0 - 18.0 g/dL 7.1 (L)   Review of Glycemic Control  Diabetes history: No Outpatient Diabetes medications: NA Current orders for Inpatient glycemic control: Lantus 10 units QHS, Novolog 0-20 units TID with meals  Inpatient Diabetes Program Recommendations: Insulin-Basal: Please consider increasing Lantus to 12 units QHS. Insulin-Correction: Please consider decreasing Novolog correction scale to Moderate (0-15 units) and change frequency to Q4H since patient is NPO and receiving tube feedings. HgbA1C: A1C 8.6% on 02/17/2017. Per ADA if A1C is 6.5% or greater then criteria met to dx with DM. However, noted patient's hemoglobin was 7.1 g/dL on 02/24/2017 when A1C ran. Therefore, anticipate A1C is not completely accurate.   NOTE: Diabetes is available by pager for questions/concerns over the weekend and is not physically on campus. Noted consult for hyperglycemia. In reviewing chart, noted patient has metastatic esophageal cancer, hemoglobin level 7.1 g/dL on 02/28/2017 when A1C ran, and patient started tube feedings about 2 months ago. Also noted, glucose was not elevated in July and patient  is under hospice care. Question if patient has received any steroids over the past couple of months which could also be contributing to hyperglycemia. Anticipate tube feeding is main reason for hyperglycemia. Ordered Living Well with Diabetes booklet, insulin starter kit, and patient education by bedside RNs. MD, please indicate in progress note if patient will be dx with DM at this time and what plan will be for glycemic control (will patient be discharged on insulin?).  Please inform bedside RNs of patient education needs so patient and family can be educated by bedside RNs.  Thanks, Marcus Alderman, RN, MSN, CDE Diabetes Coordinator Inpatient Diabetes Program [phone] (Team Pager from 8am to 5pm)

## 2017-02-08 NOTE — Progress Notes (Addendum)
Pt reports no pain; sleeping at long intervals. Generalized weakness. Gtube feedings restarted and tolerating well. No diarrhea thus far this shift. Pt desires to go home; wife verbalized concern over being able to care for him at home due to increased care needs. Advised wife of hospice support who is following them; discuss with pt and her family to increase home support/ DME availability as needed. Son and dgt. In . Began diabetic education with booklet given. Stands with 1+ to use urinal bedside with pt weak/SOB on exertion which resolves with rest. TC from [facility] with Hospice with update given and states they will continue to follow pt. NO insulin starter kit available.

## 2017-02-08 NOTE — Progress Notes (Addendum)
PT Cancellation Note  Patient Details Name: Marcus [name] MRN: [id number] DOB: [birth year]   Cancelled Treatment:    Reason Eval/Treat Not Completed: PT screened, no needs identified, will sign off. Order received and chart reviewed. Spoke with patient's wife and son who reported that their plan is to take patient home at discharge. He is currently being followed by Hospice and they see no need for physical therapy intervention. They are not interested in suspending Hospice services for SNF placement. Wife reports that they are concerned about DME needs but are aware that Hospice will assist with all equipment. Family agrees to cancel order. No further needs identified and PT order will be completed.   Lyndel Safe Huprich PT, DPT   Huprich,[name] 02/08/2017, 3:28 PM

## 2017-02-09 ENCOUNTER — Inpatient Hospital Stay

## 2017-02-09 LAB — GLUCOSE, CAPILLARY
GLUCOSE-CAPILLARY: 213 mg/dL — AB (ref 65–99)
GLUCOSE-CAPILLARY: 73 mg/dL (ref 65–99)
GLUCOSE-CAPILLARY: 163 mg/dL — AB (ref 65–99)
GLUCOSE-CAPILLARY: 69 mg/dL (ref 65–99)
Glucose-Capillary: 172 mg/dL — ABNORMAL HIGH (ref 65–99)
Glucose-Capillary: 260 mg/dL — ABNORMAL HIGH (ref 65–99)
Glucose-Capillary: 229 mg/dL — ABNORMAL HIGH (ref 65–99)
Glucose-Capillary: 100 mg/dL — ABNORMAL HIGH (ref 65–99)

## 2017-02-09 LAB — BASIC METABOLIC PANEL
Anion gap: 3 — ABNORMAL LOW (ref 5–15)
BUN: 64 mg/dL — AB (ref 6–20)
CHLORIDE: 127 mmol/L — AB (ref 101–111)
CO2: 24 mmol/L (ref 22–32)
CREATININE: 1.43 mg/dL — AB (ref 0.61–1.24)
Calcium: 7.8 mg/dL — ABNORMAL LOW (ref 8.9–10.3)
GFR calc Af Amer: 51 mL/min — ABNORMAL LOW (ref >60)
GFR calc non Af Amer: 44 mL/min — ABNORMAL LOW (ref >60)
Glucose, Bld: 310 mg/dL — ABNORMAL HIGH (ref 65–99)
Potassium: 4.7 mmol/L (ref 3.5–5.1)
SODIUM: 154 mmol/L — AB (ref 135–145)

## 2017-02-09 MED ORDER — DEXTROSE 50 % IV SOLN
INTRAVENOUS | Status: AC
  Administered 2017-02-09: 50 mL via INTRAVENOUS
  Filled 2017-02-09: qty 50

## 2017-02-09 MED ORDER — DEXTROSE 5 % IV SOLN
INTRAVENOUS | Status: DC
  Administered 2017-02-09: 13:00:00 via INTRAVENOUS

## 2017-02-09 MED ORDER — SODIUM CHLORIDE 0.9 % IV SOLN
3.0000 g | Freq: Four times a day (QID) | INTRAVENOUS | Status: DC
  Administered 2017-02-09 (×2): 3 g via INTRAVENOUS
  Filled 2017-02-09 (×5): qty 3

## 2017-02-09 MED ORDER — LEVOFLOXACIN 500 MG PO TABS: 500.0000 mg | ORAL_TABLET | Freq: Every day | ORAL | Status: DC

## 2017-02-09 MED ORDER — MORPHINE SULFATE (PF) 2 MG/ML IV SOLN
2.0000 mg | INTRAVENOUS | Status: DC | PRN
  Administered 2017-02-09: 2 mg via INTRAVENOUS
  Filled 2017-02-09: qty 1

## 2017-02-09 MED ORDER — DEXTROSE 50 % IV SOLN
1.0000 | Freq: Once | INTRAVENOUS | Status: AC
  Administered 2017-02-09: 50 mL via INTRAVENOUS

## 2017-02-09 NOTE — Progress Notes (Signed)
Placed patient on HFNC due to WOB, Tachypnea, and decreased saturations. On 55L and 75%, sats came up to 93%, RR still in upper 30's. Dr. Posey Pronto notified of change.

## 2017-02-09 NOTE — Plan of Care (Signed)
Problem: Physical Regulation: Goal: Ability to maintain clinical measurements within normal limits will improve Outcome: Not Progressing Tachyneic, labored respiration, lung congestion increased with productive cough with chest xray indicating pneumonia per MD with wife aware. Unable to maintain pulse ox levels with respiratory consulted with high flow 02 initiated. MD has spoken with wife. IVF's started, unasyn IV started. Tube feedings on hold due to concern of tolerance. Goal: Will remain free from infection Outcome: Not Progressing Pt has developed pneumonia   Problem: Nutrition: Goal: Adequate nutrition will be maintained Outcome: Not Progressing Tube feedings now placed on hold with pt unable to take po's due to esophageal cancer

## 2017-02-09 NOTE — Care Management Note (Signed)
Case Management Note  Patient Details  Name: Marcus [name] MRN: [id number] Date of Birth: Jul 29, 1931  Subjective/Objective:   Per call from Lorelle Formosa at [place], Marcus [name] is [name] patient of Hospice at his home.                  Action/Plan:   Expected Discharge Date:  02/09/17               Expected Discharge Plan:     In-House Referral:     Discharge planning Services     Post Acute Care Choice:    Choice offered to:     DME Arranged:    DME Agency:     HH Arranged:    HH Agency:     Status of Service:     If discussed at [employer] [facility] [employer], dates discussed:    Additional Comments:  Marcus Adami A, RN 02/09/2017, 11:19 AM

## 2017-02-09 NOTE — Progress Notes (Signed)
Initial Nutrition Assessment  DOCUMENTATION CODES:   Non-severe (moderate) malnutrition in context of chronic illness  INTERVENTION:  Continue continuous tube feed of Jevity 1.5 at 60 ml/hr + Pro-Stat 30 ml once daily via PEG tube. Goal regimen provides 2260 kcal, 107 grams of protein, 1094 ml H2O daily.  Patient currently ordered for 200 ml free water flush Q6hrs. Provides 1894 ml H2O daily including water in tube feeds.  NUTRITION DIAGNOSIS:   Malnutrition (Moderate) related to chronic illness (metastatic esophageal cancer) as evidenced by percent weight loss, mild depletion of body fat, moderate depletion of body fat, mild depletion of muscle mass, moderate depletions of muscle mass.  GOAL:   Patient will meet greater than or equal to 90% of their needs  MONITOR:   Labs, Weight trends, TF tolerance, I & O's  REASON FOR ASSESSMENT:   Consult Enteral/tube feeding initiation and management (and assessment of nutrition requirements/status)  ASSESSMENT:   83 year old male with PMHx of HTN, HLD, hx CVA 04/2015, arthritis, gout, GERD, glaucoma, macular degeneration, hx DVT right arm, hx pulmonary embolus, metastatic adenocarcinoma of esophagus s/p XRT (currently followed by hospice), dysphagia s/p PEG tube placement 12/11/2016 who presented with hyperglycemia without history of DM, found to have PNA.   -Patient is no longer receiving treatments. Followed by hospice. -Per MD note hyperglycemia thought to be related to tube feeds. -MD also initiated patient on Questran and Lomotil.  Patient known to this RD from previous assessment following PEG tube placement. Patient is also being followed by outpatient RD at cancer center. Patient was initially started on Osmolite 1.5 Cal 360 ml QID + ProMod 30 ml daily. Was initiated on 60 ml free water flush before and after tube feeding and 180 ml TID between feedings. Patient later began developing loose stools (6+ per day). On 8/9 patient was  switched to 3 cans of Jevity 1.5 daily + 3 cans of Osmolite 1.5 daily + Promod 30 ml daily to increase fiber from tube feeding.  Spoke with patient's wife at bedside. Patient resting and unable to participate in assessment. Patient's wife reports that PTA she had been providing 3 cans of Osmolite 1.5 and 3 cans of Jevity 1.5 daily. Also provides ProMod 30 ml daily. She has been providing 30 ml of free water flush before and after tube feeds.   Patient's wife believes continuous rate patient is currently on may be best tolerated at home. She would like to go home with continuous rate if pump and supplies can be set up at home. Discussed that with continuous tube feeding, HOB needs to remain >30-45 degrees throughout the day to lessen risk of aspiration. Wife reports hospice is working on setting up a hospital bed at home and she would be able to keep [facility] elevated for patient.  Patient was 197.25 lbs on 09/06/2016. He had lost down to 174.25 lbs on 12/06/2016 (shortly before placement of PEG tube). That is a weight loss of 23 lbs (11.7% body weight) over 3 months, which is significant for time frame. Since placement of PEG tube patient has lost only 5.05 lbs (2.9% body weight) over 2 months, which is not significant for time frame.  Access: 18 Fr. PEG tube placed 12/11/2016 by Dr. Tamala Julian (20 ml water in balloon)  TF: patient tolerating Jevity 1.5 at 60 ml/hr (has received 1332 ml of Jevity 1.5 in past 24 hrs per pump history); currently ordered for 200 ml free water flush Q6hrs  Medications reviewed and include: Questran  4 grams QID, Novolog 0-20 units TID, Lantus 10 units daily, vitamin B12 500 micrograms daily, vitamin D 50000 units every 7 days, D5 @ 75 ml/hr (appears to be 1 L bolus as it is scheduled to d/c on 9/10 at 1229).  Labs reviewed: CBG 100-260 past 24 hrs, Sodium 154, Chloride 127, Bun 64, Creatinine 1.43, Anion gap 3. HgbA1c 8.6 on 9/7 (was 6.3 on 12/25/2014).  Nutrition-Focused physical exam  completed. Findings are mild-moderate fat depletion, mild-moderate muscle depletion, and moderate edema.   Discussed with RN. Patient had two loose bowel movements yesterday. Since then has not had any bowel movements.  Diet Order:  Diet NPO time specified  Skin:  Wound (see comment) (Stg II right buttocks)  Last BM:  02/08/2017 - small type 7  Height:   Ht Readings from Last 1 Encounters:  02/14/2017 6\' 2"  (1.88 m)    Weight:   Wt Readings from Last 1 Encounters:  02/09/17 169 lb 3.2 oz (76.7 kg)    Ideal Body Weight:  86.4 kg  BMI:  Body mass index is 21.72 kg/m.  Estimated Nutritional Needs:   Kcal:  2000-2300 (MSJ x 1.3-1.5)  Protein:  92-115 grams (1.2-1.5 grams/kg)  Fluid:  1.9 L/day (25 ml/kg)  EDUCATION NEEDS:   No education needs identified at this time  Willey Blade, MS, RD, LDN Pager: [phone] After Hours Pager: [phone]

## 2017-02-09 NOTE — Progress Notes (Addendum)
Updated wife and family on pt condition and actions. Emotional support given/active listening. "Gone From My Sight" given to wife/family. Wife declined to assist with insulin injection at suppertime.

## 2017-02-09 NOTE — Progress Notes (Signed)
[place] at [facility]                                                                                                                                                                                  Patient Demographics   Marcus [name], is a 83 y.o. male, DOB - [birth year], EXH:[id number]  Admit date - 03/01/2017   Admitting Physician Bettey Costa, MD  Outpatient Primary MD for the patient is Chesley Noon, MD   LOS - 2  Subjective:  Patient little more awake coughing today   Review of Systems:   CONSTITUTIONAL: has confusion and unable to provide me ros  Vitals:   Vitals:   02/09/17 1258 02/09/17 1307 02/09/17 1315 02/09/17 1350  BP: (!) 125/41     Pulse: 86 83 82   Resp: (!) 42  (!) 48   Temp: 100.1 F (37.8 C)     TempSrc: Oral     SpO2: 90% (!) 89% 90% 93%  Weight:      Height:        Wt Readings from Last 3 Encounters:  02/09/17 169 lb 3.2 oz (76.7 kg)  01/22/17 174 lb 12.8 oz (79.3 kg)  01/06/17 172 lb (78 kg)     Intake/Output Summary (Last 24 hours) at 02/09/17 1403 Last data filed at 02/09/17 1341  Gross per 24 hour  Intake             1105 ml  Output              551 ml  Net              554 ml    Physical Exam:   GENERAL: chronically ill-appearing HEAD, EYES, EARS, NOSE AND THROAT: Atraumatic, normocephalic.Pupils equal and reactive to light. Sclerae anicteric. No conjunctival injection. No oro-pharyngeal erythema.  NECK: Supple. There is no jugular venous distention. No bruits, no lymphadenopathy, no thyromegaly.  HEART: Regular rate and rhythm,. No murmurs, no rubs, no clicks.  LUNGS: rhonchus breath sounds bilaterally in upper lobeNo rales or rhonchi. No wheezes.  ABDOMEN: Soft, flat, nontender, nondistended. Has good bowel sounds. No hepatosplenomegaly appreciated.  EXTREMITIES: No evidence of any cyanosis, clubbing, or peripheral edema.  +2 pedal and radial pulses bilaterally.  NEUROLOGIC: The patient is  drowsy SKIN: Moist and warm with no rashes appreciated.  Psych: Not anxious, depressed LN: No inguinal LN enlargement    Antibiotics   Anti-infectives    Start     Dose/Rate Route Frequency Ordered Stop   02/09/17 1330  Ampicillin-Sulbactam (UNASYN) 3 g in sodium chloride 0.9 % 100 mL IVPB     3 g  200 mL/hr over 30 Minutes Intravenous Every 6 hours 02/09/17 1328     02/09/17 1200  levofloxacin (LEVAQUIN) tablet 500 mg  Status:  Discontinued     500 mg Per Tube Daily 02/09/17 1119 02/09/17 1214   02/09/2017 1715  vancomycin (VANCOCIN) IVPB 1000 mg/200 mL premix     1,000 mg 200 mL/hr over 60 Minutes Intravenous  Once 02/26/2017 1700     02/22/2017 1715  cephALEXin (KEFLEX) capsule 500 mg  Status:  Discontinued     500 mg Oral Every 8 hours 02/28/2017 1707 02/19/2017 1728      Medications   Scheduled Meds: . amLODipine  2.5 mg Per Tube Daily  . atorvastatin  20 mg Per Tube Q breakfast  . cholestyramine  4 g Oral QID  . diphenoxylate-atropine  2 tablet Per Tube QID  . dorzolamide  1 drop Both Eyes BID  . feeding supplement (PRO-STAT SUGAR FREE 64)  30 mL Per Tube Daily  . finasteride  5 mg Oral Daily  . free water  200 mL Per Tube QID  . insulin aspart  0-20 Units Subcutaneous TID WC  . insulin glargine  10 Units Subcutaneous QHS  . latanoprost  1 drop Both Eyes QHS  . metoprolol succinate  12.5 mg Oral Daily  . sodium chloride flush  3 mL Intravenous Q12H  . terazosin  10 mg Per Tube QHS  . timolol  1 drop Both Eyes BID  . vitamin B-12  500 mcg Per Tube Daily  . [START ON 02/15/17] Vitamin D (Ergocalciferol)  50,000 Units Per Tube Q7 days   Continuous Infusions: . ampicillin-sulbactam (UNASYN) IV 3 g (02/09/17 1341)  . dextrose 75 mL/hr at 02/09/17 1253  . feeding supplement (JEVITY 1.5 CAL/FIBER) 1,000 mL (02/09/17 0540)  . vancomycin     PRN Meds:.acetaminophen **OR** acetaminophen, diphenoxylate-atropine, heparin lock flush, ondansetron **OR** ondansetron (ZOFRAN) IV,  senna-docusate, sodium chloride flush, sodium chloride flush, sodium chloride flush   Data Review:   Micro Results Recent Results (from the past 240 hour(s))  Blood culture (routine x 2)     Status: None (Preliminary result)   Collection Time: 02/28/2017  5:16 PM  Result Value Ref Range Status   Specimen Description BLOOD BLOOD RIGHT ARM  Final   Special Requests   Final    BOTTLES DRAWN AEROBIC AND ANAEROBIC Blood Culture adequate volume   Culture NO GROWTH 2 DAYS  Final   Report Status PENDING  Incomplete  Blood culture (routine x 2)     Status: None (Preliminary result)   Collection Time: 02/08/2017  5:16 PM  Result Value Ref Range Status   Specimen Description BLOOD BLOOD LEFT ARM  Final   Special Requests   Final    BOTTLES DRAWN AEROBIC AND ANAEROBIC Blood Culture adequate volume   Culture NO GROWTH 2 DAYS  Final   Report Status PENDING  Incomplete  Gastrointestinal Panel by PCR , Stool     Status: None   Collection Time: 02/09/2017 11:59 PM  Result Value Ref Range Status   Campylobacter species NOT DETECTED NOT DETECTED Final   Plesimonas shigelloides NOT DETECTED NOT DETECTED Final   Salmonella species NOT DETECTED NOT DETECTED Final   Yersinia enterocolitica NOT DETECTED NOT DETECTED Final   Vibrio species NOT DETECTED NOT DETECTED Final   Vibrio cholerae NOT DETECTED NOT DETECTED Final   Enteroaggregative E coli (EAEC) NOT DETECTED NOT DETECTED Final   Enteropathogenic E coli (EPEC) NOT DETECTED NOT DETECTED Final  Enterotoxigenic E coli (ETEC) NOT DETECTED NOT DETECTED Final   Shiga like toxin producing E coli (STEC) NOT DETECTED NOT DETECTED Final   Shigella/Enteroinvasive E coli (EIEC) NOT DETECTED NOT DETECTED Final   Cryptosporidium NOT DETECTED NOT DETECTED Final   Cyclospora cayetanensis NOT DETECTED NOT DETECTED Final   Entamoeba histolytica NOT DETECTED NOT DETECTED Final   Giardia lamblia NOT DETECTED NOT DETECTED Final   Adenovirus F40/41 NOT DETECTED NOT  DETECTED Final   Astrovirus NOT DETECTED NOT DETECTED Final   Norovirus GI/GII NOT DETECTED NOT DETECTED Final   Rotavirus A NOT DETECTED NOT DETECTED Final   Sapovirus (I, II, IV, and V) NOT DETECTED NOT DETECTED Final  C difficile quick scan w PCR reflex     Status: None   Collection Time: 02/28/2017 11:59 PM  Result Value Ref Range Status   C Diff antigen NEGATIVE NEGATIVE Final   C Diff toxin NEGATIVE NEGATIVE Final   C Diff interpretation No C. difficile detected.  Final    Comment: VALID    Radiology Reports Dg Chest 1 View  Result Date: 03/02/2017 CLINICAL DATA:  Hyperglycemia.  Esophageal cancer. EXAM: CHEST 1 VIEW COMPARISON:  12/20/2016. FINDINGS: Interval borderline enlarged cardiac silhouette. Stable diffusely prominent interstitial markings and right jugular catheter. Small amount of linear density of both lung bases. Minimal right pleural effusion. Diffuse osteopenia. IMPRESSION: 1. Interval borderline cardiomegaly. 2. Minimal right pleural effusion, without significant change. 3. Stable chronic interstitial lung disease. 4. Minimal bibasilar linear atelectasis. Electronically Signed   By: Claudie Revering M.D.   On: 03/02/2017 18:01   US Venous Img Lower Unilateral Left  Result Date: 02/18/2017 CLINICAL DATA:  Left lower extremity pain and edema EXAM: Left LOWER EXTREMITY VENOUS DOPPLER ULTRASOUND TECHNIQUE: Gray-scale sonography with graded compression, as well as color Doppler and duplex ultrasound were performed to evaluate the lower extremity deep venous systems from the level of the common femoral vein and including the common femoral, femoral, profunda femoral, popliteal and calf veins including the posterior tibial, peroneal and gastrocnemius veins when visible. The superficial great saphenous vein was also interrogated. Spectral Doppler was utilized to evaluate flow at rest and with distal augmentation maneuvers in the common femoral, femoral and popliteal veins. COMPARISON:   None. FINDINGS: Contralateral Common Femoral Vein: Respiratory phasicity is normal and symmetric with the symptomatic side. No evidence of thrombus. Normal compressibility. Common Femoral Vein: No evidence of thrombus. Normal compressibility, respiratory phasicity and response to augmentation. Saphenofemoral Junction: No evidence of thrombus. Normal compressibility and flow on color Doppler imaging. Profunda Femoral Vein: No evidence of thrombus. Normal compressibility and flow on color Doppler imaging. Femoral Vein: No evidence of thrombus. Normal compressibility, respiratory phasicity and response to augmentation. Popliteal Vein: No evidence of thrombus. Normal compressibility, respiratory phasicity and response to augmentation. Calf Veins: No evidence of thrombus. Normal compressibility and flow on color Doppler imaging. Other Findings:  None. IMPRESSION: No evidence of DVT within the left lower extremity. Electronically Signed   By: Donavan Foil M.D.   On: 02/16/2017 15:37   Dg Chest Port 1 View  Result Date: 02/09/2017 CLINICAL DATA:  Shortness of breath EXAM: PORTABLE CHEST 1 VIEW COMPARISON:  Chest x-rays dated 02/09/2017 and 12/20/2016. FINDINGS: Patchy ill-defined airspace opacities bilaterally, lower lobe predominant, new compared to previous exams suggesting developing pneumonia or less likely asymmetric pulmonary edema. Background of coarse interstitial markings again noted indicating chronic interstitial lung disease. No pleural effusion or pneumothorax seen. Heart size and mediastinal contours are  stable. Atherosclerotic changes noted at the aortic arch. Right chest wall Port-A-Cath is stable in position. No acute or suspicious osseous abnormality. IMPRESSION: 1. New bibasilar airspace opacities, suspicious for pneumonia, especially if febrile. Alternatively, this could represent asymmetric pulmonary edema or atelectasis. 2. Background of chronic interstitial lung disease. 3. Aortic  atherosclerosis. Electronically Signed   By: Franki Cabot M.D.   On: 02/09/2017 11:40     CBC  Recent Labs Lab 02/09/2017 1144 02/08/17 0046  WBC 6.3 6.2  HGB 7.1* 7.9*  HCT 23.6* 24.8*  PLT 265 250  MCV 94.3 92.8  MCH 28.5 29.6  MCHC 30.2* 31.9*  RDW 20.0* 17.9*  LYMPHSABS 0.6*  --   MONOABS 0.4  --   EOSABS 0.1  --   BASOSABS 0.0  --     Chemistries   Recent Labs Lab 02/11/2017 1144 02/19/2017 1430 02/16/2017 2013 02/08/17 0046 02/09/17 0533  NA 148* 149*  --  153* 154*  K 5.3* 5.7* 4.7 4.5 4.7  CL 116* 120*  --  125* 127*  CO2 18* 22  --  23 24  GLUCOSE 556* 493*  --  263* 310*  BUN 94* 91*  --  80* 64*  CREATININE 1.87* 1.77*  --  1.63* 1.43*  CALCIUM 8.5* 8.1*  --  8.0* 7.8*  MG 2.4  --   --   --   --   AST  --  39  --   --   --   ALT  --  28  --   --   --   ALKPHOS  --  382*  --   --   --   BILITOT  --  0.4  --   --   --    ------------------------------------------------------------------------------------------------------------------ estimated creatinine clearance is 42.5 mL/min (A) (by C-G formula based on SCr of 1.43 mg/dL (H)). ------------------------------------------------------------------------------------------------------------------  Recent Labs  02/05/2017 2013  HGBA1C 8.6*   ------------------------------------------------------------------------------------------------------------------ No results for input(s): CHOL, HDL, LDLCALC, TRIG, CHOLHDL, LDLDIRECT in the last 72 hours. ------------------------------------------------------------------------------------------------------------------ No results for input(s): TSH, T4TOTAL, T3FREE, THYROIDAB in the last 72 hours.  Invalid input(s): FREET3 ------------------------------------------------------------------------------------------------------------------ No results for input(s): VITAMINB12, FOLATE, FERRITIN, TIBC, IRON, RETICCTPCT in the last 72 hours.  Coagulation profile No results for  input(s): INR, PROTIME in the last 168 hours.  No results for input(s): DDIMER in the last 72 hours.  Cardiac Enzymes No results for input(s): CKMB, TROPONINI, MYOGLOBIN in the last 168 hours.  Invalid input(s): CK ------------------------------------------------------------------------------------------------------------------ Invalid input(s): POCBNP    Assessment & Plan   83 year old male with metastatic esophageal cancer status post PEG tube who presents with abnormal labs showing hyperglycemia.  1. Hyperglycemia without history of diabetes Improved with insulin therapy  2. Cough and acute hypoxic respiratory failure I will obtain a stat chest x-ray  I suspect the likelihood of aspiration pneumonia   3. Acute on chronic kidney disease stage III: Stable continue to monitor  4. Metastatic esophageal cancer with hospice and PEG feedings Prognosis very poor family understands that his things are getting worst in his disease is progressing Appreciate oncology input  5. acute on chronic anemia:  This is related to patient's esophageal cancer, as well as bone marrow suppression, chronic GI blood loss Patient also on eliquis which is currently being held   6. Lower extremity edema: Dopplers negative for DVT Last echocardiogram shows normal ejection fraction however does mention diastolic dysfunction This is related to severe hypoalbuminemia  7. History of PE/DVT  As held due to drop in hemoglobin   8. Atrial fibrillation : Continue  metoprolol eiliquis on hold  9. Chronic severe diarrhea C. Difficile negative, suspect related to tube feeds nutritionist to evaluate to see if another tube feed is appropriate for this patient continue antidiarrheals as well as Questran to see if that helps  10. History of CVA: Continue anticoagulation and Lipitor  11. Essential hypertension: Continue metoprolol  12. BPH: Continue finasteride and Hytrin     Code Status  Orders        Start     Ordered   02/16/2017 1942  Do not attempt resuscitation (DNR)  Continuous    Question Answer Comment  In the event of cardiac or respiratory ARREST Do not call a "code blue"   In the event of cardiac or respiratory ARREST Do not perform Intubation, CPR, defibrillation or ACLS   In the event of cardiac or respiratory ARREST Use medication by any route, position, wound care, and other measures to relive pain and suffering. May use oxygen, suction and manual treatment of airway obstruction as needed for comfort.      02/25/2017 1941    Code Status History    Date Active Date Inactive Code Status Order ID Comments User Context   02/13/2017  5:49 PM 02/17/2017  7:41 PM Full Code [id number]  Bettey Costa, MD ED   02/22/2017  5:38 PM 02/03/2017  5:49 PM DNR [id number]  Bettey Costa, MD ED   12/10/2016  3:11 PM 12/12/2016  9:04 PM Full Code [id number]  Gladstone Lighter, MD Inpatient   02/23/2016 10:36 PM 02/26/2016  3:22 PM Full Code [id number]  Toy Baker, MD Inpatient   12/24/2014  6:24 PM 12/26/2014  7:01 PM Full Code [id number]  Delfina Redwood, MD Inpatient           Consults  oncology  DVT Prophylaxis  scd's  Lab Results  Component Value Date   PLT 250 02/08/2017     Time Spent in minutes   61min Greater than 50% of time spent in care coordination and counseling patient regarding the condition and plan of care.   Dustin Flock M.D on 02/09/2017 at 2:03 PM  Between 7am to 6pm - Pager - [phone]  After 6pm go to [email or web address] - password [email or web address] [facility]   Office  [phone]

## 2017-02-09 NOTE — Clinical Social Work Note (Signed)
CSW saw in chart review that the patient's family wishes to take him home at discharge. RNCM aware. CSW signing off.  Santiago Bumpers, MSW, Latanya Presser [phone]

## 2017-02-09 NOTE — Progress Notes (Signed)
Inpatient Diabetes Program Recommendations  AACE/ADA: New Consensus Statement on Inpatient Glycemic Control (2015)  Target Ranges:  Prepandial:   less than 140 mg/dL      Peak postprandial:   less than 180 mg/dL (1-2 hours)      Critically ill patients:  140 - 180 mg/dL   Results for Marcus [name], Marcus [name] (MRN [id number]) as of 02/09/2017 10:33  Ref. Range 02/08/2017 07:41 02/08/2017 11:51 02/08/2017 16:30 02/08/2017 20:05 02/08/2017 23:44 02/09/2017 04:34 02/09/2017 07:53  Glucose-Capillary Latest Ref Range: 65 - 99 mg/dL 207 (H) 110 (H) 247 (H) 168 (H) 172 (H) 260 (H) 229 (H)   Diabetes history: No Outpatient Diabetes medications: NA Current orders for Inpatient glycemic control: Lantus 10 units QHS, Novolog 0-20 units TID with meals  Inpatient Diabetes Program Recommendations: Insulin-Basal: Please consider increasing Lantus to 12 units QHS. Insulin-Correction: Please consider decreasing Novolog correction scale to Moderate (0-15 units) and change frequency to Q4H since patient is NPO and receiving tube feedings. HgbA1C: A1C 8.6% on 02/12/2017. Per ADA if A1C is 6.5% or greater then criteria met to dx with DM. However, noted patient's hemoglobin was 7.1 g/dL on 02/09/2017 when A1C ran. Therefore, anticipate A1C is not completely accurate.   NOTE: Diabetes Coordinator is available by pager for questions/concerns over the weekend and is not physically on campus. Noted consult for hyperglycemia. In reviewing chart, noted patient has metastatic esophageal cancer, hemoglobin level 7.1 g/dL on 02/18/2017 when A1C ran, and patient started tube feedings about 2 months ago. Also noted, glucose was not elevated in July and patient is under hospice care. Question if patient has received any steroids over the past couple of months which could also be contributing to hyperglycemia. Anticipate tube feeding is main reason for hyperglycemia. Ordered Living Well with Diabetes booklet, insulin starter kit, and patient education by bedside  RNs. MD, please indicate in progress note if patient will be dx with DM at this time and what plan will be for glycemic control (will patient be discharged on insulin?).  Please inform bedside RNs of patient education needs so patient and family can be educated by bedside RNs. Diabetes Coordinator will plan to follow up with patient and family on Monday March 02, 2017.  Thanks, Barnie Alderman, RN, MSN, CDE Diabetes Coordinator Inpatient Diabetes Program [phone] (Team Pager from 8am to 5pm)

## 2017-02-09 NOTE — Progress Notes (Signed)
Marcus [name] at [facility]                                                                                                                                                                                  Patient Demographics   Marcus [name], is a 83 y.o. male, DOB - [birth year], TIR:[id number]  Admit date - 03/01/2017   Admitting Physician Bettey Costa, MD  Outpatient Primary MD for the patient is Chesley Noon, MD   LOS - 2  Subjective:  Respiratory therapist called me and with patient's oxygen saturations dropping into the 70s he is on high flow oxygen   Review of Systems:   CONSTITUTIONAL: unable to provide  Vitals:   Vitals:   02/09/17 1258 02/09/17 1307 02/09/17 1315 02/09/17 1350  BP: (!) 125/41     Pulse: 86 83 82   Resp: (!) 42  (!) 48   Temp: 100.1 F (37.8 C)     TempSrc: Oral     SpO2: 90% (!) 89% 90% 93%  Weight:      Height:        Wt Readings from Last 3 Encounters:  02/09/17 169 lb 3.2 oz (76.7 kg)  01/22/17 174 lb 12.8 oz (79.3 kg)  01/06/17 172 lb (78 kg)     Intake/Output Summary (Last 24 hours) at 02/09/17 1406 Last data filed at 02/09/17 1400  Gross per 24 hour  Intake             1586 ml  Output              551 ml  Net             1035 ml    Physical Exam:   GENERAL: critically ill-appearing HEAD, EYES, EARS, NOSE AND THROAT: Atraumatic, normocephalic.Pupils equal and reactive to light. Sclerae anicteric. No conjunctival injection. No oro-pharyngeal erythema.  NECK: Supple. There is no jugular venous distention. No bruits, no lymphadenopathy, no thyromegaly.  HEART: Regular rate and rhythm,. No murmurs, no rubs, no clicks.  LUNGS: bilateral rhonchus breath sounds bilaterally with accessory muscle usage ABDOMEN: Soft, flat, nontender, nondistended. Has good bowel sounds. No hepatosplenomegaly appreciated.  EXTREMITIES: No evidence of any cyanosis, clubbing, or peripheral edema.  +2 pedal and radial pulses  bilaterally.  NEUROLOGIC: not responded SKIN: Moist and warm with no rashes appreciated.  Psych: Not anxious, depressed LN: No inguinal LN enlargement    Antibiotics   Anti-infectives    Start     Dose/Rate Route Frequency Ordered Stop   02/09/17 1330  Ampicillin-Sulbactam (UNASYN) 3 g in sodium chloride 0.9 % 100 mL IVPB     3  g 200 mL/hr over 30 Minutes Intravenous Every 6 hours 02/09/17 1328     02/09/17 1200  levofloxacin (LEVAQUIN) tablet 500 mg  Status:  Discontinued     500 mg Per Tube Daily 02/09/17 1119 02/09/17 1214   02/08/2017 1715  vancomycin (VANCOCIN) IVPB 1000 mg/200 mL premix     1,000 mg 200 mL/hr over 60 Minutes Intravenous  Once 02/06/2017 1700     02/15/2017 1715  cephALEXin (KEFLEX) capsule 500 mg  Status:  Discontinued     500 mg Oral Every 8 hours 02/09/2017 1707 02/23/2017 1728      Medications   Scheduled Meds: . amLODipine  2.5 mg Per Tube Daily  . atorvastatin  20 mg Per Tube Q breakfast  . cholestyramine  4 g Oral QID  . diphenoxylate-atropine  2 tablet Per Tube QID  . dorzolamide  1 drop Both Eyes BID  . feeding supplement (PRO-STAT SUGAR FREE 64)  30 mL Per Tube Daily  . finasteride  5 mg Oral Daily  . free water  200 mL Per Tube QID  . insulin aspart  0-20 Units Subcutaneous TID WC  . insulin glargine  10 Units Subcutaneous QHS  . latanoprost  1 drop Both Eyes QHS  . metoprolol succinate  12.5 mg Oral Daily  . sodium chloride flush  3 mL Intravenous Q12H  . terazosin  10 mg Per Tube QHS  . timolol  1 drop Both Eyes BID  . vitamin B-12  500 mcg Per Tube Daily  . [START ON March 12, 2017] Vitamin D (Ergocalciferol)  50,000 Units Per Tube Q7 days   Continuous Infusions: . ampicillin-sulbactam (UNASYN) IV 3 g (02/09/17 1341)  . dextrose 75 mL/hr at 02/09/17 1253  . vancomycin     PRN Meds:.acetaminophen **OR** acetaminophen, diphenoxylate-atropine, heparin lock flush, ondansetron **OR** ondansetron (ZOFRAN) IV, senna-docusate, sodium chloride flush,  sodium chloride flush, sodium chloride flush   Data Review:   Micro Results Recent Results (from the past 240 hour(s))  Blood culture (routine x 2)     Status: None (Preliminary result)   Collection Time: 02/08/2017  5:16 PM  Result Value Ref Range Status   Specimen Description BLOOD BLOOD RIGHT ARM  Final   Special Requests   Final    BOTTLES DRAWN AEROBIC AND ANAEROBIC Blood Culture adequate volume   Culture NO GROWTH 2 DAYS  Final   Report Status PENDING  Incomplete  Blood culture (routine x 2)     Status: None (Preliminary result)   Collection Time: 02/08/2017  5:16 PM  Result Value Ref Range Status   Specimen Description BLOOD BLOOD LEFT ARM  Final   Special Requests   Final    BOTTLES DRAWN AEROBIC AND ANAEROBIC Blood Culture adequate volume   Culture NO GROWTH 2 DAYS  Final   Report Status PENDING  Incomplete  Gastrointestinal Panel by PCR , Stool     Status: None   Collection Time: 02/17/2017 11:59 PM  Result Value Ref Range Status   Campylobacter species NOT DETECTED NOT DETECTED Final   Plesimonas shigelloides NOT DETECTED NOT DETECTED Final   Salmonella species NOT DETECTED NOT DETECTED Final   Yersinia enterocolitica NOT DETECTED NOT DETECTED Final   Vibrio species NOT DETECTED NOT DETECTED Final   Vibrio cholerae NOT DETECTED NOT DETECTED Final   Enteroaggregative E coli (EAEC) NOT DETECTED NOT DETECTED Final   Enteropathogenic E coli (EPEC) NOT DETECTED NOT DETECTED Final   Enterotoxigenic E coli (ETEC) NOT DETECTED NOT DETECTED Final  Shiga like toxin producing E coli (STEC) NOT DETECTED NOT DETECTED Final   Shigella/Enteroinvasive E coli (EIEC) NOT DETECTED NOT DETECTED Final   Cryptosporidium NOT DETECTED NOT DETECTED Final   Cyclospora cayetanensis NOT DETECTED NOT DETECTED Final   Entamoeba histolytica NOT DETECTED NOT DETECTED Final   Giardia lamblia NOT DETECTED NOT DETECTED Final   Adenovirus F40/41 NOT DETECTED NOT DETECTED Final   Astrovirus NOT DETECTED  NOT DETECTED Final   Norovirus GI/GII NOT DETECTED NOT DETECTED Final   Rotavirus A NOT DETECTED NOT DETECTED Final   Sapovirus (I, II, IV, and V) NOT DETECTED NOT DETECTED Final  C difficile quick scan w PCR reflex     Status: None   Collection Time: 02/08/2017 11:59 PM  Result Value Ref Range Status   C Diff antigen NEGATIVE NEGATIVE Final   C Diff toxin NEGATIVE NEGATIVE Final   C Diff interpretation No C. difficile detected.  Final    Comment: VALID    Radiology Reports Dg Chest 1 View  Result Date: 03/01/2017 CLINICAL DATA:  Hyperglycemia.  Esophageal cancer. EXAM: CHEST 1 VIEW COMPARISON:  12/20/2016. FINDINGS: Interval borderline enlarged cardiac silhouette. Stable diffusely prominent interstitial markings and right jugular catheter. Small amount of linear density of both lung bases. Minimal right pleural effusion. Diffuse osteopenia. IMPRESSION: 1. Interval borderline cardiomegaly. 2. Minimal right pleural effusion, without significant change. 3. Stable chronic interstitial lung disease. 4. Minimal bibasilar linear atelectasis. Electronically Signed   By: Claudie Revering M.D.   On: 02/20/2017 18:01   US Venous Img Lower Unilateral Left  Result Date: 02/18/2017 CLINICAL DATA:  Left lower extremity pain and edema EXAM: Left LOWER EXTREMITY VENOUS DOPPLER ULTRASOUND TECHNIQUE: Gray-scale sonography with graded compression, as well as color Doppler and duplex ultrasound were performed to evaluate the lower extremity deep venous systems from the level of the common femoral vein and including the common femoral, femoral, profunda femoral, popliteal and calf veins including the posterior tibial, peroneal and gastrocnemius veins when visible. The superficial great saphenous vein was also interrogated. Spectral Doppler was utilized to evaluate flow at rest and with distal augmentation maneuvers in the common femoral, femoral and popliteal veins. COMPARISON:  None. FINDINGS: Contralateral Common Femoral  Vein: Respiratory phasicity is normal and symmetric with the symptomatic side. No evidence of thrombus. Normal compressibility. Common Femoral Vein: No evidence of thrombus. Normal compressibility, respiratory phasicity and response to augmentation. Saphenofemoral Junction: No evidence of thrombus. Normal compressibility and flow on color Doppler imaging. Profunda Femoral Vein: No evidence of thrombus. Normal compressibility and flow on color Doppler imaging. Femoral Vein: No evidence of thrombus. Normal compressibility, respiratory phasicity and response to augmentation. Popliteal Vein: No evidence of thrombus. Normal compressibility, respiratory phasicity and response to augmentation. Calf Veins: No evidence of thrombus. Normal compressibility and flow on color Doppler imaging. Other Findings:  None. IMPRESSION: No evidence of DVT within the left lower extremity. Electronically Signed   By: Donavan Foil M.D.   On: 02/02/2017 15:37   Dg Chest Port 1 View  Result Date: 02/09/2017 CLINICAL DATA:  Shortness of breath EXAM: PORTABLE CHEST 1 VIEW COMPARISON:  Chest x-rays dated 02/05/2017 and 12/20/2016. FINDINGS: Patchy ill-defined airspace opacities bilaterally, lower lobe predominant, new compared to previous exams suggesting developing pneumonia or less likely asymmetric pulmonary edema. Background of coarse interstitial markings again noted indicating chronic interstitial lung disease. No pleural effusion or pneumothorax seen. Heart size and mediastinal contours are stable. Atherosclerotic changes noted at the aortic arch. Right chest wall  Port-A-Cath is stable in position. No acute or suspicious osseous abnormality. IMPRESSION: 1. New bibasilar airspace opacities, suspicious for pneumonia, especially if febrile. Alternatively, this could represent asymmetric pulmonary edema or atelectasis. 2. Background of chronic interstitial lung disease. 3. Aortic atherosclerosis. Electronically Signed   By: Franki Cabot  M.D.   On: 02/09/2017 11:40     CBC  Recent Labs Lab 02/05/2017 1144 02/08/17 0046  WBC 6.3 6.2  HGB 7.1* 7.9*  HCT 23.6* 24.8*  PLT 265 250  MCV 94.3 92.8  MCH 28.5 29.6  MCHC 30.2* 31.9*  RDW 20.0* 17.9*  LYMPHSABS 0.6*  --   MONOABS 0.4  --   EOSABS 0.1  --   BASOSABS 0.0  --     Chemistries   Recent Labs Lab 02/16/2017 1144 02/06/2017 1430 02/03/2017 2013 02/08/17 0046 02/09/17 0533  NA 148* 149*  --  153* 154*  K 5.3* 5.7* 4.7 4.5 4.7  CL 116* 120*  --  125* 127*  CO2 18* 22  --  23 24  GLUCOSE 556* 493*  --  263* 310*  BUN 94* 91*  --  80* 64*  CREATININE 1.87* 1.77*  --  1.63* 1.43*  CALCIUM 8.5* 8.1*  --  8.0* 7.8*  MG 2.4  --   --   --   --   AST  --  39  --   --   --   ALT  --  28  --   --   --   ALKPHOS  --  382*  --   --   --   BILITOT  --  0.4  --   --   --    ------------------------------------------------------------------------------------------------------------------ estimated creatinine clearance is 42.5 mL/min (A) (by C-G formula based on SCr of 1.43 mg/dL (H)). ------------------------------------------------------------------------------------------------------------------  Recent Labs  02/28/2017 2013  HGBA1C 8.6*   ------------------------------------------------------------------------------------------------------------------ No results for input(s): CHOL, HDL, LDLCALC, TRIG, CHOLHDL, LDLDIRECT in the last 72 hours. ------------------------------------------------------------------------------------------------------------------ No results for input(s): TSH, T4TOTAL, T3FREE, THYROIDAB in the last 72 hours.  Invalid input(s): FREET3 ------------------------------------------------------------------------------------------------------------------ No results for input(s): VITAMINB12, FOLATE, FERRITIN, TIBC, IRON, RETICCTPCT in the last 72 hours.  Coagulation profile No results for input(s): INR, PROTIME in the last 168 hours.  No  results for input(s): DDIMER in the last 72 hours.  Cardiac Enzymes No results for input(s): CKMB, TROPONINI, MYOGLOBIN in the last 168 hours.  Invalid input(s): CK ------------------------------------------------------------------------------------------------------------------ Invalid input(s): POCBNP    Assessment & Plan   83 year old male with metastatic esophageal cancer status post PEG tube who presents with abnormal labs showing hyperglycemia.  1. Acute hypoxic respiratory failure This is due to bilateral pneumonia I have started him on IV antibiotics Patient with metastatic esophageal cancer I have discussed with his wife regarding overall prognosis and decline in his condition She is agreeable to keeping him on the floor here Continuing current therapy We will not place patient on BiPAP She understands that this is a terminal condition and patient may pass away soon For extreme respiratory distress she would like me to use morphine Otherwise she would like to continue all the other treatment     Code Status Orders        Start     Ordered   02/19/2017 1942  Do not attempt resuscitation (DNR)  Continuous    Question Answer Comment  In the event of cardiac or respiratory ARREST Do not call a "code blue"   In the event of cardiac or  respiratory ARREST Do not perform Intubation, CPR, defibrillation or ACLS   In the event of cardiac or respiratory ARREST Use medication by any route, position, wound care, and other measures to relive pain and suffering. May use oxygen, suction and manual treatment of airway obstruction as needed for comfort.      02/08/2017 1941    Code Status History    Date Active Date Inactive Code Status Order ID Comments User Context   02/15/2017  5:49 PM 02/24/2017  7:41 PM Full Code [id number]  Bettey Costa, MD ED   02/27/2017  5:38 PM 02/18/2017  5:49 PM DNR [id number]  Bettey Costa, MD ED   12/10/2016  3:11 PM 12/12/2016  9:04 PM Full Code [id number]   Gladstone Lighter, MD Inpatient   02/23/2016 10:36 PM 02/26/2016  3:22 PM Full Code [id number]  Toy Baker, MD Inpatient   12/24/2014  6:24 PM 12/26/2014  7:01 PM Full Code [id number]  Delfina Redwood, MD Inpatient           Consults  oncology  DVT Prophylaxis  scd's  Lab Results  Component Value Date   PLT 250 02/08/2017     Time Spent in minutes   51min critical care time spent in addition to her earlier visit  Dustin Flock M.D on 02/09/2017 at 2:06 PM  Between 7am to 6pm - Pager - [phone]  After 6pm go to [email or web address] - password [email or web address] [facility]   Office  [phone]

## 2017-02-09 NOTE — Progress Notes (Signed)
Pharmacy Antibiotic Note  Marcus [name] is a 83 y.o. male admitted on 03/01/2017 with pneumonia.  Pharmacy has been consulted for unasyn dosing.  Plan: unasyn 3gm iv q6h for aspiration pneumonia   Height: 6\' 2"  (188 cm) Weight: 169 lb 3.2 oz (76.7 kg) IBW/kg (Calculated) : 82.2  Temp (24hrs), Avg:98.7 F (37.1 C), Min:98 F (36.7 C), Max:100.1 F (37.8 C)   Recent Labs Lab 02/27/2017 1144 02/25/2017 1430 02/08/17 0046 02/09/17 0533  WBC 6.3  --  6.2  --   CREATININE 1.87* 1.77* 1.63* 1.43*    Estimated Creatinine Clearance: 42.5 mL/min (A) (by C-G formula based on SCr of 1.43 mg/dL (H)).    Allergies  Allergen Reactions  . Codeine Other (See Comments) and Anaphylaxis    Swollen tongue, numb around mouth, slurred speech Swollen tongue, numb around mouth, slurred speech  . Allopurinol Other (See Comments)    Hand pain  . Simvastatin Other (See Comments)    myalgias  . Uloric [Febuxostat] Other (See Comments)    Stomach pain    Antimicrobials this admission: 9/9 unasyn >>  9/8 levaquin >>  Microbiology results: Recent Results (from the past 240 hour(s))  Blood culture (routine x 2)     Status: None (Preliminary result)   Collection Time: 02/24/2017  5:16 PM  Result Value Ref Range Status   Specimen Description BLOOD BLOOD RIGHT ARM  Final   Special Requests   Final    BOTTLES DRAWN AEROBIC AND ANAEROBIC Blood Culture adequate volume   Culture NO GROWTH 2 DAYS  Final   Report Status PENDING  Incomplete  Blood culture (routine x 2)     Status: None (Preliminary result)   Collection Time: 02/22/2017  5:16 PM  Result Value Ref Range Status   Specimen Description BLOOD BLOOD LEFT ARM  Final   Special Requests   Final    BOTTLES DRAWN AEROBIC AND ANAEROBIC Blood Culture adequate volume   Culture NO GROWTH 2 DAYS  Final   Report Status PENDING  Incomplete  Gastrointestinal Panel by PCR , Stool     Status: None   Collection Time: 02/11/2017 11:59 PM  Result Value Ref  Range Status   Campylobacter species NOT DETECTED NOT DETECTED Final   Plesimonas shigelloides NOT DETECTED NOT DETECTED Final   Salmonella species NOT DETECTED NOT DETECTED Final   Yersinia enterocolitica NOT DETECTED NOT DETECTED Final   Vibrio species NOT DETECTED NOT DETECTED Final   Vibrio cholerae NOT DETECTED NOT DETECTED Final   Enteroaggregative E coli (EAEC) NOT DETECTED NOT DETECTED Final   Enteropathogenic E coli (EPEC) NOT DETECTED NOT DETECTED Final   Enterotoxigenic E coli (ETEC) NOT DETECTED NOT DETECTED Final   Shiga like toxin producing E coli (STEC) NOT DETECTED NOT DETECTED Final   Shigella/Enteroinvasive E coli (EIEC) NOT DETECTED NOT DETECTED Final   Cryptosporidium NOT DETECTED NOT DETECTED Final   Cyclospora cayetanensis NOT DETECTED NOT DETECTED Final   Entamoeba histolytica NOT DETECTED NOT DETECTED Final   Giardia lamblia NOT DETECTED NOT DETECTED Final   Adenovirus F40/41 NOT DETECTED NOT DETECTED Final   Astrovirus NOT DETECTED NOT DETECTED Final   Norovirus GI/GII NOT DETECTED NOT DETECTED Final   Rotavirus A NOT DETECTED NOT DETECTED Final   Sapovirus (I, II, IV, and V) NOT DETECTED NOT DETECTED Final  C difficile quick scan w PCR reflex     Status: None   Collection Time: 03/01/2017 11:59 PM  Result Value Ref Range Status  C Diff antigen NEGATIVE NEGATIVE Final   C Diff toxin NEGATIVE NEGATIVE Final   C Diff interpretation No C. difficile detected.  Final    Comment: VALID    Thank you for allowing pharmacy to be a part of this patient's care.  Donna Christen Less [place] 02/09/2017 1:28 PM

## 2017-02-09 NOTE — Plan of Care (Signed)
Problem: Safety: Goal: Ability to remain free from injury will improve Outcome: Progressing Pt is high fall risk with yellow arm band and yellow socks on. Bed alarm is on.  Problem: Nutrition: Goal: Adequate nutrition will be maintained Outcome: Progressing Tube feed continues, pt tolerating.

## 2017-02-09 NOTE — Progress Notes (Addendum)
Pt has declining condition with respiratory changes with tachynea, low grade fever, congested cough/lung sounds. More lethargic with increased confusion. Unable to get OOB today due to weakness with urinal use in the bed. Tube feedings stopped. IVF's started per MD order. Unasyn IV started for indicated pneumonia on chest xray. Respiration consulted with pt requiring high flow 02 due to low pulse ox despite increased 02 flow. Wife remains at bedside with MD communicating with her on pt condition and plan of treatment. Declined spiritual support. Wife gave prepared insulin injection at lunchtime with verbal teaching.

## 2017-02-10 LAB — GLUCOSE, CAPILLARY: Glucose-Capillary: 126 mg/dL — ABNORMAL HIGH (ref 65–99)

## 2017-02-12 ENCOUNTER — Inpatient Hospital Stay

## 2017-02-12 ENCOUNTER — Inpatient Hospital Stay: Admitting: Internal Medicine

## 2017-02-12 LAB — BLOOD GAS, VENOUS
Acid-base deficit: 3.3 mmol/L — ABNORMAL HIGH (ref 0.0–2.0)
Bicarbonate: 22.1 mmol/L (ref 20.0–28.0)
O2 SAT: 61.8 %
PATIENT TEMPERATURE: 37
PH VEN: 7.35 (ref 7.250–7.430)
PO2 VEN: 34 mmHg (ref 32.0–45.0)
pCO2, Ven: 40 mmHg — ABNORMAL LOW (ref 44.0–60.0)

## 2017-02-12 LAB — CULTURE, BLOOD (ROUTINE X 2)
Culture: NO GROWTH
Culture: NO GROWTH
Special Requests: ADEQUATE
Special Requests: ADEQUATE

## 2017-03-03 NOTE — Progress Notes (Signed)
Patient fsbs down to 69. 90ml of D50 given per hypoglycemia protocol. Scheduled medications held due to patients condition.

## 2017-03-03 NOTE — Progress Notes (Signed)
Patient found unresponsive with no respirations, no palpable pulse, pupils fixed. Patient pronounced by Silva Bandy, RN and verified by Mariane Masters, RN at [phone].

## 2017-03-03 NOTE — Progress Notes (Signed)
Patient expired at 7. Dr. Marcille Blanco notified of death. Lelon Frohlich, [facility] also notified and in to speak with family. [facility] paged and awaiting response currently. [facility] [employer] notified referral number [id number] with a full release to funeral home.  Family is at bedside.

## 2017-03-03 NOTE — Death Summary Note (Signed)
[place] at [facility] Date of Admission: 2017/02/23  2:08 PM  Date of death:2017/02/26   Admitting diagnosis:   Elevated blood glucose  Diagnosis at time of death 1.acute hypoxic respiratory failure 2.aspiration pneumonia 3.metastatic esophageal cancer 4.hyperglycemia related to tube feeds 5. Acute on chronic kidney disease stage III 6. Acute on chronic anemia with likely upper GI bleed related to esophageal cancer 7. lower extremity edema with severe hypoalbuminemia 8. History of PE and DVT 9. Hypernatremia 10. Previous history of CVA  Hospital course Marcus [name]  is a 83 y.o. male with a known history of Metastatic esophageal cancer with poor performance status no longer a candidate for further standard chemotherapy, gout and history of CVA who presents today to the emergency room due to hyperglycemia. Patient had a follow-up with Dr. Donella Stade today and went to get labs drawn. It was noted that patient's blood sugars were in the 500 range so he sent to the ER for further evaluation. Patient has been on tube feeds. He was admitted for further treatment for his hyperglycemia. He was also noticed to have acute on chronic kidney disease. He was hydrated. Patient also was noted to have hypernatremia due to free water deficit. Patient was being treated for these. He also was noted to have anemia with drop in his hemoglobin. Felt to be due to slow GI loss from his esophageal cancer. Patient was admitted to the hospital and treated aggressively he started developing respiratory difficulties. Chest x-ray revealed he had pneumonia. Patient was started on antibiotics. He had done poorly over the past few months and discussions were held with the family. It was decided to continue high flow oxygen but not transfer to the ICU on BiPAP. He was  Already DO NOT RESUSCITATE so he was not intubated. Patient was treated and he passed away on 27-Feb-2023. Family was  notified.          TOTAL TIME TAKING CARE OF THIS PATIENT: 25 minutes.    Dustin Flock M.D on 2017/02/26 at 7:58 AM  Between 7am to 6pm - Pager - [phone]  After 6pm go to [email or web address] - password [email or web address]  [facility]  Office  [phone]  CC: Primary care physician; Chesley Noon, MD

## 2017-03-03 DEATH — deceased

## 2017-03-14 ENCOUNTER — Other Ambulatory Visit: Payer: Self-pay | Admitting: Nurse Practitioner

## 2017-07-28 ENCOUNTER — Ambulatory Visit: Payer: Medicare Other | Admitting: Radiation Oncology

## 2018-02-12 IMAGING — CT NM PET TUM IMG INITIAL (PI) SKULL BASE T - THIGH
1 of 10 series · 1 of 25 positions shown · non-contrast
Comparison: None.

CLINICAL DATA: Initial treatment strategy for esophageal carcinoma.

EXAM:
NUCLEAR MEDICINE PET SKULL BASE TO THIGH
TECHNIQUE: 12.45 mCi F-18 FDG was injected intravenously. Full-ring PET imaging
was performed from the skull base to thigh after the radiotracer. CT
data was obtained and used for attenuation correction and anatomic
localization.
FASTING BLOOD GLUCOSE:  Value: 113 mg/dl

[Series 3: ct wb 5.0 b30f · axial · 5.0mm · 0.98mm/px · 1 of 329 slices shown]
[im 329/329  brain]
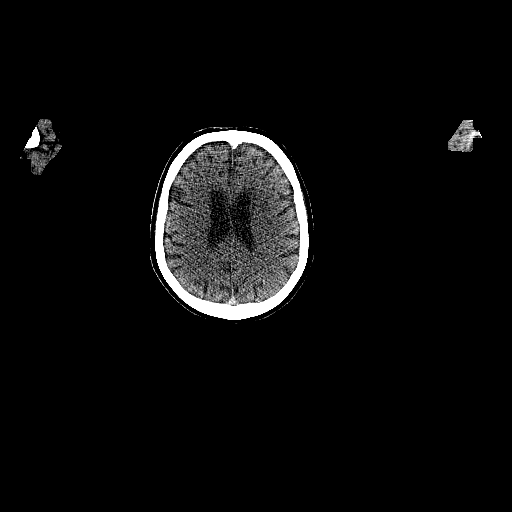

[1 of 25 positions shown; findings below may reference images not displayed]

FINDINGS: NECK

No hypermetabolic lymph nodes in the neck.

CHEST

No hypermetabolic mediastinal or hilar nodes. No suspicious
pulmonary nodules on the CT scan. Malignant range FDG uptake is
associated with the distal esophageal mass. This mass measures
approximately 3.4 x 3.0 x 4.5 cm and has an SUV max equal to 8.64.
Indeterminate nodular density within the left lower lobe measures
2.3 x 0.9 cm and has an SUV max equal to 1.49.

ABDOMEN/PELVIS

No abnormal hypermetabolic activity within the liver, pancreas,
adrenal glands, or spleen. No hypermetabolic lymph nodes in the
abdomen or pelvis.

SKELETON

No focal hypermetabolic activity to suggest skeletal metastasis.
There is a focal area of increased uptake localizing to the left
12th costovertebral junction. There are corresponding degenerative
changes on the CT images, image 150 of series 3.
IMPRESSION: 1. Intense FDG uptake is associated with the distal esophageal mass
compatible with primary esophageal carcinoma.
2. No hypermetabolic mediastinal or upper abdominal adenopathy.
3. There is a indeterminate nodular density within the left lower
lobe which exhibits borderline FDG uptake. This may be inflammatory
or infectious. Metastatic disease less favored. Recommend
three-month follow-up examination with repeat CT of the chest.
4. Increased uptake localizes to the left twelfth costovertebral
junction. No corresponding suspicious bone lesion. This is favored
to represent arthropathic change.
